# Patient Record
Sex: Female | Born: 1965 | Race: White | Hispanic: No | Marital: Married | Smoking: Never smoker
Health system: Southern US, Community
[De-identification: ages and names within clinical notes are randomized; demographics above are authoritative.]

## PROBLEM LIST (undated history)

## (undated) DIAGNOSIS — E785 Hyperlipidemia, unspecified: Secondary | ICD-10-CM

## (undated) DIAGNOSIS — A419 Sepsis, unspecified organism: Secondary | ICD-10-CM

## (undated) DIAGNOSIS — R32 Unspecified urinary incontinence: Secondary | ICD-10-CM

## (undated) DIAGNOSIS — F32A Depression, unspecified: Secondary | ICD-10-CM

## (undated) DIAGNOSIS — N3010 Interstitial cystitis (chronic) without hematuria: Secondary | ICD-10-CM

## (undated) DIAGNOSIS — F419 Anxiety disorder, unspecified: Secondary | ICD-10-CM

## (undated) DIAGNOSIS — F329 Major depressive disorder, single episode, unspecified: Secondary | ICD-10-CM

## (undated) DIAGNOSIS — M545 Low back pain, unspecified: Secondary | ICD-10-CM

## (undated) DIAGNOSIS — IMO0002 Reserved for concepts with insufficient information to code with codable children: Secondary | ICD-10-CM

## (undated) DIAGNOSIS — G43909 Migraine, unspecified, not intractable, without status migrainosus: Secondary | ICD-10-CM

## (undated) DIAGNOSIS — A499 Bacterial infection, unspecified: Secondary | ICD-10-CM

## (undated) DIAGNOSIS — Z8759 Personal history of other complications of pregnancy, childbirth and the puerperium: Secondary | ICD-10-CM

## (undated) DIAGNOSIS — N390 Urinary tract infection, site not specified: Secondary | ICD-10-CM

## (undated) DIAGNOSIS — M1990 Unspecified osteoarthritis, unspecified site: Secondary | ICD-10-CM

## (undated) HISTORY — PX: LASIK: SHX215

## (undated) HISTORY — DX: Major depressive disorder, single episode, unspecified: F32.9

## (undated) HISTORY — DX: Reserved for concepts with insufficient information to code with codable children: IMO0002

## (undated) HISTORY — DX: Interstitial cystitis (chronic) without hematuria: N30.10

## (undated) HISTORY — DX: Low back pain: M54.5

## (undated) HISTORY — DX: Urinary tract infection, site not specified: N39.0

## (undated) HISTORY — DX: Unspecified urinary incontinence: R32

## (undated) HISTORY — DX: Personal history of other complications of pregnancy, childbirth and the puerperium: Z87.59

## (undated) HISTORY — DX: Migraine, unspecified, not intractable, without status migrainosus: G43.909

## (undated) HISTORY — PX: DILATION AND CURETTAGE OF UTERUS: SHX78

## (undated) HISTORY — DX: Unspecified osteoarthritis, unspecified site: M19.90

## (undated) HISTORY — DX: Anxiety disorder, unspecified: F41.9

## (undated) HISTORY — PX: WISDOM TOOTH EXTRACTION: SHX21

## (undated) HISTORY — PX: REDUCTION MAMMAPLASTY: SUR839

## (undated) HISTORY — DX: Hyperlipidemia, unspecified: E78.5

## (undated) HISTORY — DX: Low back pain, unspecified: M54.50

## (undated) HISTORY — DX: Depression, unspecified: F32.A

## (undated) HISTORY — DX: Bacterial infection, unspecified: A49.9

---
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
## 1984-06-22 HISTORY — PX: BREAST SURGERY: SHX581

## 1988-06-22 HISTORY — PX: CERVICAL CONE BIOPSY: SUR198

## 1997-08-03 ENCOUNTER — Inpatient Hospital Stay (HOSPITAL_COMMUNITY): Admission: AD | Admit: 1997-08-03 | Discharge: 1997-08-04 | Payer: Self-pay | Admitting: Obstetrics and Gynecology

## 1998-01-03 ENCOUNTER — Other Ambulatory Visit: Admission: RE | Admit: 1998-01-03 | Discharge: 1998-01-03 | Payer: Self-pay | Admitting: Obstetrics and Gynecology

## 1998-07-04 ENCOUNTER — Encounter: Payer: Self-pay | Admitting: Obstetrics and Gynecology

## 1998-07-04 ENCOUNTER — Ambulatory Visit (HOSPITAL_COMMUNITY): Admission: RE | Admit: 1998-07-04 | Discharge: 1998-07-04 | Payer: Self-pay | Admitting: Obstetrics and Gynecology

## 1999-01-10 ENCOUNTER — Ambulatory Visit (HOSPITAL_COMMUNITY): Admission: RE | Admit: 1999-01-10 | Discharge: 1999-01-10 | Payer: Self-pay | Admitting: Gastroenterology

## 1999-01-17 ENCOUNTER — Other Ambulatory Visit: Admission: RE | Admit: 1999-01-17 | Discharge: 1999-01-17 | Payer: Self-pay | Admitting: Obstetrics and Gynecology

## 1999-01-19 ENCOUNTER — Encounter: Payer: Self-pay | Admitting: Obstetrics & Gynecology

## 1999-01-19 ENCOUNTER — Ambulatory Visit (HOSPITAL_COMMUNITY): Admission: RE | Admit: 1999-01-19 | Discharge: 1999-01-19 | Payer: Self-pay | Admitting: Obstetrics & Gynecology

## 1999-06-23 HISTORY — PX: LAPAROSCOPY: SHX197

## 1999-06-23 HISTORY — PX: HYSTEROSCOPY: SHX211

## 1999-08-14 ENCOUNTER — Encounter (INDEPENDENT_AMBULATORY_CARE_PROVIDER_SITE_OTHER): Payer: Self-pay

## 1999-08-14 ENCOUNTER — Ambulatory Visit (HOSPITAL_COMMUNITY): Admission: RE | Admit: 1999-08-14 | Discharge: 1999-08-14 | Payer: Self-pay | Admitting: Obstetrics & Gynecology

## 2000-10-07 ENCOUNTER — Other Ambulatory Visit: Admission: RE | Admit: 2000-10-07 | Discharge: 2000-10-07 | Payer: Self-pay | Admitting: Obstetrics and Gynecology

## 2000-10-20 ENCOUNTER — Encounter: Admission: RE | Admit: 2000-10-20 | Discharge: 2000-10-20 | Payer: Self-pay | Admitting: Obstetrics and Gynecology

## 2000-10-20 ENCOUNTER — Encounter: Payer: Self-pay | Admitting: Obstetrics and Gynecology

## 2001-01-03 ENCOUNTER — Encounter (INDEPENDENT_AMBULATORY_CARE_PROVIDER_SITE_OTHER): Payer: Self-pay | Admitting: Specialist

## 2001-01-03 ENCOUNTER — Other Ambulatory Visit: Admission: RE | Admit: 2001-01-03 | Discharge: 2001-01-03 | Payer: Self-pay | Admitting: Obstetrics and Gynecology

## 2001-01-05 ENCOUNTER — Encounter (INDEPENDENT_AMBULATORY_CARE_PROVIDER_SITE_OTHER): Payer: Self-pay

## 2001-01-05 ENCOUNTER — Ambulatory Visit (HOSPITAL_COMMUNITY): Admission: RE | Admit: 2001-01-05 | Discharge: 2001-01-05 | Payer: Self-pay | Admitting: Obstetrics and Gynecology

## 2001-04-26 ENCOUNTER — Encounter: Payer: Self-pay | Admitting: Internal Medicine

## 2001-04-26 ENCOUNTER — Encounter: Admission: RE | Admit: 2001-04-26 | Discharge: 2001-04-26 | Payer: Self-pay | Admitting: Internal Medicine

## 2001-06-22 HISTORY — PX: TUBAL LIGATION: SHX77

## 2001-07-26 ENCOUNTER — Other Ambulatory Visit: Admission: RE | Admit: 2001-07-26 | Discharge: 2001-07-26 | Payer: Self-pay | Admitting: Obstetrics and Gynecology

## 2001-12-29 ENCOUNTER — Encounter (HOSPITAL_COMMUNITY): Admission: AD | Admit: 2001-12-29 | Discharge: 2002-01-28 | Payer: Self-pay | Admitting: Obstetrics and Gynecology

## 2002-01-05 ENCOUNTER — Inpatient Hospital Stay (HOSPITAL_COMMUNITY): Admission: AD | Admit: 2002-01-05 | Discharge: 2002-01-05 | Payer: Self-pay | Admitting: Obstetrics and Gynecology

## 2002-01-20 ENCOUNTER — Encounter: Payer: Self-pay | Admitting: *Deleted

## 2002-01-20 ENCOUNTER — Inpatient Hospital Stay (HOSPITAL_COMMUNITY): Admission: AD | Admit: 2002-01-20 | Discharge: 2002-01-20 | Payer: Self-pay | Admitting: *Deleted

## 2002-02-02 ENCOUNTER — Inpatient Hospital Stay (HOSPITAL_COMMUNITY): Admission: AD | Admit: 2002-02-02 | Discharge: 2002-02-05 | Payer: Self-pay | Admitting: Obstetrics and Gynecology

## 2002-02-02 ENCOUNTER — Encounter (INDEPENDENT_AMBULATORY_CARE_PROVIDER_SITE_OTHER): Payer: Self-pay | Admitting: Specialist

## 2002-03-06 ENCOUNTER — Other Ambulatory Visit: Admission: RE | Admit: 2002-03-06 | Discharge: 2002-03-06 | Payer: Self-pay | Admitting: Obstetrics and Gynecology

## 2003-04-17 ENCOUNTER — Other Ambulatory Visit: Admission: RE | Admit: 2003-04-17 | Discharge: 2003-04-17 | Payer: Self-pay | Admitting: Obstetrics and Gynecology

## 2008-08-13 ENCOUNTER — Other Ambulatory Visit: Admission: RE | Admit: 2008-08-13 | Discharge: 2008-08-13 | Payer: Self-pay | Admitting: Family Medicine

## 2008-12-04 ENCOUNTER — Encounter: Admission: RE | Admit: 2008-12-04 | Discharge: 2008-12-04 | Payer: Self-pay | Admitting: Family Medicine

## 2010-01-21 ENCOUNTER — Encounter: Admission: RE | Admit: 2010-01-21 | Discharge: 2010-01-21 | Payer: Self-pay | Admitting: Obstetrics and Gynecology

## 2010-01-22 LAB — LIPID PANEL
CHOL/HDL RATIO, SERUM: 4
Cholesterol: 192 mg/dL (ref 0–200)
HDL: 48 mg/dL (ref 35–70)
LDL CALC: 128 mg/dL
Triglycerides: 82

## 2010-01-22 LAB — COMPLETE METABOLIC PANEL WITH GFR
ALBUMIN: 4.2
ALK PHOS: 98 U/L
ALT: 20 U/L (ref 7–35)
AST: 16 U/L
Albumin/Glob SerPl: 1.4
BILIRUBIN TOTAL: 0.5 mg/dL
BUN: 15 mg/dL (ref 4–21)
CARBON MONOXIDE, BLOOD: 25
CREATININE: 0.88
Calcium: 9 mg/dL
Chloride: 105 mmol/L
GFR, EST AFRICAN AMERICAN: 92
GFR, Est Non African American: 79
GLOBULIN: 3
Glucose: 90
Potassium: 4.2 mmol/L
Sodium: 139 mmol/L (ref 137–147)
Total Protein: 7.2 g/dL

## 2010-11-07 NOTE — Op Note (Signed)
[facility]  Patient:    Brenda Foster, Brenda Foster                      MRN: [id number] Proc. Date: 08/14/99 Adm. Date:  [id number] Attending:  Genia Del                           Operative Report  PREOPERATIVE DIAGNOSIS:       Repeat (second) missed abortion at 12+ weeks by last menstrual period, 9+ weeks by ultrasound today.  POSTOPERATIVE DIAGNOSIS:      Repeat (second) missed abortion at 12+ weeks by last menstrual period, 9+ weeks by ultrasound today.  OPERATION:                    Dilatation and evacuation by aspiration and curettage.  SURGEON:                      Genia Del, M.D.  ASSISTANT:  ANESTHESIA:                   Belva Agee, M.D.  ESTIMATED BLOOD LOSS:  DESCRIPTION OF PROCEDURE:     Under MAC the patient is in the lithotomy position. She is prepped with Betadine on the suprapubic, vulvar, and vaginal areas.  A bladder catheterization is done and the patient is draped as usual.  The vaginal examination reveals a closed cervix, long and firm.  The uterus is retroverted corresponding to about [redacted] weeks gestation.  No adnexal mass.  The speculum is introduced.  A paracervical block is done with lidocaine 1% at the level of the  uterosacral ligaments and at 4 and 8 oclock.  A total of 20 cc was injected. Then the Pozzi clamp is applied on the anterior lip of the cervix.  The dilatation is done easily with Hegar dilators up to #31.  The hysterometry was done before dilation and measured 10 cm.  Then we introduced the aspiration curet #10 curved. Curetting of the endometrial cavity brings back products of conception corresponding to about [redacted] weeks gestation.  Those are sent to genetics and pathology.  We then proceed to a systematic curettage with a sharp curet on all  endometrial surfaces.  We then finish by doing a last aspiration of the uterine  cavity.  The uterine sound is heard all over.  No more products of  conception are coming back and hemostasis is good.  The instruments are therefore removed. The vaginal examination after the procedure reveals a well-contracted uterus.  The estimated blood loss is about 50 cc.  No complications occurred.  The patients blood group was O positive, antibodies negative.  The patient was brought to the recovery room in good status. DD:  08/14/99 TD:  08/14/99 Job: [id number]

## 2010-11-07 NOTE — Op Note (Signed)
NAME:  Brenda Foster, Brenda Foster                       ACCOUNT NO.:  [id number]   MEDICAL RECORD NO.:  [id number]                   PATIENT TYPE:  INP   LOCATION:  9132                                 FACILITY:  [facility]   PHYSICIAN:  Sheronette A. Cherly Hensen, M.D.         DATE OF BIRTH:  01-08-1966   DATE OF PROCEDURE:  DATE OF DISCHARGE:                                 OPERATIVE REPORT   PREOPERATIVE DIAGNOSES:  1. Spontaneous rupture of membranes.  2. Twin gestation at 37 weeks.  3. Malpresentation of second twin.  4. Desires sterilization.   PROCEDURE:  1. Primary cesarean section.  2. Sharl Ma hysterotomy.  3. Modified Pomeroy tubal ligation.   POSTOPERATIVE DIAGNOSES:  1. Spontaneous rupture of membranes.  2. Vertex transverse presentation.  3. Desires sterilization.  4. Twin gestation at 37 weeks.   ANESTHESIA:  Spinal.   SURGEON:  Sheronette A. Cherly Hensen, M.D.   ASSISTANT:  Genia Del, M.D.   INDICATIONS FOR PROCEDURE:  This is a 45 year old  gravida 6, para 0-0-5-0  female at 62 weeks with twin gestation who presented with spontaneous  rupture of membranes and very early labor.  On ultrasound in maturity  admission the patient  was found to have a vertex and a transverse position  of the fetuses. Given the nonvertex presentation of the second twin decision  was made to proceed with a primary cesarean section. The patient also  desires permanent sterilization. Risks and benefits of the procedure have  been explained to the patient. Consent was signed. The patient  was  transferred to the operating room.   PROCEDURE:  Under adequate spinal anesthesia the patient was placed into the  supine position with a left lateral tilt. An indwelling Foley catheter was  placed sterilely. The patient was prepped and draped in the usual fashion. 8  cc of 0.25% Marcaine was injected along the planned Pfannenstiel incision. A  Pfannenstiel skin incision was then made and carried down to the  rectus  fascia using Bovie cautery. The rectus fascia was incised in midline and  extended bilaterally. The rectus fascia entered bluntly and with cautery  dissected off the rectus muscle in a superior and inferior fashion.  The  rectus muscles were spread in midline. The parietal peritoneum was entered  blunted and extended superiorly and inferiorly. The vesicouterine peritoneum  was then opened and extended transversely. The bladder was then bluntly  dissected off the lower uterine segment and removed from the operative field  using a Doyen retractor. A curvilinear low transverse uterine incision was  then made and extended bilaterally using bandage scissors. A clear amniotic  sac was noted and through that sac delivery of the first baby who is a live  female was accomplished. He was bulb suctioned in the abdomen and cord around  the neck times one was reduced and subsequently the baby was delivered. The  cord was clamped and cut. The baby was transferred to  the awaiting  pediatrician with Apgars of 8 and 9 at one and five minutes. His subsequent  weight was 7 pound 3 ounces. The second baby with intact membranes with  palpation was found to be lying on her left side and sort of tilted almost  onto her back. The feet were ultimately identified and the baby was brought  up into the field with the membranes still intact. Artificial rupture of  membranes were then performed. The baby was then subsequently delivered  using breech maneuver as a live female from a breech position. A cord around  the body times one was noted was reduced. The cord was clamped and cut. The  baby was transferred to the waiting pediatrician with Apgars 8 and 9 at one  and five minutes. Her weight was 6 pounds 5 ounces. The uterine cavity was  explored and anterior and posterior placenta was noted both of which were  removed. The cavity was then cleaned of debris. Uterine incision was closed  in two layers. The first  layer was a 0 Monocryl running lock stitch. The  second layer was imbricated using 0 Monocryl suture.  The uterus then  externalized. Small bleeding along the peritoneal edge was cauterized. Both  ovaries were normal. Both tubes are normal. Mid portion of the right  fallopian a tube was grasped with a Babcock. The mesosalpinx was opened  using cautery. The proximal and distal segment of the tube was tied with 0  chromic times two proximally and distally and the intervening segment of  fallopian tube was removed. The same procedure was performed on the  contralateral side. The uterus was returned to the abdomen. The abdomen was  irrigated and suctioned of debris. The tubes were then reinspected and  remained separated. The incision was again inspected and small bleeders  cauterized on the inferior aspect of the uterus. With good hemostasis  subsequently noted the vesicouterine peritoneum and the parietal peritoneum  were not closed. The rectus fascia was inspected. Small bleeders cauterized.  The rectus muscle was carefully inspected. No bleeders noted. The rectus  fascia was closed with 0 Vicryl times two. The subsequent area was  irrigated. No bleeders noted. The skin was approximated using Ethicon  staples. Sponge and instrument count on two was correct .The patient  tolerated the procedure well and was transferred to the recovery room in  stable condition.   SPECIMENS:  1. Both placentas.  2. Portion of the right and left fallopian tube.   ESTIMATED BLOOD LOSS:  800 cc.   INTRAOPERATIVE FLUID:  Two liters.   URINE OUTPUT:  20 cc clear yellow urine.   COMPLICATIONS:  None.                                                 Sheronette A. Cherly Hensen, M.D.    SAC/MEDQ  D:  02/02/2002  T:  02/03/2002  Job:  [id number]

## 2010-11-07 NOTE — Op Note (Signed)
[facility]  Patient:    Brenda Foster, Brenda Foster                    MRN: [id number] Proc. Date: 01/05/01 Adm. Date:  [id number] Attending:  Maxie Better                           Operative Report  PREOPERATIVE DIAGNOSES:       1. Incomplete spontaneous abortion.                               2. Recurrent pregnancy loses.  POSTOPERATIVE DIAGNOSES:      1. Incomplete spontaneous abortion                               2. Recurrent pregnancy loses.  OPERATION:                    Dilatation and evacuation.  SURGEON:                      Sheronette A. Cherly Hensen, M.D.  ANESTHESIA:                   MAC paracervical block.  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  The patient has been having only vaginal spotting since that time.  The risks and benefits of the procedure have been explained to the patient. Consent was signed, and the patient was transferred to the operating room.  DESCRIPTION OF PROCEDURE:  Under adequate monitored anesthesia, the patient was placed in the dorsolithotomy position.  She was sterilely prepped and draped in the usual fashion.  The patient was catheterized for a small amount of urine.  Examination under anesthesia revealed a retroverted uterus.  No adnexal masses could be appreciated.  The patient was sterilely prepped and draped.  Bivalve speculum was placed in the vagina, and 19.5 cc of 1% ______ was injected paracervically.  The cervix was grasped with a single-tooth tenaculum.  The cervix easily accepted a #25 Pratt dilator, and a 5 mm curved suction cannula was introduced into the uterine cavity without difficulty. Small amount of tissue was obtained.  The  cavity was curetted, resuctioned, and all tissue was felt to have been removed.  All instruments were removed from the vagina.  Specimen labeled, "Products of Conception" was sent to pathology.  Estimated blood loss was minimal.  Maternal blood type is 0 positive.  Complications: None.  The patient tolerated the procedure well and was transferred to the recovery room in stable condition. DD:  01/05/01 TD:  01/05/01 Job: [id number]

## 2010-11-07 NOTE — Discharge Summary (Signed)
NAME:  Brenda Foster, Brenda Foster                       ACCOUNT NO.:  [id number]   MEDICAL RECORD NO.:  [id number]                   PATIENT TYPE:  INP   LOCATION:  9132                                 FACILITY:  [facility]   PHYSICIAN:  Sheronette A. Cherly Hensen, M.D.         DATE OF BIRTH:  04-08-1966   DATE OF ADMISSION:  02/02/2002  DATE OF DISCHARGE:  02/05/2002                                 DISCHARGE SUMMARY   ADMISSION DIAGNOSES:  1. Spontaneous rupture of membranes.  2. Twin gestation at 37 weeks.  3. Malpresentation of second twin.  4. Desires permanent sterilization.   DISCHARGE DIAGNOSES:  1. Twin gestation 37 weeks delivered.  2. Desires sterilization.  3. Spontaneous rupture of membranes.  4. Vertex transverse presentation.   PROCEDURE:  1. Primary cesarean section.  2. Modified Pomeroy tubal ligation.   HISTORY OF PRESENT ILLNESS:  A 45 year old gravida 6, para 0-0-5-0 female  with twin gestation at 85 weeks presented with spontaneous rupture of  membranes in early labor.  Second twin was transverse position. Decision was  made to proceed with a primary cesarean section as well as permanent  sterilization.   HOSPITAL COURSE:  The patient was admitted.  She was taken to the operating  room where she underwent a primary cesarean section via low transverse  uterine incision.  The procedure resulted in delivery of a 7 pound 3 ounce  live female with cord around the neck times one with Apgars of 8 and 9,  anterior placenta.  Twin B live female six pounds 5 ounces with Apgars 8 and  9, posterior placenta.  Both tubes and ovaries are normal.  A portion of the  right and left fallopian tube was sent to pathology and confirmed as such on  pathology. The patient had an unremarkable postoperative course.  She was  tolerating a regular diet and passed flatus.  Her CBC on postop day #1  showed a hemoglobin 9.1, hematocrit 26.4, white count 11.1, platelet count  144,000.  Having remained  afebrile, the incision showed no erythema,  induration or __________, the decision was made to discharge the patient.   DISPOSITION:  Home.   CONDITION ON DISCHARGE:  Stable.   DISCHARGE MEDICATIONS:  1. Prenatal vitamins one p.o. q.d.  2. Percocet #30 one p.o. q.3-4h. pain.  3. Motrin 600 mg one p.o. q.6-8h. p.r.n. pain.   FOLLOW UP:  Follow up appointment in four weeks [facility].   DISCHARGE INSTRUCTIONS:  Call if temperature greater or equal to 100.4.  Nothing in vagina for four to six weeks.  No heavy lifting or driving for  two weeks.  Call if incisional bleeding, redness, or increase in incisional  pain and soaking a regular pad every hour or more frequently, severe  abdominal pain, nausea, vomiting.  Sheronette A. Cherly Hensen, M.D.    SAC/MEDQ  D:  03/10/2002  T:  03/13/2002  Job:  [id number] [id number]

## 2010-11-07 NOTE — H&P (Signed)
[place]                          ACCOUNT NO.:  [id number]   MEDICAL RECORD NO.:  [id number]                   PATIENT TYPE:   LOCATION:                                       FACILITY:   PHYSICIAN:  Genia Del, M.D.             DATE OF BIRTH:  08/25/1965   DATE OF ADMISSION:  02/02/2002  DATE OF DISCHARGE:                                HISTORY & PHYSICAL   IDENTIFICATION:  Brenda Foster is a 45 year old G6, P0, A5, last menstrual  period May 19, 2001, at 43 weeks' gestation with an expected date of  delivery on February 23, 2002.  This is a twin gestation by amniotic  dichorionic by IVF.   REASON FOR ADMISSION:  Clear fluid leak x 1 p.m. on February 02, 2002.   HISTORY OF PRESENT ILLNESS:  The patient started feeling leak vaginally.  She had a small amount of constant leak x 1 p.m. on February 02, 2002.  She  felt irregular mild uterine contractions.  The fluid became slightly  pinkish.  She had good fetal movements x 2.  No PIH symptoms.   PAST MEDICAL HISTORY:  Negative.   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  She had five pregnancy losses in the first  trimester from 1999 to 2002.  She had four D&E without complications.   PAST GYNECOLOGIC HISTORY:  Positive for a cone biopsy in 1991.   PAST SURGICAL HISTORY:  Positive for breast reduction in 1986 and  appendectomy in 1981.   ALLERGIES:  SULFA - rash.   MEDICATIONS:  1. Prenatal vitamins.  2. Iron supplements.   SOCIAL HISTORY:  Married.  Nonsmoker.   HISTORY OF PRESENT PREGNANCY:  The patient had an IVF at [facility].  She was put on progesterone for the first trimester as well as estrogen  patch.  She had her new OB visit at 9+ weeks.  The ultrasound showed  dichorionic diamniotic twins at 9+ weeks.  Fetal heart rate was normal for  both.  At 11+ weeks, she presented with vaginal bleeding.  An ultrasound  showed that  both twins were fine.  Fetal heart rates were 172 and 165.  The  placenta was posterior, partial previa for one and the other one was  placenta anterior normal.  In the second trimester, at 13+ weeks, an another  ultrasound was done showing that the placental previa was just now just low  lying, still no subchorionic hematoma.  An ultrasound was repeated at 15+  weeks and 18+ weeks.  The placenta was still low lying at 18+ weeks.  At her  20 week ultrasound, review of anatomy was within normal for normal.  The  posterior placenta was partial previa.  In the second trimester, an  ultrasound done at 24+ weeks showed resolved placenta previa.  Concordant  growth was obtained at the 68th percentile  and 44th percentile.  At 25+  weeks, the cervix was closed and long, high presentation.  At 28+ weeks,  interval growth was good at the 71st and 55th percentile.  One hour GTT was  within normal limits.  Ultrasound showed a good interval growth until the  end of her pregnancy.  Group B strep was negative and blood pressures  remained normal throughout pregnancy.   REVIEW OF SYMPTOMS:  Constitutional:  Negative.  HEENT:  Negative.  Cardiovascular, respiratory, GI, urological negative.  Endocrinology,  neurological and dermatological negative.   PHYSICAL EXAMINATION:  GENERAL:  No apparent distress.  VITAL SIGNS:  Blood pressure 138/83, pulse 81 and regular, temperature 99.2,  respiratory rate 20.  O2 saturation 99%.  LUNGS:  Clear bilaterally.  HEART:  Regular cardiac rhythm.  No murmurs.  ABDOMEN:  Rhett Bannister, first baby is in cephalic presentation.  PELVIC:  Crist Fat is positive with clear fluid.  Nitrazine was also positive.  Cervix is 2+, 90%, vertex -1, fixed in pelvis.  Lower limbs are normal.  Fetal heart baby A 130-140, baby B 135-145.   IMPRESSION:  G6, P0, A5 at 37 weeks' twin gestation, dichorionic, diamniotic  with stunted structure of membranes.  First baby in cephalic presentation,  not  in labor currently.  Group B strep negative.   PLAN:  Admit to labor and delivery [facility].  Monitor.  Re-evaluate  presentation.  Dr. Cherly Hensen informed of the patient's admission.                                               Genia Del, M.D.    ML/MEDQ  D:  02/02/2002  T:  02/02/2002  Job:  [id number]

## 2011-01-19 LAB — TSH: TSH: 1.17

## 2011-01-22 ENCOUNTER — Other Ambulatory Visit: Payer: Self-pay | Admitting: Obstetrics and Gynecology

## 2011-01-22 DIAGNOSIS — Z1231 Encounter for screening mammogram for malignant neoplasm of breast: Secondary | ICD-10-CM

## 2011-03-02 ENCOUNTER — Ambulatory Visit
Admission: RE | Admit: 2011-03-02 | Discharge: 2011-03-02 | Disposition: A | Discharge status: Home / Self Care | Payer: Private Health Insurance - Indemnity | Source: Ambulatory Visit | Attending: Obstetrics and Gynecology | Admitting: Obstetrics and Gynecology

## 2011-03-02 DIAGNOSIS — Z1231 Encounter for screening mammogram for malignant neoplasm of breast: Secondary | ICD-10-CM

## 2011-06-23 LAB — HM PAP SMEAR: HM Pap smear: NORMAL

## 2012-04-18 ENCOUNTER — Other Ambulatory Visit: Payer: Self-pay | Admitting: Obstetrics and Gynecology

## 2012-04-18 DIAGNOSIS — Z9889 Other specified postprocedural states: Secondary | ICD-10-CM

## 2012-04-18 DIAGNOSIS — Z1231 Encounter for screening mammogram for malignant neoplasm of breast: Secondary | ICD-10-CM

## 2012-05-27 ENCOUNTER — Ambulatory Visit
Admission: RE | Admit: 2012-05-27 | Discharge: 2012-05-27 | Disposition: A | Discharge status: Home / Self Care | Payer: Private Health Insurance - Indemnity | Source: Ambulatory Visit | Attending: Obstetrics and Gynecology | Admitting: Obstetrics and Gynecology

## 2012-05-27 DIAGNOSIS — Z9889 Other specified postprocedural states: Secondary | ICD-10-CM

## 2012-05-27 DIAGNOSIS — Z1231 Encounter for screening mammogram for malignant neoplasm of breast: Secondary | ICD-10-CM

## 2012-12-22 ENCOUNTER — Ambulatory Visit (INDEPENDENT_AMBULATORY_CARE_PROVIDER_SITE_OTHER): Payer: Private Health Insurance - Indemnity | Admitting: Nurse Practitioner

## 2012-12-22 ENCOUNTER — Encounter: Payer: Self-pay | Admitting: Nurse Practitioner

## 2012-12-22 VITALS — BP 120/80 | HR 62 | Temp 98.6°F | Ht 66.5 in | Wt 165.8 lb

## 2012-12-22 DIAGNOSIS — K30 Functional dyspepsia: Secondary | ICD-10-CM

## 2012-12-22 DIAGNOSIS — R1013 Epigastric pain: Secondary | ICD-10-CM

## 2012-12-22 DIAGNOSIS — K3189 Other diseases of stomach and duodenum: Secondary | ICD-10-CM

## 2012-12-22 DIAGNOSIS — Z8659 Personal history of other mental and behavioral disorders: Secondary | ICD-10-CM

## 2012-12-22 HISTORY — DX: Personal history of other mental and behavioral disorders: Z86.59

## 2012-12-22 NOTE — Progress Notes (Signed)
Subjective:       Brenda Foster is a 47 y.o. female who presents for evaluation of abdominal upset. Onset was 2 months ago. Symptoms have been unchanged. The pain is described as colicky and cramping, and is 3/10 in intensity. Pain is located in the diffuse abdomen without radiation.  Aggravating factors: bowel movement.  Alleviating factors: eating less food. Associated symptoms: belching, flatus and loud gurgling. The patient denies chills, constipation, dysuria, fever, headache, hematochezia, melena, myalgias, nausea, sweats and vomiting.  The patient's history has been marked as reviewed and updated as appropriate.  Review of Systems Constitutional: positive for weight loss and 5 lb. in 3 weeks, negative for chills, fatigue, fevers and night sweats Ears, nose, mouth, throat, and face: negative for sore throat Respiratory: negative for asthma, cough and dyspnea on exertion Cardiovascular: negative for chest pain, fatigue and irregular heart beat Gastrointestinal: positive for abdominal pain, change in bowel habits and anorexia, negative for constipation, diarrhea, dyspepsia, dysphagia, jaundice, melena, nausea, odynophagia, reflux symptoms and vomiting Genitourinary:negative for dysuria and frequency Integument/breast: negative for rash Musculoskeletal:negative for arthralgias Neurological: negative for coordination problems, dizziness, headaches and weakness Behavioral/Psych: positive for anxiety, negative for abusive relationship, aggressive behavior, depression, excessive alcohol consumption, illegal drug usage, irritability and sleep disturbance Endocrine: negative for diabetic symptoms including polydipsia, polyphagia and polyuria and temperature intolerance Allergic/Immunologic: negative for food allergies     Objective:    BP 120/80  Pulse 62  Temp(Src) 98.6 F (37 C) (Oral)  Ht 5' 6.5" (1.689 m)  Wt 165 lb 12 oz (75.184 kg)  BMI 26.36 kg/m2  SpO2 98%  LMP  12/22/2012 General appearance: alert, cooperative, appears stated age and no distress Head: Normocephalic, without obvious abnormality, atraumatic Eyes: negative findings: lids and lashes normal, conjunctivae and sclerae normal, corneas clear and pupils equal, round, reactive to light and accomodation Throat: lips, mucosa, and tongue normal; teeth and gums normal Lungs: clear to auscultation bilaterally Heart: regular rate and rhythm, S1, S2 normal, no murmur, click, rub or gallop Abdomen: soft, non-tender; bowel sounds normal; no masses,  no organomegaly Extremities: extremities normal, atraumatic, no cyanosis or edema Lymph nodes: Cervical, supraclavicular, and axillary nodes normal.    Assessment:    Abdominal pain, likely secondary to food allergy, perhaps lactose intolerance .    Plan:    Reassured patient that symptoms are almost certainly benign and self-resolving. Follow up in 2 weeks or as needed. Follow up as needed. eliminate dairy from diet, then re-evaluate.

## 2012-12-22 NOTE — Patient Instructions (Addendum)
Eliminate all dairy from diet. Watch for hidden sources of dairy in processed food-"milk solids". Let's re-evaluate in 2 weeks. If any new symptoms develop, or you feel worse, please call us sooner. Pleasure to meet you!

## 2013-04-27 ENCOUNTER — Other Ambulatory Visit: Payer: Self-pay

## 2013-07-31 ENCOUNTER — Encounter: Payer: Self-pay | Admitting: Nurse Practitioner

## 2013-07-31 ENCOUNTER — Ambulatory Visit (INDEPENDENT_AMBULATORY_CARE_PROVIDER_SITE_OTHER): Payer: BC Managed Care – PPO | Admitting: Nurse Practitioner

## 2013-07-31 VITALS — BP 120/78 | HR 64 | Temp 97.7°F | Resp 18 | Ht 66.5 in | Wt 164.0 lb

## 2013-07-31 DIAGNOSIS — J069 Acute upper respiratory infection, unspecified: Secondary | ICD-10-CM

## 2013-07-31 NOTE — Progress Notes (Signed)
Pre visit review using our clinic review tool, if applicable. No additional management support is needed unless otherwise documented below in the visit note. 

## 2013-07-31 NOTE — Patient Instructions (Signed)
You have a virus causing your symptoms. The average duration of respiratory viral illness is 14 days. Start daily sinus rinses (neilmed Sinus Rinse). Use 30 mg to 60 mg pseudoephedrine twice daily. Sip fluids every hour. Rest. If you are not feeling better in 1 week or develop fever or chest pain, call us for re-evaluation. Feel better!  Upper Respiratory Infection, Adult An upper respiratory infection (URI) is also sometimes known as the common cold. The upper respiratory tract includes the nose, sinuses, throat, trachea, and bronchi. Bronchi are the airways leading to the lungs. Most people improve within 1 week, but symptoms can last up to 2 weeks. A residual cough may last even longer.  CAUSES Many different viruses can infect the tissues lining the upper respiratory tract. The tissues become irritated and inflamed and often become very moist. Mucus production is also common. A cold is contagious. You can easily spread the virus to others by oral contact. This includes kissing, sharing a glass, coughing, or sneezing. Touching your mouth or nose and then touching a surface, which is then touched by another person, can also spread the virus. SYMPTOMS  Symptoms typically develop 1 to 3 days after you come in contact with a cold virus. Symptoms vary from person to person. They may include:  Runny nose.  Sneezing.  Nasal congestion.  Sinus irritation.  Sore throat.  Loss of voice (laryngitis).  Cough.  Fatigue.  Muscle aches.  Loss of appetite.  Headache.  Low-grade fever. DIAGNOSIS  You might diagnose your own cold based on familiar symptoms, since most people get a cold 2 to 3 times a year. Your caregiver can confirm this based on your exam. Most importantly, your caregiver can check that your symptoms are not due to another disease such as strep throat, sinusitis, pneumonia, asthma, or epiglottitis. Blood tests, throat tests, and X-rays are not necessary to diagnose a common cold,  but they may sometimes be helpful in excluding other more serious diseases. Your caregiver will decide if any further tests are required. RISKS AND COMPLICATIONS  You may be at risk for a more severe case of the common cold if you smoke cigarettes, have chronic heart disease (such as heart failure) or lung disease (such as asthma), or if you have a weakened immune system. The very young and very old are also at risk for more serious infections. Bacterial sinusitis, middle ear infections, and bacterial pneumonia can complicate the common cold. The common cold can worsen asthma and chronic obstructive pulmonary disease (COPD). Sometimes, these complications can require emergency medical care and may be life-threatening. PREVENTION  The best way to protect against getting a cold is to practice good hygiene. Avoid oral or hand contact with people with cold symptoms. Wash your hands often if contact occurs. There is no clear evidence that vitamin C, vitamin E, echinacea, or exercise reduces the chance of developing a cold. However, it is always recommended to get plenty of rest and practice good nutrition. TREATMENT  Treatment is directed at relieving symptoms. There is no cure. Antibiotics are not effective, because the infection is caused by a virus, not by bacteria. Treatment may include:  Increased fluid intake. Sports drinks offer valuable electrolytes, sugars, and fluids.  Breathing heated mist or steam (vaporizer or shower).  Eating chicken soup or other clear broths, and maintaining good nutrition.  Getting plenty of rest.  Using gargles or lozenges for comfort.  Controlling fevers with ibuprofen or acetaminophen as directed by your caregiver.  Increasing usage of your inhaler if you have asthma. Zinc gel and zinc lozenges, taken in the first 24 hours of the common cold, can shorten the duration and lessen the severity of symptoms. Pain medicines may help with fever, muscle aches, and throat  pain. A variety of non-prescription medicines are available to treat congestion and runny nose. Your caregiver can make recommendations and may suggest nasal or lung inhalers for other symptoms.  HOME CARE INSTRUCTIONS   Only take over-the-counter or prescription medicines for pain, discomfort, or fever as directed by your caregiver.  Use a warm mist humidifier or inhale steam from a shower to increase air moisture. This may keep secretions moist and make it easier to breathe.  Drink enough water and fluids to keep your urine clear or pale yellow.  Rest as needed.  Return to work when your temperature has returned to normal or as your caregiver advises. You may need to stay home longer to avoid infecting others. You can also use a face mask and careful hand washing to prevent spread of the virus. SEEK MEDICAL CARE IF:   After the first few days, you feel you are getting worse rather than better.  You need your caregiver's advice about medicines to control symptoms.  You develop chills, worsening shortness of breath, or brown or red sputum. These may be signs of pneumonia.  You develop yellow or brown nasal discharge or pain in the face, especially when you bend forward. These may be signs of sinusitis.  You develop a fever, swollen neck glands, pain with swallowing, or white areas in the back of your throat. These may be signs of strep throat. SEEK IMMEDIATE MEDICAL CARE IF:   You have a fever.  You develop severe or persistent headache, ear pain, sinus pain, or chest pain.  You develop wheezing, a prolonged cough, cough up blood, or have a change in your usual mucus (if you have chronic lung disease).  You develop sore muscles or a stiff neck. Document Released: 12/02/2000 Document Revised: 08/31/2011 Document Reviewed: 10/10/2010 [facility] Patient Information 2014 [place], [place].

## 2013-07-31 NOTE — Progress Notes (Signed)
   Subjective:    Patient ID: Brenda Foster, female    DOB: 04-14-1966, 48 y.o.   MRN: [id number]  URI  This is a new problem. The current episode started 1 to 4 weeks ago (1 wk). The problem has been unchanged. There has been no fever. Associated symptoms include chest pain (chest feels tight), congestion (nasal), coughing, headaches and a sore throat (scratchy). Pertinent negatives include no abdominal pain, diarrhea, ear pain, joint pain, nausea, swollen glands, vomiting or wheezing. She has tried antihistamine (dayquil) for the symptoms. The treatment provided mild relief.      Review of Systems  Constitutional: Positive for fatigue. Negative for fever, chills, activity change and appetite change.  HENT: Positive for congestion (nasal), postnasal drip and sore throat (scratchy). Negative for ear pain.   Respiratory: Positive for cough and chest tightness. Negative for shortness of breath and wheezing.   Cardiovascular: Positive for chest pain (chest feels tight).  Gastrointestinal: Negative for nausea, vomiting, abdominal pain and diarrhea.  Musculoskeletal: Negative for arthralgias, back pain and joint pain.  Neurological: Positive for headaches.       Objective:   Physical Exam  Vitals reviewed. Constitutional: She is oriented to person, place, and time. She appears well-developed and well-nourished.  HENT:  Head: Normocephalic and atraumatic.  Right Ear: External ear normal.  Left Ear: External ear normal.  Mouth/Throat: Oropharynx is clear and moist. No oropharyngeal exudate.  Bilateral effusions, clear fluid, no TM bulge, bones visible.  Eyes: Conjunctivae are normal. Right eye exhibits no discharge. Left eye exhibits no discharge.  Neck: Normal range of motion. Neck supple. No thyromegaly present.  Cardiovascular: Normal rate, regular rhythm and normal heart sounds.   No murmur heard. Pulmonary/Chest: Effort normal and breath sounds normal. No respiratory distress. She  has no wheezes. She has no rales.  Lymphadenopathy:    She has no cervical adenopathy.  Neurological: She is alert and oriented to person, place, and time.  Skin: Skin is warm and dry.  Psychiatric: She has a normal mood and affect. Her behavior is normal. Thought content normal.          Assessment & Plan:   1. Upper respiratory infection Nasal congestion, cough, no fever. See pt instructions.

## 2013-09-15 ENCOUNTER — Encounter: Payer: Self-pay | Admitting: Nurse Practitioner

## 2013-09-15 ENCOUNTER — Ambulatory Visit (INDEPENDENT_AMBULATORY_CARE_PROVIDER_SITE_OTHER): Payer: BC Managed Care – PPO | Admitting: Nurse Practitioner

## 2013-09-15 VITALS — BP 120/80 | HR 69 | Temp 97.9°F | Ht 66.5 in | Wt 165.2 lb

## 2013-09-15 DIAGNOSIS — R300 Dysuria: Secondary | ICD-10-CM

## 2013-09-15 LAB — POCT URINALYSIS DIPSTICK
Bilirubin, UA: NEGATIVE
Glucose, UA: NEGATIVE
Ketones, UA: NEGATIVE
Leukocytes, UA: NEGATIVE
Nitrite, UA: NEGATIVE
PROTEIN UA: NEGATIVE
RBC UA: NEGATIVE
SPEC GRAV UA: 1.025
UROBILINOGEN UA: 0.2
pH, UA: 6

## 2013-09-15 MED ORDER — PHENAZOPYRIDINE HCL 200 MG PO TABS: 200.0000 mg | ORAL_TABLET | Freq: Three times a day (TID) | ORAL | Status: DC | PRN

## 2013-09-15 MED ORDER — CIPROFLOXACIN HCL 250 MG PO TABS: 250.0000 mg | ORAL_TABLET | Freq: Two times a day (BID) | ORAL | Status: DC

## 2013-09-15 NOTE — Patient Instructions (Signed)
Start antibiotic. Our office will call if we need to change the antibiotic. Take pyridium to relax bladder, caution: urine tears & sweat will be orange. Do not be alarmed! Sip hydrating fluids (water, juice, colorless soda, decaff tea) every hour to flush kidneys. Return in 1 month to recheck urine or sooner if symptoms do not improve or you feel worse.  Urinary Tract Infection Urinary tract infections (UTIs) can develop anywhere along your urinary tract. Your urinary tract is your body's drainage system for removing wastes and extra water. Your urinary tract includes two kidneys, two ureters, a bladder, and a urethra. Your kidneys are a pair of bean-shaped organs. Each kidney is about the size of your fist. They are located below your ribs, one on each side of your spine. CAUSES Infections are caused by microbes, which are microscopic organisms, including fungi, viruses, and bacteria. These organisms are so small that they can only be seen through a microscope. Bacteria are the microbes that most commonly cause UTIs. SYMPTOMS  Symptoms of UTIs may vary by age and gender of the patient and by the location of the infection. Symptoms in young women typically include a frequent and intense urge to urinate and a painful, burning feeling in the bladder or urethra during urination. Older women and men are more likely to be tired, shaky, and weak and have muscle aches and abdominal pain. A fever may mean the infection is in your kidneys. Other symptoms of a kidney infection include pain in your back or sides below the ribs, nausea, and vomiting. DIAGNOSIS To diagnose a UTI, your caregiver will ask you about your symptoms. Your caregiver also will ask to provide a urine sample. The urine sample will be tested for bacteria and white blood cells. White blood cells are made by your body to help fight infection. TREATMENT  Typically, UTIs can be treated with medication. Because most UTIs are caused by a bacterial  infection, they usually can be treated with the use of antibiotics. The choice of antibiotic and length of treatment depend on your symptoms and the type of bacteria causing your infection. HOME CARE INSTRUCTIONS  If you were prescribed antibiotics, take them exactly as your caregiver instructs you. Finish the medication even if you feel better after you have only taken some of the medication.  Drink enough water and fluids to keep your urine clear or pale yellow.  Avoid caffeine, tea, and carbonated beverages. They tend to irritate your bladder.  Empty your bladder often. Avoid holding urine for long periods of time.  Empty your bladder before and after sexual intercourse.  After a bowel movement, women should cleanse from front to back. Use each tissue only once. SEEK MEDICAL CARE IF:   You have back pain.  You develop a fever.  Your symptoms do not begin to resolve within 3 days. SEEK IMMEDIATE MEDICAL CARE IF:   You have severe back pain or lower abdominal pain.  You develop chills.  You have nausea or vomiting.  You have continued burning or discomfort with urination. MAKE SURE YOU:   Understand these instructions.  Will watch your condition.  Will get help right away if you are not doing well or get worse. Document Released: 03/18/2005 Document Revised: 12/08/2011 Document Reviewed: 07/17/2011 [employer] Patient Information 2014 [employer].  

## 2013-09-15 NOTE — Progress Notes (Signed)
Pre visit review using our clinic review tool, if applicable. No additional management support is needed unless otherwise documented below in the visit note. 

## 2013-09-15 NOTE — Progress Notes (Signed)
   Subjective:    Patient ID: Brenda Foster, female    DOB: April 18, 1966, 48 y.o.   MRN: [id number]  Urinary Tract Infection  This is a new problem. The current episode started in the past 7 days (6d). The problem occurs every urination. The problem has been gradually worsening. The quality of the pain is described as burning. The pain is mild. There has been no fever. She is sexually active. There is no history of pyelonephritis. Associated symptoms include urgency. Pertinent negatives include no chills, discharge, flank pain, frequency, hematuria, hesitancy, nausea or vomiting. Associated symptoms comments: dysuruia. She has tried nothing for the symptoms. There is no history of recurrent UTIs.      Review of Systems  Constitutional: Negative for fever, chills, activity change, appetite change and fatigue.  Gastrointestinal: Negative for nausea, vomiting and abdominal pain.  Genitourinary: Positive for dysuria and urgency. Negative for hesitancy, frequency, hematuria, flank pain and vaginal discharge.  Musculoskeletal: Negative for back pain.       Objective:   Physical Exam  Vitals reviewed. Constitutional: She is oriented to person, place, and time. She appears well-developed and well-nourished. No distress.  HENT:  Head: Normocephalic and atraumatic.  Eyes: Conjunctivae are normal. Right eye exhibits no discharge. Left eye exhibits no discharge.  Cardiovascular: Normal rate.   Pulmonary/Chest: Effort normal. No respiratory distress.  Abdominal: Soft. Bowel sounds are normal. She exhibits no distension and no mass. There is no tenderness. There is no rebound and no guarding.  Musculoskeletal: She exhibits no tenderness (no CVA tenderness).  Neurological: She is alert and oriented to person, place, and time.  Skin: Skin is warm and dry.  Psychiatric: She has a normal mood and affect. Her behavior is normal. Thought content normal.          Assessment & Plan:   1. Dysuria,  urgency X 5 d Afebrile, no flank pain or nausea - POCT urinalysis dipstick- nml. Will treat based on symptoms. - ciprofloxacin (CIPRO) 250 MG tablet; Take 1 tablet (250 mg total) by mouth 2 (two) times daily.  Dispense: 6 tablet; Refill: 0 - phenazopyridine (PYRIDIUM) 200 MG tablet; Take 1 tablet (200 mg total) by mouth 3 (three) times daily as needed for pain.  Dispense: 6 tablet; Refill: 0 - Urine culture-pending

## 2013-09-18 LAB — URINE CULTURE: Colony Count: >=100000

## 2013-10-12 ENCOUNTER — Ambulatory Visit (INDEPENDENT_AMBULATORY_CARE_PROVIDER_SITE_OTHER): Payer: BC Managed Care – PPO | Admitting: Nurse Practitioner

## 2013-10-12 ENCOUNTER — Encounter: Payer: Self-pay | Admitting: Nurse Practitioner

## 2013-10-12 VITALS — BP 141/90 | HR 60 | Temp 98.6°F | Ht 66.5 in | Wt 159.2 lb

## 2013-10-12 DIAGNOSIS — R059 Cough, unspecified: Secondary | ICD-10-CM | POA: Insufficient documentation

## 2013-10-12 DIAGNOSIS — N393 Stress incontinence (female) (male): Secondary | ICD-10-CM

## 2013-10-12 DIAGNOSIS — R05 Cough: Secondary | ICD-10-CM

## 2013-10-12 MED ORDER — BENZONATATE 100 MG PO CAPS: 200.0000 mg | ORAL_CAPSULE | Freq: Three times a day (TID) | ORAL | Status: DC

## 2013-10-12 NOTE — Progress Notes (Signed)
Pre visit review using our clinic review tool, if applicable. No additional management support is needed unless otherwise documented below in the visit note. 

## 2013-10-12 NOTE — Patient Instructions (Signed)
It is likely that you are coughing due to post-viral inflammatory response. It may take another 2 weeks to resolve. You may need another prednisone taper. First let's try cough drops/throat lozenges with menthol & benzocaine. Hot tea & honey. Blow nose when cough as there may be a scant amount of post nasal drip. Continue to use benzonatate capsules. Let me know if cough isn't getting better within the next week or so. Kegel kegel kegel!!!  Cough, Adult  A cough is a reflex that helps clear your throat and airways. It can help heal the body or may be a reaction to an irritated airway. A cough may only last 2 or 3 weeks (acute) or may last more than 8 weeks (chronic).  CAUSES Acute cough:  Viral or bacterial infections. Chronic cough:  Infections.  Allergies.  Asthma.  Post-nasal drip.  Smoking.  Heartburn or acid reflux.  Some medicines.  Chronic lung problems (COPD).  Cancer. SYMPTOMS   Cough.  Fever.  Chest pain.  Increased breathing rate.  High-pitched whistling sound when breathing (wheezing).  Colored mucus that you cough up (sputum). TREATMENT   A bacterial cough may be treated with antibiotic medicine.  A viral cough must run its course and will not respond to antibiotics.  Your caregiver may recommend other treatments if you have a chronic cough. HOME CARE INSTRUCTIONS   Only take over-the-counter or prescription medicines for pain, discomfort, or fever as directed by your caregiver. Use cough suppressants only as directed by your caregiver.  Use a cold steam vaporizer or humidifier in your bedroom or home to help loosen secretions.  Sleep in a semi-upright position if your cough is worse at night.  Rest as needed.  Stop smoking if you smoke. SEEK IMMEDIATE MEDICAL CARE IF:   You have pus in your sputum.  Your cough starts to worsen.  You cannot control your cough with suppressants and are losing sleep.  You begin coughing up blood.  You  have difficulty breathing.  You develop pain which is getting worse or is uncontrolled with medicine.  You have a fever. MAKE SURE YOU:   Understand these instructions.  Will watch your condition.  Will get help right away if you are not doing well or get worse. Document Released: 12/05/2010 Document Revised: 08/31/2011 Document Reviewed: 12/05/2010 [facility] Patient Information 2014 [place].

## 2013-10-12 NOTE — Progress Notes (Signed)
   Subjective:    Patient ID: Brenda Foster, female    DOB: [birth year], 48 y.o.   MRN: [id number]  Cough This is a chronic problem. The current episode started 1 to 4 weeks ago (3 weeks). The problem occurs hourly. The cough is non-productive. Associated symptoms include a sore throat (resolved) and weight loss (coughs more after eating). Pertinent negatives include no chest pain, chills, ear congestion, ear pain, fever, headaches, heartburn, hemoptysis, myalgias, nasal congestion, postnasal drip, rhinorrhea, shortness of breath or wheezing. She has tried oral steroids ([facility]itin, mucinex, benzonatate) for the symptoms. The treatment provided mild relief. There is no history of asthma or environmental allergies.      Review of Systems  Constitutional: Positive for weight loss (coughs more after eating) and fatigue. Negative for fever, chills, activity change and appetite change.  HENT: Positive for sore throat (resolved). Negative for congestion, ear pain, postnasal drip and rhinorrhea.   Respiratory: Positive for cough. Negative for hemoptysis, chest tightness, shortness of breath and wheezing.   Cardiovascular: Negative for chest pain.  Gastrointestinal: Negative for heartburn, abdominal pain and diarrhea.  Genitourinary:       Having stress incontinence when coughs hard  Musculoskeletal: Negative for back pain and myalgias.  Allergic/Immunologic: Negative for environmental allergies.  Neurological: Negative for headaches.       Objective:   Physical Exam  Vitals reviewed. Constitutional: She appears well-developed and well-nourished. No distress.  HENT:  Head: Normocephalic and atraumatic.  Right Ear: External ear normal.  Left Ear: External ear normal.  Nose: Nose normal.  Mouth/Throat: Oropharynx is clear and moist. No oropharyngeal exudate.  Eyes: Conjunctivae are normal. Right eye exhibits no discharge. Left eye exhibits no discharge.  Neck:  Normal range of motion. Neck supple. No thyromegaly present.  Cardiovascular: Normal rate, regular rhythm and normal heart sounds.   No murmur heard. Pulmonary/Chest: Effort normal and breath sounds normal. No respiratory distress. She has no wheezes. She has no rales.  Lymphadenopathy:    She has no cervical adenopathy.  Skin: Skin is warm and dry.  Psychiatric: She has a normal mood and affect. Her behavior is normal. Thought content normal.          Assessment & Plan:  1. Cough 3 weeks, started w/sore throat DD: bronchitis, post viral pneumonitis - benzonatate (TESSALON) 100 MG capsule; Take 2 capsules (200 mg total) by mouth 3 (three) times daily. Take 2 tablets 3 times Daily  Dispense: 60 capsule; Refill: 0 Supportive care F/u if no improvement. 2 stress incontinence kegels 10 reps, 10 sets daily. Associate with activity that you do-like drinking water.

## 2013-11-08 ENCOUNTER — Encounter: Payer: Self-pay | Admitting: Nurse Practitioner

## 2013-11-08 DIAGNOSIS — Z0000 Encounter for general adult medical examination without abnormal findings: Secondary | ICD-10-CM | POA: Insufficient documentation

## 2013-11-08 DIAGNOSIS — Z8759 Personal history of other complications of pregnancy, childbirth and the puerperium: Secondary | ICD-10-CM | POA: Insufficient documentation

## 2013-11-10 ENCOUNTER — Encounter: Payer: Self-pay | Admitting: *Deleted

## 2013-11-10 LAB — CBC
HEMATOCRIT (KUC): 40.1 % (ref 34.8–46)
HEMOGLOBIN: 13.8 g/dL
MCH: 33.8
MCHC: 34.4
MCV: 98.5 fL (ref 78–100)
Platelet count: 222 10*3/uL (ref 140–400)
RBC: 4.07
RDW: 12.1
TSH: 0.8
WBC: 5.4

## 2013-12-07 ENCOUNTER — Encounter: Payer: Self-pay | Admitting: Nurse Practitioner

## 2013-12-07 ENCOUNTER — Ambulatory Visit (INDEPENDENT_AMBULATORY_CARE_PROVIDER_SITE_OTHER): Payer: BC Managed Care – PPO | Admitting: Nurse Practitioner

## 2013-12-07 VITALS — BP 149/88 | HR 73 | Temp 98.6°F | Ht 66.5 in | Wt 163.0 lb

## 2013-12-07 DIAGNOSIS — N3010 Interstitial cystitis (chronic) without hematuria: Secondary | ICD-10-CM | POA: Insufficient documentation

## 2013-12-07 DIAGNOSIS — R350 Frequency of micturition: Secondary | ICD-10-CM

## 2013-12-07 LAB — POCT URINALYSIS DIPSTICK
BILIRUBIN UA: NEGATIVE
Glucose, UA: NEGATIVE
Ketones, UA: NEGATIVE
LEUKOCYTES UA: NEGATIVE
NITRITE UA: NEGATIVE
PH UA: 5
PROTEIN UA: NEGATIVE
Spec Grav, UA: >=1.03
UROBILINOGEN UA: 0.2

## 2013-12-07 MED ORDER — PHENAZOPYRIDINE HCL 200 MG PO TABS: 200.0000 mg | ORAL_TABLET | Freq: Three times a day (TID) | ORAL | Status: DC | PRN

## 2013-12-07 MED ORDER — CIPROFLOXACIN HCL 250 MG PO TABS: 250.0000 mg | ORAL_TABLET | Freq: Two times a day (BID) | ORAL | Status: DC

## 2013-12-07 NOTE — Patient Instructions (Signed)
Start antibiotic. Our office will call if we need to change the antibiotic. Take pyridium to relax bladder, caution: urine tears & sweat will be orange. Do not be alarmed! Sip hydrating fluids (water, juice, colorless soda, decaff tea) every hour to flush kidneys. Return in 1 month to recheck urine or sooner if symptoms do not improve or you feel worse.  Urinary Tract Infection Urinary tract infections (UTIs) can develop anywhere along your urinary tract. Your urinary tract is your body's drainage system for removing wastes and extra water. Your urinary tract includes two kidneys, two ureters, a bladder, and a urethra. Your kidneys are a pair of bean-shaped organs. Each kidney is about the size of your fist. They are located below your ribs, one on each side of your spine. CAUSES Infections are caused by microbes, which are microscopic organisms, including fungi, viruses, and bacteria. These organisms are so small that they can only be seen through a microscope. Bacteria are the microbes that most commonly cause UTIs. SYMPTOMS  Symptoms of UTIs may vary by age and gender of the patient and by the location of the infection. Symptoms in young women typically include a frequent and intense urge to urinate and a painful, burning feeling in the bladder or urethra during urination. Older women and men are more likely to be tired, shaky, and weak and have muscle aches and abdominal pain. A fever may mean the infection is in your kidneys. Other symptoms of a kidney infection include pain in your back or sides below the ribs, nausea, and vomiting. DIAGNOSIS To diagnose a UTI, your caregiver will ask you about your symptoms. Your caregiver also will ask to provide a urine sample. The urine sample will be tested for bacteria and white blood cells. White blood cells are made by your body to help fight infection. TREATMENT  Typically, UTIs can be treated with medication. Because most UTIs are caused by a bacterial  infection, they usually can be treated with the use of antibiotics. The choice of antibiotic and length of treatment depend on your symptoms and the type of bacteria causing your infection. HOME CARE INSTRUCTIONS  If you were prescribed antibiotics, take them exactly as your caregiver instructs you. Finish the medication even if you feel better after you have only taken some of the medication.  Drink enough water and fluids to keep your urine clear or pale yellow.  Avoid caffeine, tea, and carbonated beverages. They tend to irritate your bladder.  Empty your bladder often. Avoid holding urine for long periods of time.  Empty your bladder before and after sexual intercourse.  After a bowel movement, women should cleanse from front to back. Use each tissue only once. SEEK MEDICAL CARE IF:   You have back pain.  You develop a fever.  Your symptoms do not begin to resolve within 3 days. SEEK IMMEDIATE MEDICAL CARE IF:   You have severe back pain or lower abdominal pain.  You develop chills.  You have nausea or vomiting.  You have continued burning or discomfort with urination. MAKE SURE YOU:   Understand these instructions.  Will watch your condition.  Will get help right away if you are not doing well or get worse. Document Released: 03/18/2005 Document Revised: 12/08/2011 Document Reviewed: 07/17/2011 [employer] Patient Information 2014 [employer].  

## 2013-12-07 NOTE — Progress Notes (Signed)
   Subjective:    Patient ID: Brenda Foster, female    DOB: 03-16-1966, 48 y.o.   MRN: [id number]  Urinary Frequency  This is a recurrent problem. The current episode started yesterday. The problem occurs every urination. The problem has been unchanged. The patient is experiencing no pain. There has been no fever. She is sexually active. There is no history of pyelonephritis. Associated symptoms include frequency, hesitancy and urgency. Pertinent negatives include no chills, discharge, flank pain, hematuria, nausea or vomiting. She has tried nothing for the symptoms. Her past medical history is significant for recurrent UTIs (last uti 3 mos ago.).      Review of Systems  Constitutional: Negative for fever, chills and fatigue.  Gastrointestinal: Negative for nausea and vomiting.  Genitourinary: Positive for hesitancy, urgency and frequency. Negative for dysuria, hematuria, flank pain, vaginal discharge and menstrual problem (on mc now).  Musculoskeletal: Negative for back pain.       Objective:   Physical Exam  Vitals reviewed. Constitutional: She is oriented to person, place, and time. She appears well-developed and well-nourished.  HENT:  Head: Normocephalic and atraumatic.  Eyes: Conjunctivae are normal. Right eye exhibits no discharge. Left eye exhibits no discharge.  Cardiovascular: Normal rate.   Pulmonary/Chest: Effort normal. No respiratory distress.  Abdominal: Soft. Bowel sounds are normal. She exhibits no distension. There is no tenderness. There is no rebound and no guarding.  Neurological: She is alert and oriented to person, place, and time.  Skin: Skin is warm and dry.  Psychiatric: She has a normal mood and affect. Her behavior is normal. Thought content normal.          Assessment & Plan:  1. Urinary frequency - POCT urinalysis dipstick-blood - Urine culture - ciprofloxacin (CIPRO) 250 MG tablet; Take 1 tablet (250 mg total) by mouth 2 (two) times daily.   Dispense: 6 tablet; Refill: 0 - phenazopyridine (PYRIDIUM) 200 MG tablet; Take 1 tablet (200 mg total) by mouth 3 (three) times daily as needed for pain.  Dispense: 6 tablet; Refill: 0

## 2013-12-07 NOTE — Progress Notes (Signed)
Pre visit review using our clinic review tool, if applicable. No additional management support is needed unless otherwise documented below in the visit note. 

## 2013-12-08 LAB — URINE CULTURE
Colony Count: NO GROWTH
Organism ID, Bacteria: NO GROWTH

## 2013-12-09 ENCOUNTER — Telehealth: Payer: Self-pay | Admitting: Nurse Practitioner

## 2013-12-09 DIAGNOSIS — R300 Dysuria: Secondary | ICD-10-CM

## 2013-12-09 DIAGNOSIS — R350 Frequency of micturition: Secondary | ICD-10-CM

## 2013-12-09 NOTE — Telephone Encounter (Signed)
pls call pt: Advise No growth in urine culture. She may continue with prescribed treatment-may have been urethritis rather than cystitis .

## 2013-12-11 NOTE — Telephone Encounter (Signed)
Spoke with pt, advised message. Pt has finished her ABX. She states she still has urinary frequency. Please advise on next step. Urology referral?

## 2013-12-13 MED ORDER — PHENAZOPYRIDINE HCL 200 MG PO TABS: 200.0000 mg | ORAL_TABLET | Freq: Three times a day (TID) | ORAL | Status: DC | PRN

## 2013-12-13 NOTE — Telephone Encounter (Signed)
Dr. Anitra Lauth can you address this for Layne please. Thanks

## 2013-12-13 NOTE — Telephone Encounter (Signed)
Spoke with pt, advised message from Dr Anitra Lauth. She would like to know if she can have a refill on Pyridium. Please advise.

## 2013-12-13 NOTE — Telephone Encounter (Signed)
Yes, please refill with 10 T, 0 refill. Thanks!

## 2013-12-13 NOTE — Telephone Encounter (Signed)
Patient is calling to check on her Urology Referral, also states she was told to continue the medication but she was only give 2 or 3 days of medication so she is not sure if she needs more or not

## 2013-12-13 NOTE — Telephone Encounter (Signed)
pls arrange urology referral to [facility] in [place] for further eval, dx is dysuria w/out urinary infection.-thx

## 2013-12-13 NOTE — Telephone Encounter (Signed)
Sent to pharmacy 

## 2014-01-08 ENCOUNTER — Encounter: Payer: Self-pay | Admitting: Nurse Practitioner

## 2014-02-15 ENCOUNTER — Encounter: Payer: Self-pay | Admitting: Nurse Practitioner

## 2014-02-15 ENCOUNTER — Ambulatory Visit (INDEPENDENT_AMBULATORY_CARE_PROVIDER_SITE_OTHER): Payer: BC Managed Care – PPO | Admitting: Nurse Practitioner

## 2014-02-15 VITALS — BP 122/72 | HR 65 | Temp 98.1°F | Resp 18 | Ht 66.5 in | Wt 165.0 lb

## 2014-02-15 DIAGNOSIS — M5430 Sciatica, unspecified side: Secondary | ICD-10-CM

## 2014-02-15 DIAGNOSIS — N3010 Interstitial cystitis (chronic) without hematuria: Secondary | ICD-10-CM

## 2014-02-15 DIAGNOSIS — M5441 Lumbago with sciatica, right side: Secondary | ICD-10-CM | POA: Insufficient documentation

## 2014-02-15 DIAGNOSIS — M545 Low back pain, unspecified: Secondary | ICD-10-CM

## 2014-02-15 LAB — POCT URINALYSIS DIPSTICK
Bilirubin, UA: NEGATIVE
Glucose, UA: NEGATIVE
KETONES UA: NEGATIVE
Leukocytes, UA: NEGATIVE
Nitrite, UA: NEGATIVE
PH UA: 5.5
PROTEIN UA: NEGATIVE
RBC UA: NEGATIVE
SPEC GRAV UA: 1.01
Urobilinogen, UA: 0.2

## 2014-02-15 MED ORDER — KETOROLAC TROMETHAMINE 60 MG/2ML IM SOLN
30.0000 mg | Freq: Once | INTRAMUSCULAR | Status: AC
  Administered 2014-02-15: 30 mg via INTRAMUSCULAR

## 2014-02-15 MED ORDER — KETOROLAC TROMETHAMINE 30 MG/ML IJ SOLN: 30.0000 mg | Freq: Once | INTRAMUSCULAR | Status: DC

## 2014-02-15 MED ORDER — KETOROLAC TROMETHAMINE 60 MG/2ML IM SOLN: 30.0000 mg | Freq: Once | INTRAMUSCULAR | Status: DC

## 2014-02-15 MED ORDER — PREDNISONE 10 MG PO TABS: ORAL_TABLET | ORAL | Status: DC

## 2014-02-15 MED ORDER — METHOCARBAMOL 500 MG PO TABS: 500.0000 mg | ORAL_TABLET | Freq: Every evening | ORAL | Status: DC | PRN

## 2014-02-15 NOTE — Assessment & Plan Note (Signed)
No recent flare. Pt reports for a few days every mo she has urgency. Trial: Cut out all artificial sweeteners for 8 weeks.

## 2014-02-15 NOTE — Assessment & Plan Note (Signed)
Musculoskeletal pain. Radiation to R thigh. toradol in ofc. Start prednisone tomorrow Daily back stretches. F/u 2 weeks, if no improvement will refer to back specialist or image & refer to PT.

## 2014-02-15 NOTE — Progress Notes (Signed)
Pre visit review using our clinic review tool, if applicable. No additional management support is needed unless otherwise documented below in the visit note. 

## 2014-02-15 NOTE — Progress Notes (Signed)
Subjective:    Tenley Winward is a 48 y.o. female who presents for evaluation of low back pain. Pain started 6 da while she was bent over feeding cat. Upon standing from forward flexed position she felt pain in low back that radiated to bilat buttock & r thigh. Pain has been persistent. Worse w/prolonged sitting & initially w/position changes, gets better w/walking. Taking 800 mg ibuprophen 3 to 4 times daily w/mild relief. She is concerned that back pain is related to occasional episodes of urinary frequency, although she is not currently having frequency . She denies fever, nausea, abd pain, paresthesia, bowel or bladder incontinence.  The following portions of the patient's history were reviewed and updated as appropriate: allergies, current medications, past medical history, past social history, past surgical history and problem list.  Review of Systems Pertinent items are noted in HPI.    Objective:   Inspection and palpation: L shoulder slightly higher than r, R scapula higher than l on forward flexion. Straight leg raise: positive at 45 degrees on the right. no tenderness over spine, mild tenderness over bilat SI joints    Assessment:  1. Midline low back pain with right-sided sciatica - POCT urinalysis dipstick -nml - predniSONE (DELTASONE) 10 MG tablet; Starting tomorrow morning, Take 4Tpo qam X 1d, then 3T po qam X 3d, then 2T po qd X 3d, then 1T po qam X 3d.  Dispense: 22 tablet; Refill: 0 - methocarbamol (ROBAXIN) 500 MG tablet; Take 1 tablet (500 mg total) by mouth at bedtime as needed for muscle spasms.  Dispense: 15 tablet; Refill: 0 - ketorolac (TORADOL) injection 30 mg; Inject 1 mL (30 mg total) into the muscle once. Decrease ibuprophen to 400 mg bid prn w/food.  F/u 2 wks  2. Interstitial cystitis Stop all artificial sweeteners for 8 weeks.

## 2014-02-15 NOTE — Patient Instructions (Signed)
Start prednisone tomorrow morning. Take daily for 10 days. Perform back stretches 3 times daily-take at least 10 minutes for good stretch. Avoid prolonged sitting. You may continue to use 400 mg ibuprophen twice daily with food. You can also add 1000mg  tylenol twice daily. Take muscle relaxer in evening if needed. Return in 2 weeks to re-evaluate.  Back Exercises These exercises may help you when beginning to rehabilitate your injury. Your symptoms may resolve with or without further involvement from your physician, physical therapist or athletic trainer. While completing these exercises, remember:   Restoring tissue flexibility helps normal motion to return to the joints. This allows healthier, less painful movement and activity.  An effective stretch should be held for at least 30 seconds.  A stretch should never be painful. You should only feel a gentle lengthening or release in the stretched tissue. RANGE OF MOTION- Quadruped, Neutral Spine   Assume a hands and knees position on a firm surface. Keep your hands under your shoulders and your knees under your hips. You may place padding under your knees for comfort.  Drop your head and point your tail bone toward the ground below you. This will round out your low back like an angry cat. Hold this position for __________ seconds.  Slowly lift your head and release your tail bone so that your back sags into a large arch, like an old horse.  Hold this position for __________ seconds.  Repeat this until you feel limber in your low back.  Now, find your "sweet spot." This will be the most comfortable position somewhere between the two previous positions. This is your neutral spine. Once you have found this position, tense your stomach muscles to support your low back.  Hold this position for __________ seconds. Repeat __________ times. Complete this exercise __________ times per day.  STRETCH - Flexion, Single Knee to Chest   Lie on a firm  bed or floor with both legs extended in front of you.  Keeping one leg in contact with the floor, bring your opposite knee to your chest. Hold your leg in place by either grabbing behind your thigh or at your knee.  Pull until you feel a gentle stretch in your low back. Hold __________ seconds.  Slowly release your grasp and repeat the exercise with the opposite side. Repeat __________ times. Complete this exercise __________ times per day.  STRETCH - Hamstrings, Standing  Stand or sit and extend your right / left leg, placing your foot on a chair or foot stool  Keeping a slight arch in your low back and your hips straight forward.  Lead with your chest and lean forward at the waist until you feel a gentle stretch in the back of your right / left knee or thigh. (When done correctly, this exercise requires leaning only a small distance.)  Hold this position for __________ seconds. Repeat __________ times. Complete this stretch __________ times per day. STRENGTHENING - Deep Abdominals, Pelvic Tilt   Lie on a firm bed or floor. Keeping your legs in front of you, bend your knees so they are both pointed toward the ceiling and your feet are flat on the floor.  Tense your lower abdominal muscles to press your low back into the floor. This motion will rotate your pelvis so that your tail bone is scooping upwards rather than pointing at your feet or into the floor.  With a gentle tension and even breathing, hold this position for __________ seconds. Repeat __________ times.  Complete this exercise __________ times per day.  Document Released: 06/26/2005 Document Revised: 08/31/2011 Document Reviewed: 09/20/2008 [facility] Patient Information 2015 [place], [place]. This information is not intended to replace advice given to you by your health care provider. Make sure you discuss any questions you have with your health care provider.

## 2014-02-28 ENCOUNTER — Ambulatory Visit (INDEPENDENT_AMBULATORY_CARE_PROVIDER_SITE_OTHER): Payer: BC Managed Care – PPO | Admitting: Nurse Practitioner

## 2014-02-28 ENCOUNTER — Other Ambulatory Visit: Payer: Self-pay | Admitting: Nurse Practitioner

## 2014-02-28 ENCOUNTER — Encounter: Payer: Self-pay | Admitting: Nurse Practitioner

## 2014-02-28 VITALS — BP 122/85 | HR 65 | Temp 97.8°F | Resp 18 | Ht 66.5 in | Wt 161.0 lb

## 2014-02-28 DIAGNOSIS — Z23 Encounter for immunization: Secondary | ICD-10-CM

## 2014-02-28 DIAGNOSIS — M5441 Lumbago with sciatica, right side: Secondary | ICD-10-CM

## 2014-02-28 DIAGNOSIS — M549 Dorsalgia, unspecified: Secondary | ICD-10-CM

## 2014-02-28 DIAGNOSIS — M5430 Sciatica, unspecified side: Secondary | ICD-10-CM

## 2014-02-28 MED ORDER — KETOROLAC TROMETHAMINE 60 MG/2ML IM SOLN
60.0000 mg | Freq: Once | INTRAMUSCULAR | Status: AC
  Administered 2014-02-28: 60 mg via INTRAMUSCULAR

## 2014-02-28 MED ORDER — PREDNISONE 10 MG PO TABS: ORAL_TABLET | ORAL | Status: DC

## 2014-02-28 MED ORDER — METHOCARBAMOL 500 MG PO TABS: 500.0000 mg | ORAL_TABLET | Freq: Every evening | ORAL | Status: DC | PRN

## 2014-02-28 NOTE — Patient Instructions (Signed)
Take medications as directed.   Continue with daily back stretches.   Heat may be helpful several times daily-use heating pad or sock w/rice.  See [facility].  Continue with good body mechanics. See tips below.  Have fun this weekend!  Back Injury Prevention Back injuries can be extremely painful and difficult to heal. After having one back injury, you are much more likely to experience another later on. It is important to learn how to avoid injuring or re-injuring your back. The following tips can help you to prevent a back injury. PHYSICAL FITNESS  Exercise regularly and try to develop good tone in your abdominal muscles. Your abdominal muscles provide a lot of the support needed by your back.  Do aerobic exercises (walking, jogging, biking, swimming) regularly.  Do exercises that increase balance and strength (tai chi, yoga) regularly. This can decrease your risk of falling and injuring your back.  Stretch before and after exercising.  Maintain a healthy weight. The more you weigh, the more stress is placed on your back. For every pound of weight, 10 times that amount of pressure is placed on the back. DIET  Talk to your caregiver about how much calcium and vitamin D you need per day. These nutrients help to prevent weakening of the bones (osteoporosis). Osteoporosis can cause broken (fractured) bones that lead to back pain.  Include good sources of calcium in your diet, such as dairy products, green, leafy vegetables, and products with calcium added (fortified).  Include good sources of vitamin D in your diet, such as milk and foods that are fortified with vitamin D.  Consider taking a nutritional supplement or a multivitamin if needed.  Stop smoking if you smoke. POSTURE  Sit and stand up straight. Avoid leaning forward when you sit or hunching over when you stand.  Choose chairs with good low back (lumbar) support.  If you work at a desk, sit close  to your work so you do not need to lean over. Keep your chin tucked in. Keep your neck drawn back and elbows bent at a right angle. Your arms should look like the letter "L."  Sit high and close to the steering wheel when you drive. Add a lumbar support to your car seat if needed.  Avoid sitting or standing in one position for too long. Take breaks to get up, stretch, and walk around at least once every hour. Take breaks if you are driving for long periods of time.  Sleep on your side with your knees slightly bent, or sleep on your back with a pillow under your knees. Do not sleep on your stomach. LIFTING, TWISTING, AND REACHING  Avoid heavy lifting, especially repetitive lifting. If you must do heavy lifting:  Stretch before lifting.  Work slowly.  Rest between lifts.  Use carts and dollies to move objects when possible.  Make several small trips instead of carrying 1 heavy load.  Ask for help when you need it.  Ask for help when moving big, awkward objects.  Follow these steps when lifting:  Stand with your feet shoulder-width apart.  Get as close to the object as you can. Do not try to pick up heavy objects that are far from your body.  Use handles or lifting straps if they are available.  Bend at your knees. Squat down, but keep your heels off the floor.  Keep your shoulders pulled back, your chin tucked in, and your back straight.  Lift the object slowly,  tightening the muscles in your legs, abdomen, and buttocks. Keep the object as close to the center of your body as possible.  When you put a load down, use these same guidelines in reverse.  Do not:  Lift the object above your waist.  Twist at the waist while lifting or carrying a load. Move your feet if you need to turn, not your waist.  Bend over without bending at your knees.  Avoid reaching over your head, across a table, or for an object on a high surface. OTHER TIPS  Avoid wet floors and keep sidewalks  clear of ice to prevent falls.  Do not sleep on a mattress that is too soft or too hard.  Keep items that are used frequently within easy reach.  Put heavier objects on shelves at waist level and lighter objects on lower or higher shelves.  Find ways to decrease your stress, such as exercise, massage, or relaxation techniques. Stress can build up in your muscles. Tense muscles are more vulnerable to injury.  Seek treatment for depression or anxiety if needed. These conditions can increase your risk of developing back pain. SEEK MEDICAL CARE IF:  You injure your back.  You have questions about diet, exercise, or other ways to prevent back injuries. MAKE SURE YOU:  Understand these instructions.  Will watch your condition.  Will get help right away if you are not doing well or get worse. Document Released: 07/16/2004 Document Revised: 08/31/2011 Document Reviewed: 07/20/2011 [facility] Patient Information 2015 [place], [place]. This information is not intended to replace advice given to you by your health care provider. Make sure you discuss any questions you have with your health care provider.

## 2014-02-28 NOTE — Progress Notes (Signed)
Pre visit review using our clinic review tool, if applicable. No additional management support is needed unless otherwise documented below in the visit note. 

## 2014-03-01 NOTE — Progress Notes (Signed)
Subjective:    Brenda Foster is a 48 y.o. female who presents for follow up of mid-low back problems. Current symptoms include: pain in mid low thoracic region (sharp in character; 5/10 in severity). Symptoms have got better w/toradol injection and prednisone from the previous visit. Exacerbating factors identified by the patient are bending forwards. Pain is returning since finished prednisone. Stretching daily.  The following portions of the patient's history were reviewed and updated as appropriate: allergies, current medications, past medical history, past social history, past surgical history and problem list.    Objective:    BP 122/85  Pulse 65  Temp(Src) 97.8 F (36.6 C) (Oral)  Resp 18  Ht 5' 6.5" (1.689 m)  Wt 161 lb (73.029 kg)  BMI 25.60 kg/m2  SpO2 100%  LMP 12/06/2013 General appearance: alert, cooperative, appears stated age and no distress Head: Normocephalic, without obvious abnormality, atraumatic Eyes: negative findings: lids and lashes normal and conjunctivae and sclerae normal Back: no tenderness over spine. Has pain in midline low thoracic area w/radition to R abdomen w/R sitting leg raise.    Assessment:  1. Midline low back pain with right-sided sciatica - predniSONE (DELTASONE) 10 MG tablet; Starting tomorrow morning, Take 3Tpo qam X 3d, then 2T po qam X 3d, then 1T po qd X 3d, then 1/2T po qam X 3d.  Dispense: 20 tablet; Refill: 0 - methocarbamol (ROBAXIN) 500 MG tablet; Take 1 tablet (500 mg total) by mouth at bedtime as needed for muscle spasms.  Dispense: 20 tablet; Refill: 0 - Ambulatory referral to Spine Surgery  2. Need for prophylactic vaccination and inoculation against influenza - Flu Vaccine QUAD 36+ mos PF IM (Fluarix Quad PF)

## 2014-03-02 ENCOUNTER — Ambulatory Visit: Payer: BC Managed Care – PPO | Admitting: Nurse Practitioner

## 2014-03-23 ENCOUNTER — Encounter: Payer: BC Managed Care – PPO | Admitting: Nurse Practitioner

## 2014-04-06 ENCOUNTER — Ambulatory Visit (INDEPENDENT_AMBULATORY_CARE_PROVIDER_SITE_OTHER): Payer: BC Managed Care – PPO | Admitting: Nurse Practitioner

## 2014-04-06 ENCOUNTER — Encounter: Payer: Self-pay | Admitting: Nurse Practitioner

## 2014-04-06 VITALS — BP 106/70 | HR 68 | Temp 98.4°F | Ht 66.5 in | Wt 163.0 lb

## 2014-04-06 DIAGNOSIS — Z23 Encounter for immunization: Secondary | ICD-10-CM

## 2014-04-06 DIAGNOSIS — Z0000 Encounter for general adult medical examination without abnormal findings: Secondary | ICD-10-CM

## 2014-04-06 DIAGNOSIS — Z1239 Encounter for other screening for malignant neoplasm of breast: Secondary | ICD-10-CM

## 2014-04-06 LAB — CBC WITH DIFFERENTIAL/PLATELET
BASOS ABS: 0 10*3/uL (ref 0.0–0.1)
Basophils Relative: 0.5 % (ref 0.0–3.0)
Eosinophils Absolute: 0.1 10*3/uL (ref 0.0–0.7)
Eosinophils Relative: 1.7 % (ref 0.0–5.0)
HEMATOCRIT: 41.7 % (ref 36.0–46.0)
HEMOGLOBIN: 13.8 g/dL (ref 12.0–15.0)
LYMPHS ABS: 1.9 10*3/uL (ref 0.7–4.0)
Lymphocytes Relative: 31.4 % (ref 12.0–46.0)
MCHC: 33.1 g/dL (ref 30.0–36.0)
MCV: 100.4 fl — AB (ref 78.0–100.0)
MONO ABS: 0.4 10*3/uL (ref 0.1–1.0)
Monocytes Relative: 7.3 % (ref 3.0–12.0)
NEUTROS ABS: 3.5 10*3/uL (ref 1.4–7.7)
Neutrophils Relative %: 59.1 % (ref 43.0–77.0)
PLATELETS: 196 10*3/uL (ref 150.0–400.0)
RBC: 4.16 Mil/uL (ref 3.87–5.11)
RDW: 13.1 % (ref 11.5–15.5)
WBC: 6 10*3/uL (ref 4.0–10.5)

## 2014-04-06 LAB — COMPREHENSIVE METABOLIC PANEL
ALK PHOS: 85 U/L (ref 39–117)
ALT: 14 U/L (ref 0–35)
AST: 19 U/L (ref 0–37)
Albumin: 3.7 g/dL (ref 3.5–5.2)
BILIRUBIN TOTAL: 0.6 mg/dL (ref 0.2–1.2)
BUN: 14 mg/dL (ref 6–23)
CO2: 28 mEq/L (ref 19–32)
CREATININE: 0.7 mg/dL (ref 0.4–1.2)
Calcium: 9.3 mg/dL (ref 8.4–10.5)
Chloride: 107 mEq/L (ref 96–112)
GFR: 88.56 mL/min (ref >60.00)
Glucose, Bld: 92 mg/dL (ref 70–99)
Potassium: 4.7 mEq/L (ref 3.5–5.1)
SODIUM: 139 meq/L (ref 135–145)
TOTAL PROTEIN: 7.2 g/dL (ref 6.0–8.3)

## 2014-04-06 LAB — LIPID PANEL
CHOLESTEROL: 190 mg/dL (ref 0–200)
HDL: 39.1 mg/dL (ref >39.00)
LDL CALC: 130 mg/dL — AB (ref 0–99)
NONHDL: 150.9
Total CHOL/HDL Ratio: 5
Triglycerides: 105 mg/dL (ref 0.0–149.0)
VLDL: 21 mg/dL (ref 0.0–40.0)

## 2014-04-06 LAB — URINALYSIS, MICROSCOPIC ONLY

## 2014-04-06 LAB — TSH: TSH: 0.86 u[IU]/mL (ref 0.35–4.50)

## 2014-04-06 LAB — VITAMIN D 25 HYDROXY (VIT D DEFICIENCY, FRACTURES): VITD: 32.01 ng/mL (ref 30.00–100.00)

## 2014-04-06 LAB — T4, FREE: FREE T4: 0.88 ng/dL (ref 0.60–1.60)

## 2014-04-06 LAB — HEMOGLOBIN A1C: Hgb A1c MFr Bld: 5.1 % (ref 4.6–6.5)

## 2014-04-06 NOTE — Progress Notes (Signed)
Pre visit review using our clinic review tool, if applicable. No additional management support is needed unless otherwise documented below in the visit note. 

## 2014-04-06 NOTE — Patient Instructions (Addendum)
Our office will call you with lab results and any necessary follow up.  Develop lifelong habits of exercise most days of the week: take a 30 minute walk. The benefits include weight loss, lower risk for heart disease, diabetes, stroke, high blood pressure, lower rates of depression & dementia, better sleep quality & bone health.  For best nutrition, read Eat to Live by Excell Seltzer and begin implementing principles. Eat mostly fruits & vegetable, nuts, seeds, & beans. Grains should have 4 grams or more fiber/serving. Meat & eggs should be limited to 3 times/week.   Calcium needs vary based meat consumption. If you are eating meat daily, you need 1200 mg calcium daily-600 mg of that should be through dark leafy foods & broccoli. If you are eating meat 3 times/week or less you only need about 600 mg daily. Get this through food.  Preventive Care for Adults, Female A healthy lifestyle and preventive care can promote health and wellness. Preventive health guidelines for women include the following key practices.  A routine yearly physical is a good way to check with your caregiver about your health and preventive screening. It is a chance to share any concerns and updates on your health, and to receive a thorough exam.  Visit your dentist for a routine exam and preventive care every 6 months. Brush your teeth twice a day and floss once a day. Good oral hygiene prevents tooth decay and gum disease.  The frequency of eye exams is based on your age, health, family medical history, use of contact lenses, and other factors. Follow your caregiver's recommendations for frequency of eye exams.  Eat a healthy diet. Foods like vegetables, fruits, whole grains, low-fat dairy products, and lean protein foods contain the nutrients you need without too many calories. Decrease your intake of foods high in solid fats, added sugars, and salt. Eat the right amount of calories for you.Get information about a proper diet  from your caregiver, if necessary.  Regular physical exercise is one of the most important things you can do for your health. Most adults should get at least 150 minutes of moderate-intensity exercise (any activity that increases your heart rate and causes you to sweat) each week. In addition, most adults need muscle-strengthening exercises on 2 or more days a week.  Maintain a healthy weight. The body mass index (BMI) is a screening tool to identify possible weight problems. It provides an estimate of body fat based on height and weight. Your caregiver can help determine your BMI, and can help you achieve or maintain a healthy weight.For adults 20 years and older:  A BMI below 18.5 is considered underweight.  A BMI of 18.5 to 24.9 is normal.  A BMI of 25 to 29.9 is considered overweight.  A BMI of 30 and above is considered obese.  Maintain normal blood lipids and cholesterol levels by exercising and minimizing your intake of saturated fat. Eat a balanced diet with plenty of fruit and vegetables. Blood tests for lipids and cholesterol should begin at age 39 and be repeated every 5 years. If your lipid or cholesterol levels are high, you are over 50, or you are at high risk for heart disease, you may need your cholesterol levels checked more frequently.Ongoing high lipid and cholesterol levels should be treated with medicines if diet and exercise are not effective.  If you smoke, find out from your caregiver how to quit. If you do not use tobacco, do not start.  Lung cancer screening  is recommended for adults aged 75 80 years who are at high risk for developing lung cancer because of a history of smoking. Yearly low-dose computed tomography (CT) is recommended for people who have at least a 30-pack-year history of smoking and are a current smoker or have quit within the past 15 years. A pack year of smoking is smoking an average of 1 pack of cigarettes a day for 1 year (for example: 1 pack a day  for 30 years or 2 packs a day for 15 years). Yearly screening should continue until the smoker has stopped smoking for at least 15 years. Yearly screening should also be stopped for people who develop a health problem that would prevent them from having lung cancer treatment.  If you are pregnant, do not drink alcohol. If you are breastfeeding, be very cautious about drinking alcohol. If you are not pregnant and choose to drink alcohol, do not exceed 1 drink per day. One drink is considered to be 12 ounces (355 mL) of beer, 5 ounces (148 mL) of wine, or 1.5 ounces (44 mL) of liquor.  Avoid use of street drugs. Do not share needles with anyone. Ask for help if you need support or instructions about stopping the use of drugs.  High blood pressure causes heart disease and increases the risk of stroke. Your blood pressure should be checked at least every 1 to 2 years. Ongoing high blood pressure should be treated with medicines if weight loss and exercise are not effective.  If you are 67 to 49 years old, ask your caregiver if you should take aspirin to prevent strokes.  Diabetes screening involves taking a blood sample to check your fasting blood sugar level. This should be done once every 3 years, after age 72, if you are within normal weight and without risk factors for diabetes. Testing should be considered at a younger age or be carried out more frequently if you are overweight and have at least 1 risk factor for diabetes.  Breast cancer screening is essential preventive care for women. You should practice "breast self-awareness." This means understanding the normal appearance and feel of your breasts and may include breast self-examination. Any changes detected, no matter how small, should be reported to a caregiver. Women in their 7s and 30s should have a clinical breast exam (CBE) by a caregiver as part of a regular health exam every 1 to 3 years. After age 2, women should have a CBE every year.  Starting at age 57, women should consider having a mammography (breast X-ray test) every year. Women who have a family history of breast cancer should talk to their caregiver about genetic screening. Women at a high risk of breast cancer should talk to their caregivers about having magnetic resonance imaging (MRI) and a mammography every year.  Breast cancer gene (BRCA)-related cancer risk assessment is recommended for women who have family members with BRCA-related cancers. BRCA-related cancers include breast, ovarian, tubal, and peritoneal cancers. Having family members with these cancers may be associated with an increased risk for harmful changes (mutations) in the breast cancer genes BRCA1 and BRCA2. Results of the assessment will determine the need for genetic counseling and BRCA1 and BRCA2 testing.  The Pap test is a screening test for cervical cancer. A Pap test can show cell changes on the cervix that might become cervical cancer if left untreated. A Pap test is a procedure in which cells are obtained and examined from the lower end of the  uterus (cervix).  Women should have a Pap test starting at age 79.  Between ages 53 and 52, Pap tests should be repeated every 2 years.  Beginning at age 43, you should have a Pap test every 3 years as long as the past 3 Pap tests have been normal.  Some women have medical problems that increase the chance of getting cervical cancer. Talk to your caregiver about these problems. It is especially important to talk to your caregiver if a new problem develops soon after your last Pap test. In these cases, your caregiver may recommend more frequent screening and Pap tests.  The above recommendations are the same for women who have or have not gotten the vaccine for human papillomavirus (HPV).  If you had a hysterectomy for a problem that was not cancer or a condition that could lead to cancer, then you no longer need Pap tests. Even if you no longer need a Pap  test, a regular exam is a good idea to make sure no other problems are starting.  If you are between ages 21 and 59, and you have had normal Pap tests going back 10 years, you no longer need Pap tests. Even if you no longer need a Pap test, a regular exam is a good idea to make sure no other problems are starting.  If you have had past treatment for cervical cancer or a condition that could lead to cancer, you need Pap tests and screening for cancer for at least 20 years after your treatment.  If Pap tests have been discontinued, risk factors (such as a new sexual partner) need to be reassessed to determine if screening should be resumed.  The HPV test is an additional test that may be used for cervical cancer screening. The HPV test looks for the virus that can cause the cell changes on the cervix. The cells collected during the Pap test can be tested for HPV. The HPV test could be used to screen women aged 45 years and older, and should be used in women of any age who have unclear Pap test results. After the age of 62, women should have HPV testing at the same frequency as a Pap test.  Colorectal cancer can be detected and often prevented. Most routine colorectal cancer screening begins at the age of 61 and continues through age 53. However, your caregiver may recommend screening at an earlier age if you have risk factors for colon cancer. On a yearly basis, your caregiver may provide home test kits to check for hidden blood in the stool. Use of a small camera at the end of a tube, to directly examine the colon (sigmoidoscopy or colonoscopy), can detect the earliest forms of colorectal cancer. Talk to your caregiver about this at age 32, when routine screening begins. Direct examination of the colon should be repeated every 5 to 10 years through age 26, unless early forms of pre-cancerous polyps or small growths are found.  Hepatitis C blood testing is recommended for all people born from [birth year] through  1965 and any individual with known risks for hepatitis C.  Practice safe sex. Use condoms and avoid high-risk sexual practices to reduce the spread of sexually transmitted infections (STIs). STIs include gonorrhea, chlamydia, syphilis, trichomonas, herpes, HPV, and human immunodeficiency virus (HIV). Herpes, HIV, and HPV are viral illnesses that have no cure. They can result in disability, cancer, and death. Sexually active women aged 47 and younger should be checked for chlamydia. Older  women with new or multiple partners should also be tested for chlamydia. Testing for other STIs is recommended if you are sexually active and at increased risk.  Osteoporosis is a disease in which the bones lose minerals and strength with aging. This can result in serious bone fractures. The risk of osteoporosis can be identified using a bone density scan. Women ages 18 and over and women at risk for fractures or osteoporosis should discuss screening with their caregivers. Ask your caregiver whether you should take a calcium supplement or vitamin D to reduce the rate of osteoporosis.  Menopause can be associated with physical symptoms and risks. Hormone replacement therapy is available to decrease symptoms and risks. You should talk to your caregiver about whether hormone replacement therapy is right for you.  Use sunscreen. Apply sunscreen liberally and repeatedly throughout the day. You should seek shade when your shadow is shorter than you. Protect yourself by wearing long sleeves, pants, a wide-brimmed hat, and sunglasses year round, whenever you are outdoors.  Once a month, do a whole body skin exam, using a mirror to look at the skin on your back. Notify your caregiver of new moles, moles that have irregular borders, moles that are larger than a pencil eraser, or moles that have changed in shape or color.  Stay current with required immunizations.  Influenza vaccine. All adults should be immunized every  year.  Tetanus, diphtheria, and acellular pertussis (Td, Tdap) vaccine. Pregnant women should receive 1 dose of Tdap vaccine during each pregnancy. The dose should be obtained regardless of the length of time since the last dose. Immunization is preferred during the 27th to 36th week of gestation. An adult who has not previously received Tdap or who does not know her vaccine status should receive 1 dose of Tdap. This initial dose should be followed by tetanus and diphtheria toxoids (Td) booster doses every 10 years. Adults with an unknown or incomplete history of completing a 3-dose immunization series with Td-containing vaccines should begin or complete a primary immunization series including a Tdap dose. Adults should receive a Td booster every 10 years.  Varicella vaccine. An adult without evidence of immunity to varicella should receive 2 doses or a second dose if she has previously received 1 dose. Pregnant females who do not have evidence of immunity should receive the first dose after pregnancy. This first dose should be obtained before leaving the health care facility. The second dose should be obtained 4 8 weeks after the first dose.  Human papillomavirus (HPV) vaccine. Females aged 36 26 years who have not received the vaccine previously should obtain the 3-dose series. The vaccine is not recommended for use in pregnant females. However, pregnancy testing is not needed before receiving a dose. If a female is found to be pregnant after receiving a dose, no treatment is needed. In that case, the remaining doses should be delayed until after the pregnancy. Immunization is recommended for any person with an immunocompromised condition through the age of 30 years if she did not get any or all doses earlier. During the 3-dose series, the second dose should be obtained 4 8 weeks after the first dose. The third dose should be obtained 24 weeks after the first dose and 16 weeks after the second  dose.  Zoster vaccine. One dose is recommended for adults aged 51 years or older unless certain conditions are present.  Measles, mumps, and rubella (MMR) vaccine. Adults born before [birth year] generally are considered immune to measles  and mumps. Adults born in [birth year] or later should have 1 or more doses of MMR vaccine unless there is a contraindication to the vaccine or there is laboratory evidence of immunity to each of the three diseases. A routine second dose of MMR vaccine should be obtained at least 28 days after the first dose for students attending postsecondary schools, health care workers, or international travelers. People who received inactivated measles vaccine or an unknown type of measles vaccine during 1963 1967 should receive 2 doses of MMR vaccine. People who received inactivated mumps vaccine or an unknown type of mumps vaccine before 1979 and are at high risk for mumps infection should consider immunization with 2 doses of MMR vaccine. For females of childbearing age, rubella immunity should be determined. If there is no evidence of immunity, females who are not pregnant should be vaccinated. If there is no evidence of immunity, females who are pregnant should delay immunization until after pregnancy. Unvaccinated health care workers born before [birth year] who lack laboratory evidence of measles, mumps, or rubella immunity or laboratory confirmation of disease should consider measles and mumps immunization with 2 doses of MMR vaccine or rubella immunization with 1 dose of MMR vaccine.  Pneumococcal 13-valent conjugate (PCV13) vaccine. When indicated, a person who is uncertain of her immunization history and has no record of immunization should receive the PCV13 vaccine. An adult aged 23 years or older who has certain medical conditions and has not been previously immunized should receive 1 dose of PCV13 vaccine. This PCV13 should be followed with a dose of pneumococcal polysaccharide (PPSV23) vaccine.  The PPSV23 vaccine dose should be obtained at least 8 weeks after the dose of PCV13 vaccine. An adult aged 20 years or older who has certain medical conditions and previously received 1 or more doses of PPSV23 vaccine should receive 1 dose of PCV13. The PCV13 vaccine dose should be obtained 1 or more years after the last PPSV23 vaccine dose.  Pneumococcal polysaccharide (PPSV23) vaccine. When PCV13 is also indicated, PCV13 should be obtained first. All adults aged 52 years and older should be immunized. An adult younger than age 18 years who has certain medical conditions should be immunized. Any person who resides in a nursing home or long-term care facility should be immunized. An adult smoker should be immunized. People with an immunocompromised condition and certain other conditions should receive both PCV13 and PPSV23 vaccines. People with human immunodeficiency virus (HIV) infection should be immunized as soon as possible after diagnosis. Immunization during chemotherapy or radiation therapy should be avoided. Routine use of PPSV23 vaccine is not recommended for American Indians, [place] Natives, or people younger than 65 years unless there are medical conditions that require PPSV23 vaccine. When indicated, people who have unknown immunization and have no record of immunization should receive PPSV23 vaccine. One-time revaccination 5 years after the first dose of PPSV23 is recommended for people aged 48 64 years who have chronic kidney failure, nephrotic syndrome, asplenia, or immunocompromised conditions. People who received 1 2 doses of PPSV23 before age 41 years should receive another dose of PPSV23 vaccine at age 75 years or later if at least 5 years have passed since the previous dose. Doses of PPSV23 are not needed for people immunized with PPSV23 at or after age 7 years.  Meningococcal vaccine. Adults with asplenia or persistent complement component deficiencies should receive 2 doses of quadrivalent  meningococcal conjugate (MenACWY-D) vaccine. The doses should be obtained at least 2 months apart. Microbiologists working with  certain meningococcal bacteria, [employer] recruits, people at risk during an outbreak, and people who travel to or live in countries with a high rate of meningitis should be immunized. A first-year college student up through age 66 years who is living in a residence hall should receive a dose if she did not receive a dose on or after her 16th birthday. Adults who have certain high-risk conditions should receive one or more doses of vaccine.  Hepatitis A vaccine. Adults who wish to be protected from this disease, have certain high-risk conditions, work with hepatitis A-infected animals, work in hepatitis A research labs, or travel to or work in countries with a high rate of hepatitis A should be immunized. Adults who were previously unvaccinated and who anticipate close contact with an international adoptee during the first 60 days after arrival in the [place] from a country with a high rate of hepatitis A should be immunized.  Hepatitis B vaccine. Adults who wish to be protected from this disease, have certain high-risk conditions, may be exposed to blood or other infectious body fluids, are household contacts or sex partners of hepatitis B positive people, are clients or workers in certain care facilities, or travel to or work in countries with a high rate of hepatitis B should be immunized.  Haemophilus influenzae type b (Hib) vaccine. A previously unvaccinated person with asplenia or sickle cell disease or having a scheduled splenectomy should receive 1 dose of Hib vaccine. Regardless of previous immunization, a recipient of a hematopoietic stem cell transplant should receive a 3-dose series 6 12 months after her successful transplant. Hib vaccine is not recommended for adults with HIV infection. Preventive Services / Frequency Ages 58 to 65  Blood pressure check.** /  Every 1 to 2 years.  Lipid and cholesterol check.** / Every 5 years beginning at age 75.  Clinical breast exam.** / Every 3 years for women in their 16s and 63s.  BRCA-related cancer risk assessment.** / For women who have family members with a BRCA-related cancer (breast, ovarian, tubal, or peritoneal cancers).  Pap test.** / Every 2 years from ages 33 through 21. Every 3 years starting at age 71 through age 61 or 30 with a history of 3 consecutive normal Pap tests.  HPV screening.** / Every 3 years from ages 42 through ages 70 to 55 with a history of 3 consecutive normal Pap tests.  Hepatitis C blood test.** / For any individual with known risks for hepatitis C.  Skin self-exam. / Monthly.  Influenza vaccine. / Every year.  Tetanus, diphtheria, and acellular pertussis (Tdap, Td) vaccine.** / Consult your caregiver. Pregnant women should receive 1 dose of Tdap vaccine during each pregnancy. 1 dose of Td every 10 years.  Varicella vaccine.** / Consult your caregiver. Pregnant females who do not have evidence of immunity should receive the first dose after pregnancy.  HPV vaccine. / 3 doses over 6 months, if 19 and younger. The vaccine is not recommended for use in pregnant females. However, pregnancy testing is not needed before receiving a dose.  Measles, mumps, rubella (MMR) vaccine.** / You need at least 1 dose of MMR if you were born in 1957 or later. You may also need a 2nd dose. For females of childbearing age, rubella immunity should be determined. If there is no evidence of immunity, females who are not pregnant should be vaccinated. If there is no evidence of immunity, females who are pregnant should delay immunization until after pregnancy.  Pneumococcal 13-valent  conjugate (PCV13) vaccine.** / Consult your caregiver.  Pneumococcal polysaccharide (PPSV23) vaccine.** / 1 to 2 doses if you smoke cigarettes or if you have certain conditions.  Meningococcal vaccine.** / 1 dose if  you are age 61 to 34 years and a Market researcher living in a residence hall, or have one of several medical conditions, you need to get vaccinated against meningococcal disease. You may also need additional booster doses.  Hepatitis A vaccine.** / Consult your caregiver.  Hepatitis B vaccine.** / Consult your caregiver.  Haemophilus influenzae type b (Hib) vaccine.** / Consult your caregiver. Ages 46 to 43  Blood pressure check.** / Every 1 to 2 years.  Lipid and cholesterol check.** / Every 5 years beginning at age 47.  Lung cancer screening. / Every year if you are aged 16 80 years and have a 30-pack-year history of smoking and currently smoke or have quit within the past 15 years. Yearly screening is stopped once you have quit smoking for at least 15 years or develop a health problem that would prevent you from having lung cancer treatment.  Clinical breast exam.** / Every year after age 53.  BRCA-related cancer risk assessment.** / For women who have family members with a BRCA-related cancer (breast, ovarian, tubal, or peritoneal cancers).  Mammogram.** / Every year beginning at age 64 and continuing for as long as you are in good health. Consult with your caregiver.  Pap test.** / Every 3 years starting at age 52 through age 14 or 29 with a history of 3 consecutive normal Pap tests.  HPV screening.** / Every 3 years from ages 70 through ages 15 to 33 with a history of 3 consecutive normal Pap tests.  Fecal occult blood test (FOBT) of stool. / Every year beginning at age 44 and continuing until age 98. You may not need to do this test if you get a colonoscopy every 10 years.  Flexible sigmoidoscopy or colonoscopy.** / Every 5 years for a flexible sigmoidoscopy or every 10 years for a colonoscopy beginning at age 72 and continuing until age 35.  Hepatitis C blood test.** / For all people born from [birth year] through 1966 and any individual with known risks for hepatitis  C.  Skin self-exam. / Monthly.  Influenza vaccine. / Every year.  Tetanus, diphtheria, and acellular pertussis (Tdap/Td) vaccine.** / Consult your caregiver. Pregnant women should receive 1 dose of Tdap vaccine during each pregnancy. 1 dose of Td every 10 years.  Varicella vaccine.** / Consult your caregiver. Pregnant females who do not have evidence of immunity should receive the first dose after pregnancy.  Zoster vaccine.** / 1 dose for adults aged 80 years or older.  Measles, mumps, rubella (MMR) vaccine.** / You need at least 1 dose of MMR if you were born in 1957 or later. You may also need a 2nd dose. For females of childbearing age, rubella immunity should be determined. If there is no evidence of immunity, females who are not pregnant should be vaccinated. If there is no evidence of immunity, females who are pregnant should delay immunization until after pregnancy.  Pneumococcal 13-valent conjugate (PCV13) vaccine.** / Consult your caregiver.  Pneumococcal polysaccharide (PPSV23) vaccine.** / 1 to 2 doses if you smoke cigarettes or if you have certain conditions.  Meningococcal vaccine.** / Consult your caregiver.  Hepatitis A vaccine.** / Consult your caregiver.  Hepatitis B vaccine.** / Consult your caregiver.  Haemophilus influenzae type b (Hib) vaccine.** / Consult your caregiver. Ages 55 and over  Blood pressure check.** / Every 1 to 2 years.  Lipid and cholesterol check.** / Every 5 years beginning at age 75.  Lung cancer screening. / Every year if you are aged 34 80 years and have a 30-pack-year history of smoking and currently smoke or have quit within the past 15 years. Yearly screening is stopped once you have quit smoking for at least 15 years or develop a health problem that would prevent you from having lung cancer treatment.  Clinical breast exam.** / Every year after age 37.  BRCA-related cancer risk assessment.** / For women who have family members with a  BRCA-related cancer (breast, ovarian, tubal, or peritoneal cancers).  Mammogram.** / Every year beginning at age 21 and continuing for as long as you are in good health. Consult with your caregiver.  Pap test.** / Every 3 years starting at age 53 through age 77 or 57 with a 3 consecutive normal Pap tests. Testing can be stopped between 65 and 70 with 3 consecutive normal Pap tests and no abnormal Pap or HPV tests in the past 10 years.  HPV screening.** / Every 3 years from ages 92 through ages 19 or 56 with a history of 3 consecutive normal Pap tests. Testing can be stopped between 65 and 70 with 3 consecutive normal Pap tests and no abnormal Pap or HPV tests in the past 10 years.  Fecal occult blood test (FOBT) of stool. / Every year beginning at age 31 and continuing until age 71. You may not need to do this test if you get a colonoscopy every 10 years.  Flexible sigmoidoscopy or colonoscopy.** / Every 5 years for a flexible sigmoidoscopy or every 10 years for a colonoscopy beginning at age 58 and continuing until age 55.  Hepatitis C blood test.** / For all people born from [birth year] through 1966 and any individual with known risks for hepatitis C.  Osteoporosis screening.** / A one-time screening for women ages 59 and over and women at risk for fractures or osteoporosis.  Skin self-exam. / Monthly.  Influenza vaccine. / Every year.  Tetanus, diphtheria, and acellular pertussis (Tdap/Td) vaccine.** / 1 dose of Td every 10 years.  Varicella vaccine.** / Consult your caregiver.  Zoster vaccine.** / 1 dose for adults aged 64 years or older.  Pneumococcal 13-valent conjugate (PCV13) vaccine.** / Consult your caregiver.  Pneumococcal polysaccharide (PPSV23) vaccine.** / 1 dose for all adults aged 17 years and older.  Meningococcal vaccine.** / Consult your caregiver.  Hepatitis A vaccine.** / Consult your caregiver.  Hepatitis B vaccine.** / Consult your caregiver.  Haemophilus  influenzae type b (Hib) vaccine.** / Consult your caregiver. ** Family history and personal history of risk and conditions may change your caregiver's recommendations. Document Released: 08/04/2001 Document Revised: 10/03/2012 Document Reviewed: 11/03/2010 [facility] Patient Information 2014 [place], [place].

## 2014-04-07 LAB — HIV ANTIBODY (ROUTINE TESTING W REFLEX): HIV: NONREACTIVE

## 2014-04-10 ENCOUNTER — Telehealth: Payer: Self-pay | Admitting: Nurse Practitioner

## 2014-04-10 NOTE — Telephone Encounter (Signed)
pls call pt: Advise All labs look great! Vit D little low. Start D3 2000 iu daily with meal. Make lab appt. In 3 mos to check level.

## 2014-04-11 NOTE — Telephone Encounter (Signed)
LMOVM for pt to return call concerning results.

## 2014-04-11 NOTE — Progress Notes (Signed)
Subjective:     Brenda Foster is a 48 y.o. female and is here for a comprehensive physical exam. The patient reports no problems. She sees gynecology regularly for routine womancare.  She was seen in office several weeks ago for back pain. I referred her to [facility]. She is improving with exercises.   History   Social History  . Marital Status: Married    Spouse Name: N/A    Number of Children: 4  . Years of Education: N/A   Occupational History  . school psychologist [place] History Main Topics  . Smoking status: Never Smoker   . Smokeless tobacco: Never Used  . Alcohol Use: Yes  . Drug Use: No  . Sexual Activity: Yes    Birth Control/ Protection: Surgical   Other Topics Concern  . Not on file   Social History Narrative   Lives with husband and children. 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   Health Maintenance  Topic Date Due  . Pap Smear  06/22/2014  . Influenza Vaccine  01/21/2015  . Tetanus/tdap  04/06/2024    The following portions of the patient's history were reviewed and updated as appropriate: allergies, current medications, past family history, past medical history, past social history, past surgical history and problem list.  Review of Systems Pertinent items are noted in HPI.   Objective:    BP 106/70  Pulse 68  Temp(Src) 98.4 F (36.9 C) (Temporal)  Ht 5' 6.5" (1.689 m)  Wt 163 lb (73.936 kg)  BMI 25.92 kg/m2  SpO2 100%  LMP 02/20/2014 General appearance: alert, cooperative, appears stated age and no distress Head: Normocephalic, without obvious abnormality, atraumatic Eyes: negative findings: lids and lashes normal, conjunctivae and sclerae normal, corneas clear and pupils equal, round, reactive to light and accomodation Ears: normal TM's and external ear canals both ears Throat: lips, mucosa, and tongue normal; teeth and gums normal Neck: no adenopathy, no carotid bruit, supple, symmetrical, trachea midline  and thyroid not enlarged, symmetric, no tenderness/mass/nodules Back: range of motion normal, mild dextroscoliosis Lungs: clear to auscultation bilaterally Heart: regular rate and rhythm, S1, S2 normal, no murmur, click, rub or gallop Abdomen: soft, non-tender; bowel sounds normal; no masses,  no organomegaly Extremities: extremities normal, atraumatic, no cyanosis or edema Pulses: 2+ and symmetric Skin: Skin color, texture, turgor normal. No rashes or lesions Lymph nodes: Cervical, supraclavicular, and axillary nodes normal. Neurologic: Grossly normal    Assessment:  1. Preventative health care - CBC with Differential - Comprehensive metabolic panel - Lipid panel - HIV antibody - Hemoglobin A1c - TSH - T4, free - Vit D  25 hydroxy (rtn osteoporosis monitoring) - Urine Microscopic  2. Breast cancer screening  3. Need for Tdap vaccination - Tdap vaccine greater than or equal to 7yo IM  See problem list for complete A&P See pt instructions. F/u 1 to 2 yrs.

## 2014-04-13 NOTE — Telephone Encounter (Signed)
Patient notified

## 2014-10-08 ENCOUNTER — Ambulatory Visit (INDEPENDENT_AMBULATORY_CARE_PROVIDER_SITE_OTHER): Payer: BC Managed Care – PPO | Admitting: Nurse Practitioner

## 2014-10-08 ENCOUNTER — Encounter: Payer: Self-pay | Admitting: Nurse Practitioner

## 2014-10-08 VITALS — BP 126/79 | HR 80 | Temp 97.9°F | Ht 66.5 in | Wt 159.0 lb

## 2014-10-08 DIAGNOSIS — F418 Other specified anxiety disorders: Secondary | ICD-10-CM

## 2014-10-08 MED ORDER — SERTRALINE HCL 25 MG PO TABS: ORAL_TABLET | ORAL | Status: DC

## 2014-10-08 MED ORDER — LORAZEPAM 0.5 MG PO TABS: 0.5000 mg | ORAL_TABLET | Freq: Three times a day (TID) | ORAL | Status: DC | PRN

## 2014-10-08 MED ORDER — FLUOXETINE HCL 10 MG PO CAPS: ORAL_CAPSULE | ORAL | Status: DC

## 2014-10-08 NOTE — Patient Instructions (Signed)
Start zoloft. Increase dose after 1 week as directed. Use ativan as needed, as directed.  See me in 3 weeks to re-evaluate.

## 2014-10-08 NOTE — Progress Notes (Signed)
Pre visit review using our clinic review tool, if applicable. No additional management support is needed unless otherwise documented below in the visit note. 

## 2014-10-08 NOTE — Progress Notes (Signed)
Subjective:     Brenda Foster is a 49 y.o. female who presents for new evaluation and treatment of anxiety disorder. She has the following anxiety symptoms: ruminating about current stressful situation, feels shaky, stomach feels like "it has butterflies all the time". Onset of symptoms was approximately 3 weeks ago. Symptoms have been gradually worsening since that time. She denies current suicidal and homicidal ideation. Risk factors: previous episode of depression. Previous treatment includes Prozac. She did not have intolerable side effects. I would like to use that will have faster onset of action. She used 1/2 T 0.5mg  ativan with good results-felt more relaxed. Took 1 T before bedtime & felt "groggy" next day.  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  The following portions of the patient's history were reviewed and updated as appropriate: allergies, current medications, past medical history, past social history, past surgical history and problem list.  Review of Systems Pertinent items are noted in HPI.    Objective:    BP 126/79 mmHg  Pulse 80  Temp(Src) 97.9 F (36.6 C) (Oral)  Ht 5' 6.5" (1.689 m)  Wt 159 lb (72.122 kg)  BMI 25.28 kg/m2  SpO2 99%  LMP 09/27/2014 General appearance: alert, cooperative, appears stated age and mild distress Head: Normocephalic, without obvious abnormality, atraumatic Eyes: negative findings: lids and lashes normal and conjunctivae and sclerae normal Lungs: clear to auscultation bilaterally Heart: regular rate and rhythm, S1, S2 normal, no murmur, click, rub or gallop Neurologic: Grossly normal    Assessment:Plan  1. Situational anxiety start - LORazepam (ATIVAN) 0.5 MG tablet; Take 1 tablet (0.5 mg total) by mouth 3 (three) times daily as needed for anxiety.  Dispense: 30 tablet; Refill: 0 - sertraline (ZOLOFT) 25 MG tablet; Tale 1T PO QD X 7d, then increase to 2T PO  Dispense: 40 tablet; Refill: 0  F/u 3 weeks  or sooner if needed.

## 2014-10-26 ENCOUNTER — Ambulatory Visit (INDEPENDENT_AMBULATORY_CARE_PROVIDER_SITE_OTHER): Payer: BC Managed Care – PPO | Admitting: Nurse Practitioner

## 2014-10-26 ENCOUNTER — Encounter: Payer: Self-pay | Admitting: Nurse Practitioner

## 2014-10-26 VITALS — BP 125/84 | HR 69 | Temp 98.5°F | Ht 66.5 in | Wt 157.0 lb

## 2014-10-26 DIAGNOSIS — Z0000 Encounter for general adult medical examination without abnormal findings: Secondary | ICD-10-CM | POA: Diagnosis not present

## 2014-10-26 DIAGNOSIS — Z7689 Persons encountering health services in other specified circumstances: Secondary | ICD-10-CM

## 2014-10-26 DIAGNOSIS — F418 Other specified anxiety disorders: Secondary | ICD-10-CM | POA: Diagnosis not present

## 2014-10-26 LAB — COMPREHENSIVE METABOLIC PANEL
ALT: 12 U/L (ref 0–35)
AST: 17 U/L (ref 0–37)
Albumin: 3.8 g/dL (ref 3.5–5.2)
Alkaline Phosphatase: 76 U/L (ref 39–117)
BILIRUBIN TOTAL: 0.4 mg/dL (ref 0.2–1.2)
BUN: 10 mg/dL (ref 6–23)
CHLORIDE: 108 meq/L (ref 96–112)
CO2: 27 mEq/L (ref 19–32)
CREATININE: 0.78 mg/dL (ref 0.40–1.20)
Calcium: 8.7 mg/dL (ref 8.4–10.5)
GFR: 83.15 mL/min (ref >60.00)
Glucose, Bld: 111 mg/dL — ABNORMAL HIGH (ref 70–99)
Potassium: 4 mEq/L (ref 3.5–5.1)
Sodium: 140 mEq/L (ref 135–145)
Total Protein: 6.4 g/dL (ref 6.0–8.3)

## 2014-10-26 MED ORDER — SERTRALINE HCL 50 MG PO TABS: 50.0000 mg | ORAL_TABLET | Freq: Every day | ORAL | Status: DC

## 2014-10-26 MED ORDER — LORAZEPAM 0.5 MG PO TABS: 0.5000 mg | ORAL_TABLET | Freq: Two times a day (BID) | ORAL | Status: DC | PRN

## 2014-10-26 NOTE — Progress Notes (Signed)
Pre visit review using our clinic review tool, if applicable. No additional management support is needed unless otherwise documented below in the visit note. 

## 2014-10-26 NOTE — Progress Notes (Signed)
Subjective:     Brenda Foster is a 49 y.o. female who presents for follow up of anxiety disorder-ruminations regarding stressful situation with neighbor. She was seen in office 3 weeks ago with significant stress secondary to ruminating thoughts that were interfering with work & sleep. She was started on zoloft 25 mg w/titration to 50 mg qd. She has less ruminating thoughts, sleeping well, although thinks about stressful event if wakes early in morning. She has developed fine motor tremor since starting med. She recalls having this when she took prozac. The tremor is not bothersome. She wishes to continue zoloft at present. She is taking 1/2 tab 0.5 mg ativan about twice daily with calming results. I discussed addictive potential of benzos. She is aware this medication is intended for st use.  The following portions of the patient's history were reviewed and updated as appropriate: allergies, current medications, past medical history, past social history, past surgical history and problem list.    Objective:    BP 125/84 mmHg  Pulse 69  Temp(Src) 98.5 F (36.9 C) (Oral)  Ht 5' 6.5" (1.689 m)  Wt 157 lb (71.215 kg)  BMI 24.96 kg/m2  SpO2 99%  LMP 10/23/2014  General:  alert, cooperative, appears stated age and no distress  Affect/Behavior:   Lungs: Heart: Neuro:  full facial expressions, good grooming, good insight, normal perception, normal reasoning, normal speech pattern and content and normal thought patterns  Clear bilat RRR, no murmur No fine tremor noticeable on exam, nml gait      Assessment:Plan   1. Encounter for new medication prescription Check liver enzymes since starting zoloft - Comprehensive metabolic panel  2. Situational anxiety continue - LORazepam (ATIVAN) 0.5 MG tablet; Take 1 tablet (0.5 mg total) by mouth 2 (two) times daily as needed for anxiety.  Dispense: 45 tablet; Refill: 2 - sertraline (ZOLOFT) 50 MG tablet; Take 1 tablet (50 mg total) by mouth at  bedtime.  Dispense: 30 tablet; Refill: 2 F/u 10 weeks

## 2014-10-26 NOTE — Patient Instructions (Signed)
Continue zoloft. Take at night.  Take ativan twice daily if needed. Please return in 12 weeks or sooner if you have concerns.  Nice to see you!

## 2014-10-29 ENCOUNTER — Other Ambulatory Visit: Payer: Self-pay

## 2014-10-29 NOTE — Telephone Encounter (Signed)
Patient called back stating that she wants to come off of the Zoloft, how does she need to do this? Also wanted me to tell you that she never picked up the prescription for the 50mg  Zoloft.

## 2014-10-29 NOTE — Telephone Encounter (Signed)
Please Advise? Patient called stating that when she was in on Friday 10/26/14, she had told you about Hand tremors she was having and that she could deal with it. She has decided that she does not want to deal with the tremors and wants to come off of Zoloft and be put onto something else that wont cause the tremors.

## 2014-10-29 NOTE — Telephone Encounter (Signed)
LMOVM with information. Also going to send a [employer].

## 2014-10-29 NOTE — Telephone Encounter (Signed)
Skip every 3rd day for 1 week, then skip every other day for 1 week, then take every 3rd day for 1 week then stop.

## 2014-10-29 NOTE — Telephone Encounter (Signed)
pls call pt: Advise She may have misunderstood, there are no other meds in the same class that do not have tremor as reported SE. She can try a tricyclic antidepressant, like clomipramine. It has other potential SE. She should read about medication on webmd or come in for appt. To discuss & let me know if she wants to switch.

## 2014-10-29 NOTE — Telephone Encounter (Addendum)
Called and informed patient. She only has enough pills to get her through today. I have pended orders for you too order.  Layne actually decided to have her decrease to 25mg  every day for 1 week, then take it every other day for 1 week, then take every 3rd day for 1 week.

## 2014-10-29 NOTE — Addendum Note (Signed)
Addended by: Audley Hose on: 10/29/2014 03:47 PM   Modules accepted: Orders

## 2014-10-30 ENCOUNTER — Other Ambulatory Visit: Payer: Self-pay | Admitting: Nurse Practitioner

## 2014-10-30 MED ORDER — SERTRALINE HCL 25 MG PO TABS: ORAL_TABLET | ORAL | Status: DC

## 2014-12-25 ENCOUNTER — Other Ambulatory Visit: Payer: Self-pay | Admitting: Nurse Practitioner

## 2014-12-25 ENCOUNTER — Other Ambulatory Visit: Payer: Self-pay | Admitting: Family Medicine

## 2014-12-25 NOTE — Telephone Encounter (Signed)
Patient called back stating she is no longer taking that medication.  She only needed it for "short term"  She is doing great and does not want rf.

## 2014-12-25 NOTE — Telephone Encounter (Signed)
Left detailed message on pt's cell.  OKay per DPR.

## 2014-12-25 NOTE — Telephone Encounter (Signed)
She should have 1 more refill. She will need OV when that refill is finished (end of July/early August) to evaluate efficacy of med.

## 2015-07-31 ENCOUNTER — Ambulatory Visit (INDEPENDENT_AMBULATORY_CARE_PROVIDER_SITE_OTHER): Payer: BC Managed Care – PPO | Admitting: Family Medicine

## 2015-07-31 ENCOUNTER — Encounter: Payer: Self-pay | Admitting: Family Medicine

## 2015-07-31 VITALS — BP 117/80 | HR 59 | Temp 98.5°F | Resp 20 | Wt 147.2 lb

## 2015-07-31 DIAGNOSIS — J0100 Acute maxillary sinusitis, unspecified: Secondary | ICD-10-CM

## 2015-07-31 DIAGNOSIS — R5383 Other fatigue: Secondary | ICD-10-CM

## 2015-07-31 LAB — CBC WITH DIFFERENTIAL/PLATELET
BASOS ABS: 0 10*3/uL (ref 0.0–0.1)
Basophils Relative: 0 % (ref 0–1)
EOS ABS: 0.1 10*3/uL (ref 0.0–0.7)
EOS PCT: 2 % (ref 0–5)
HCT: 39.9 % (ref 36.0–46.0)
Hemoglobin: 13.8 g/dL (ref 12.0–15.0)
LYMPHS ABS: 2.6 10*3/uL (ref 0.7–4.0)
Lymphocytes Relative: 36 % (ref 12–46)
MCH: 34.4 pg — ABNORMAL HIGH (ref 26.0–34.0)
MCHC: 34.6 g/dL (ref 30.0–36.0)
MCV: 99.5 fL (ref 78.0–100.0)
MPV: 10.9 fL (ref 8.6–12.4)
Monocytes Absolute: 0.5 10*3/uL (ref 0.1–1.0)
Monocytes Relative: 7 % (ref 3–12)
Neutro Abs: 4 10*3/uL (ref 1.7–7.7)
Neutrophils Relative %: 55 % (ref 43–77)
PLATELETS: 201 10*3/uL (ref 150–400)
RBC: 4.01 MIL/uL (ref 3.87–5.11)
RDW: 13 % (ref 11.5–15.5)
WBC: 7.3 10*3/uL (ref 4.0–10.5)

## 2015-07-31 LAB — BASIC METABOLIC PANEL
BUN: 14 mg/dL (ref 7–25)
CHLORIDE: 104 mmol/L (ref 98–110)
CO2: 25 mmol/L (ref 20–31)
Calcium: 9.2 mg/dL (ref 8.6–10.4)
Creat: 0.83 mg/dL (ref 0.50–1.05)
Glucose, Bld: 87 mg/dL (ref 65–99)
POTASSIUM: 3.8 mmol/L (ref 3.5–5.3)
SODIUM: 138 mmol/L (ref 135–146)

## 2015-07-31 LAB — TSH: TSH: 0.86 mIU/L

## 2015-07-31 LAB — VITAMIN B12: VITAMIN B 12: 855 pg/mL (ref 200–1100)

## 2015-07-31 MED ORDER — AMOXICILLIN-POT CLAVULANATE 875-125 MG PO TABS: 1.0000 | ORAL_TABLET | Freq: Two times a day (BID) | ORAL | Status: DC

## 2015-07-31 NOTE — Progress Notes (Signed)
Patient ID: Brenda Foster, female   DOB: 06/04/1966, 50 y.o.   MRN: [id number]    Brenda Foster , 01-Jul-1965, 50 y.o., female MRN: [id number]  CC: Fatigue Subjective: Pt presents for an acute OV with complaints of exhaustion of 2 weeks duration. She reports feeling "foggy headed". She states that she has a history of depression and anxiety and has been seeing a provider for this treatment. She states that she has been described Prozac, Wellbutrin, Inderal and amitriptyline. She states originally she was on Prozac and Ativan, when she went to see this new provider he discontinued the Ativan. She developed a tremor on the Prozac so he added Inderal. She is having sexual side effects secondary to the Prozac, so he started Wellbutrin. Patient states she feels like she is overmedicated, she is falling asleep easily and early. She states she fell asleep at the dentist office while waiting. Because of the increased fatigue over the last few weeks she had decided to discontinue the Wellbutrin that was started 4 weeks ago, and she's been tapering herself off. Trial of not taking the amitriptyline for a few days, and that did not seem to result with any improvement in her energy. So she restarted it, as it is helping with her insomnia. She has continued the Inderal, and her heart rate is 59 today, this has been consistent with her prior readings. This was reviewed with her. In addition she states she has had recent head cold, that started last week. She reports as congestion, nasal congestion, rhinorrhea, fatigue. She denies fevers, chills, nausea, vomit, diarrhea or rash. Does admit to increase in throat clearing and postnasal drip, but no cough. She feels her stressful situation reasoning for increase in her medication has subsided, and she would like to think about coming off some of these medications. She feels she is doing better and the standpoint of her mental health.  Dr. Sharalyn Ink D.O. is managing her  depression and anxiety.   Allergies  Allergen Reactions  . Sulfa Antibiotics Hives   Social History  Substance Use Topics  . Smoking status: Never Smoker   . Smokeless tobacco: Never Used  . Alcohol Use: Yes   Past Medical History  Diagnosis Date  . Depression     Mild n/ infertility  . Migraines   . Urine incontinence     Mild  . Urinary tract bacterial infections     2x  . Infertility   . History of miscarriage    Past Surgical History  Procedure Laterality Date  . Appendectomy  1980  . Breast surgery  1986    Reduction  . Cesarean section  2003  . Tubal ligation  2003   Family History  Problem Relation Age of Onset  . Cancer Father     prostate  . Heart disease Father 57    MI, premature  . Stroke Father     Mind  . Stroke Maternal Grandfather   . Anemia Mother   . Autism spectrum disorder Daughter   . Stroke Maternal Grandmother      Medication List       This list is accurate as of: 07/31/15  3:23 PM.  Always use your most recent med list.               buPROPion 150 MG 24 hr tablet  Commonly known as:  WELLBUTRIN XL  Take 150 mg by mouth every morning.     FLUoxetine 20 MG capsule  Commonly  known as:  PROZAC  Take 40 mg by mouth daily.     multivitamin tablet  Take 1 tablet by mouth daily.     propranolol ER 60 MG 24 hr capsule  Commonly known as:  INDERAL LA  Take 60 mg by mouth daily.       ROS: Negative, with the exception of above mentioned in HPI  Objective:  BP 117/80 mmHg  Pulse 59  Temp(Src) 98.5 F (36.9 C)  Resp 20  Wt 147 lb 4 oz (66.792 kg)  SpO2 99%  LMP 06/03/2015 (Approximate) Body mass index is 23.41 kg/(m^2). Gen: Afebrile. No acute distress. Toxic in appearance, well-developed, well-nourished, Caucasian female. HENT: AT. [place]. Bilateral TM visualized, no erythema. She needed for appearance.. MMM, no oral lesions. Bilateral nares erythema, no swelling. Throat without erythema or exudates. Cobblestoning present.  No cough on exam. No hoarseness on exam. Tenderness over left maxillary sinus. Eyes:Pupils Equal Round Reactive to light, Extraocular movements intact,  Conjunctiva without redness, discharge or icterus. CV: RRR, no murmur. Chest: CTAB, no wheeze or crackles. Good air movement, normal resp effort.  Abd: Soft.NTND. BS present Skin: No rashes, purpura or petechiae.  Neuro:Normal gait. PERLA. EOMi. Alert. Oriented x3  Psych: Normal affect, dress and demeanor. Normal speech. Normal thought content and judgment.  Assessment/Plan: [name] is a 50 y.o. female present for acute OV  1. Acute maxillary sinusitis, recurrence not specified - Ask [place] with patient today dizziness and fatigue may be coming from infectious process, sinuses appeared infected today. Patient with returning symptoms of head cold. Will treat with Augmentin, rest, hydration. We'll also workup fatigue with labs below. - amoxicillin-clavulanate (AUGMENTIN) 875-125 MG tablet; Take 1 tablet by mouth 2 (two) times daily.  Dispense: 20 tablet; Refill: 0  2. Other fatigue - Discussed with patient her fatigue may be secondary to sinus infection. Also may be worsening depression, although she doesn't feel this is the case and feels she is in a better place. Encouraged her to have an open discussion with her psychiatrist on her desire to come off some of the medications and her symptoms. - CBC w/Diff - Basic Metabolic Panel (BMET) - TSH - Vitamin D (25 hydroxy) - B12 - Follow-up depending upon lab results.   Cayci Mcnabb Raoul Pitch, DO  [place]

## 2015-07-31 NOTE — Patient Instructions (Signed)
Fatigue  Fatigue is feeling tired all of the time, a lack of energy, or a lack of motivation. Occasional or mild fatigue is often a normal response to activity or life in general. However, long-lasting (chronic) or extreme fatigue may indicate an underlying medical condition.  HOME CARE INSTRUCTIONS   Watch your fatigue for any changes. The following actions may help to lessen any discomfort you are feeling:  · Talk to your health care provider about how much sleep you need each night. Try to get the required amount every night.  · Take medicines only as directed by your health care provider.  · Eat a healthy and nutritious diet. Ask your health care provider if you need help changing your diet.  · Drink enough fluid to keep your urine clear or pale yellow.  · Practice ways of relaxing, such as yoga, meditation, massage therapy, or acupuncture.  · Exercise regularly.    · Change situations that cause you stress. Try to keep your work and personal routine reasonable.  · Do not abuse illegal drugs.  · Limit alcohol intake to no more than 1 drink per day for nonpregnant women and 2 drinks per day for men. One drink equals 12 ounces of beer, 5 ounces of wine, or 1½ ounces of hard liquor.  · Take a multivitamin, if directed by your health care provider.  SEEK MEDICAL CARE IF:   · Your fatigue does not get better.  · You have a fever.    · You have unintentional weight loss or gain.  · You have headaches.    · You have difficulty:      Falling asleep.    Sleeping throughout the night.  · You feel angry, guilty, anxious, or sad.     · You are unable to have a bowel movement (constipation).    · You skin is dry.     · Your legs or another part of your body is swollen.    SEEK IMMEDIATE MEDICAL CARE IF:   · You feel confused.    · Your vision is blurry.  · You feel faint or pass out.    · You have a severe headache.    · You have severe abdominal, pelvic, or back pain.    · You have chest pain, shortness of breath, or an  irregular or fast heartbeat.    · You are unable to urinate or you urinate less than normal.    · You develop abnormal bleeding, such as bleeding from the rectum, vagina, nose, lungs, or nipples.  · You vomit blood.     · You have thoughts about harming yourself or committing suicide.    · You are worried that you might harm someone else.       This information is not intended to replace advice given to you by your health care provider. Make sure you discuss any questions you have with your health care provider.     Document Released: 04/05/2007 Document Revised: 06/29/2014 Document Reviewed: 10/10/2013  Elsevier Interactive Patient Education ©2016 [employer]

## 2015-08-01 ENCOUNTER — Telehealth: Payer: Self-pay | Admitting: Family Medicine

## 2015-08-01 LAB — VITAMIN D 25 HYDROXY (VIT D DEFICIENCY, FRACTURES): VIT D 25 HYDROXY: 34 ng/mL (ref 30–100)

## 2015-08-01 NOTE — Telephone Encounter (Signed)
Please call patient: - All of her lab work is normal. Thyroid functioning normal, B-12 level normal. All electrolytes are normal. No signs of anemia. Her vitamin D level is low normal, she could benefit from increasing her vitamin D by if you're supplementing her diet, or taking an over-the-counter supplementation of 600-800 units daily.

## 2015-08-01 NOTE — Telephone Encounter (Signed)
Left message for patient to return call for lab results. 

## 2015-08-01 NOTE — Telephone Encounter (Signed)
Patient aware of results and recommendations.  Patient had no questions at this time.

## 2016-05-12 ENCOUNTER — Telehealth: Payer: Self-pay | Admitting: Family Medicine

## 2016-05-12 ENCOUNTER — Encounter: Payer: Self-pay | Admitting: Family Medicine

## 2016-05-12 ENCOUNTER — Ambulatory Visit (INDEPENDENT_AMBULATORY_CARE_PROVIDER_SITE_OTHER): Payer: BC Managed Care – PPO | Admitting: Family Medicine

## 2016-05-12 VITALS — BP 105/67 | HR 51 | Temp 98.4°F | Resp 20 | Ht 67.0 in | Wt 161.0 lb

## 2016-05-12 DIAGNOSIS — Z13220 Encounter for screening for lipoid disorders: Secondary | ICD-10-CM | POA: Diagnosis not present

## 2016-05-12 DIAGNOSIS — Z1329 Encounter for screening for other suspected endocrine disorder: Secondary | ICD-10-CM

## 2016-05-12 DIAGNOSIS — Z131 Encounter for screening for diabetes mellitus: Secondary | ICD-10-CM

## 2016-05-12 DIAGNOSIS — Z1321 Encounter for screening for nutritional disorder: Secondary | ICD-10-CM

## 2016-05-12 DIAGNOSIS — Z0000 Encounter for general adult medical examination without abnormal findings: Secondary | ICD-10-CM | POA: Diagnosis not present

## 2016-05-12 DIAGNOSIS — Z1211 Encounter for screening for malignant neoplasm of colon: Secondary | ICD-10-CM

## 2016-05-12 DIAGNOSIS — Z0189 Encounter for other specified special examinations: Secondary | ICD-10-CM

## 2016-05-12 DIAGNOSIS — R5383 Other fatigue: Secondary | ICD-10-CM

## 2016-05-12 DIAGNOSIS — Z130 Encounter for screening for diseases of the blood and blood-forming organs and certain disorders involving the immune mechanism: Secondary | ICD-10-CM

## 2016-05-12 LAB — CBC WITH DIFFERENTIAL/PLATELET
BASOS PCT: 0.6 % (ref 0.0–3.0)
Basophils Absolute: 0 10*3/uL (ref 0.0–0.1)
EOS ABS: 0.1 10*3/uL (ref 0.0–0.7)
EOS PCT: 2.2 % (ref 0.0–5.0)
HEMATOCRIT: 38.9 % (ref 36.0–46.0)
HEMOGLOBIN: 13.3 g/dL (ref 12.0–15.0)
LYMPHS PCT: 34.1 % (ref 12.0–46.0)
Lymphs Abs: 1.7 10*3/uL (ref 0.7–4.0)
MCHC: 34.2 g/dL (ref 30.0–36.0)
MCV: 100.2 fl — AB (ref 78.0–100.0)
Monocytes Absolute: 0.4 10*3/uL (ref 0.1–1.0)
Monocytes Relative: 8.2 % (ref 3.0–12.0)
NEUTROS ABS: 2.7 10*3/uL (ref 1.4–7.7)
Neutrophils Relative %: 54.9 % (ref 43.0–77.0)
PLATELETS: 179 10*3/uL (ref 150.0–400.0)
RBC: 3.89 Mil/uL (ref 3.87–5.11)
RDW: 12.9 % (ref 11.5–15.5)
WBC: 4.9 10*3/uL (ref 4.0–10.5)

## 2016-05-12 LAB — LIPID PANEL
Cholesterol: 186 mg/dL (ref 0–200)
HDL: 46.4 mg/dL (ref >39.00)
LDL CALC: 118 mg/dL — AB (ref 0–99)
NONHDL: 139.75
Total CHOL/HDL Ratio: 4
Triglycerides: 107 mg/dL (ref 0.0–149.0)
VLDL: 21.4 mg/dL (ref 0.0–40.0)

## 2016-05-12 LAB — BASIC METABOLIC PANEL
BUN: 11 mg/dL (ref 6–23)
CALCIUM: 9.4 mg/dL (ref 8.4–10.5)
CO2: 29 meq/L (ref 19–32)
CREATININE: 0.8 mg/dL (ref 0.40–1.20)
Chloride: 106 mEq/L (ref 96–112)
GFR: 80.26 mL/min (ref >60.00)
GLUCOSE: 89 mg/dL (ref 70–99)
Potassium: 4.8 mEq/L (ref 3.5–5.1)
Sodium: 142 mEq/L (ref 135–145)

## 2016-05-12 LAB — HEMOGLOBIN A1C: HEMOGLOBIN A1C: 4.9 % (ref 4.6–6.5)

## 2016-05-12 LAB — VITAMIN D 25 HYDROXY (VIT D DEFICIENCY, FRACTURES): VITD: 32.67 ng/mL (ref 30.00–100.00)

## 2016-05-12 LAB — TSH: TSH: 0.63 u[IU]/mL (ref 0.35–4.50)

## 2016-05-12 NOTE — Telephone Encounter (Signed)
Please call pt: - her labs are good. Her vit D is low normal. She should make certain she is at least taking 800 units daily with a meal.

## 2016-05-12 NOTE — Progress Notes (Signed)
Patient ID: Brenda Foster, female  DOB: 1965/06/27, 50 y.o.   MRN: [id number] Patient Care Team    Relationship Specialty Notifications Start End  Ma Hillock, DO PCP - General Family Medicine  05/12/16   Dineen Kid, DO Referring Physician   05/12/16    Comment: psych  Servando Salina, MD Consulting Physician Obstetrics and Gynecology  05/12/16     Subjective:  Brenda Foster is a 50 y.o.  Female  present for CPE. All past medical history, surgical history, allergies, family history, immunizations, medications and social history were updated in the electronic medical record today. All recent labs, ED visits and hospitalizations within the last year were reviewed.  Patient has a complaint of fatigue. She has had a full evaluation within the last 6 months for similar complaint. Discussion today is fatigue appears to have occurred after the start of propranolol, which is extended release, prescribed for a tremor that was secondary to her Prilosec use. She reports she falls asleep extremely easy in the evening time. She denies restless sleep or snoring.  Patient also complains of numbness and tingling intermittently in the morning time of her hands. She endorses waking with back discomfort that is midthoracic. She has been under the evaluation of a chiropractor for the last few months for this issue, and does not feel it is being resolved. She states that she has had x-rays and chiropractic manipulation routinely over this time.   Health maintenance:  Colonoscopy: No FHX. Referral to GI for 1st colonoscopy. Mammogram: completed:10/2014, birads 1 per pt. She has completed GYN- Dr. Garwin Brothers.   Cervical cancer screening: last pap: 10/2014, results:normal per pt, completed by: Dr. Garwin Brothers.  Immunizations: tdap UTD 2015, Influenza 2017 (encouraged yearly) Infectious disease screening: HIV completed 2015 DEXA:N/A consider at 43 for estrogen deficiency (perimenopausal) and vit d  deficiency. Takes multivitamin with Vit D only.  Assistive device: None Oxygen use: None Patient has a Dental home. Hospitalizations/ED visits: None   Immunization History  Administered Date(s) Administered  . Influenza,inj,Quad PF,36+ Mos 02/28/2014  . Tdap 06/22/1996, 04/06/2014     Past Medical History:  Diagnosis Date  . Depression    Mild n/ infertility  . History of miscarriage   . Infertility   . Migraines   . Urinary tract bacterial infections    2x  . Urine incontinence    Mild   Allergies  Allergen Reactions  . Sulfa Antibiotics Hives   Past Surgical History:  Procedure Laterality Date  . APPENDECTOMY  1980  . BREAST SURGERY  1986   Reduction  . CESAREAN SECTION  2003  . TUBAL LIGATION  2003   Family History  Problem Relation Age of Onset  . Cancer Father     prostate  . Heart disease Father 39    MI, premature  . Stroke Father     Mind  . Stroke Maternal Grandfather   . Anemia Mother   . Autism spectrum disorder Daughter   . Stroke Maternal Grandmother    Social History   Social History  . Marital status: Married    Spouse name: N/A  . Number of children: 4  . Years of education: N/A   Occupational History  . school psychologist [employer] History Main Topics  . Smoking status: Never Smoker  . Smokeless tobacco: Never Used  . Alcohol use Yes  . Drug use: No  . Sexual activity: Yes    Birth control/ protection: Surgical  Other Topics Concern  . Not on file   Social History Narrative   Lives with husband and children. Works FT as Teaching laboratory technician.     Medication List       Accurate as of 05/12/16  1:14 PM. Always use your most recent med list.          FLUoxetine 20 MG capsule Commonly known as:  PROZAC Take 40 mg by mouth daily.   multivitamin tablet Take 1 tablet by mouth daily.   propranolol ER 60 MG 24 hr capsule Commonly known as:  INDERAL LA Take 60 mg by mouth daily.        No results  found for this or any previous visit (from the past 2160 hour(s)).  Mm Digital Screening  Result Date: 05/31/2012 *RADIOLOGY REPORT* Clinical Data: Screening. DIGITAL BILATERAL SCREENING MAMMOGRAM WITH CAD  DIGITAL BREAST TOMOSYNTHESIS Digital breast tomosynthesis images are acquired in two projections.  These images are reviewed in combination with the digital mammogram, confirming the findings below. Comparison:  Previous exams. FINDINGS: ACR Breast Density Category 2: There is a scattered fibroglandular pattern. No suspicious masses, architectural distortion, or calcifications are present. Images were processed with CAD. IMPRESSION: No mammographic evidence of malignancy. A result letter of this screening mammogram will be mailed directly to the patient. RECOMMENDATION: Screening mammogram in one year. (Code:SM-B-01Y) BI-RADS CATEGORY 1:  Negative. Original Report Authenticated By: Skipper Cliche, M.D.     ROS: 14 pt review of systems performed and negative (unless mentioned in an HPI)  Objective: BP 105/67 (BP Location: Right Arm, Patient Position: Sitting, Cuff Size: Normal)   Pulse (!) 51   Temp 98.4 F (36.9 C)   Resp 20   Ht 5\' 7"  (1.702 m)   Wt 161 lb (73 kg)   SpO2 98%   BMI 25.22 kg/m  Gen: Afebrile. No acute distress. Nontoxic in appearance, well-developed, well-nourished,  pleasant Caucasian female. HENT: AT. [place]. Bilateral TM visualized and normal in appearance, normal external auditory canal. MMM, no oral lesions, adequate dentition. Bilateral nares within normal limits. Throat without erythema, ulcerations or exudates. No Cough on exam, no hoarseness on exam. Eyes:Pupils Equal Round Reactive to light, Extraocular movements intact,  Conjunctiva without redness, discharge or icterus. Neck/lymp/endocrine: Supple, no lymphadenopathy, no thyromegaly CV: RRR no murmur, no edema, +2/4 P posterior tibialis pulses. No carotid bruits. No JVD. Chest: CTAB, no wheeze, rhonchi or  crackles. Normal Respiratory effort. Good Air movement. Abd: Soft. Flat. NTND. BS present. No Masses palpated. No hepatosplenomegaly. No rebound tenderness or guarding. Skin: No rashes, purpura or petechiae. Warm and well-perfused. Skin intact. Neuro/Msk:  Normal gait. PERLA. EOMi. Alert. Oriented x3.  Cranial nerves II through XII intact. Muscle strength 5/5 upper/lower extremity. DTRs equal bilaterally. Psych: Normal affect, dress and demeanor. Normal speech. Normal thought content and judgment.  Assessment/plan: Toshiye Tappen is a 50 y.o. female present for CPE.  Encounter for preventive health examination Colonoscopy: No FHX. Referral to GI for 1st colonoscopy placed. Mammogram: completed:10/2014, birads 1 per pt. She has completed GYN- Dr. Garwin Brothers.   Cervical cancer screening: last pap: 10/2014, results:normal per pt, completed by: Dr. Garwin Brothers.  Immunizations: tdap UTD 2015, Influenza 2017 (encouraged yearly) Infectious disease screening: HIV completed 2015 DEXA:N/A consider at 30 for estrogen deficiency (perimenopausal) and vit d deficiency. Takes multivitamin with Vit D only.  Patient was encouraged to exercise greater than 150 minutes a week. Patient was encouraged to choose a diet filled with fresh fruits and vegetables, and lean  meats. AVS provided to patient today for education/recommendation on gender specific health and safety maintenance. Screening for iron deficiency anemia - CBC w/Diff Routine lab draw - Basic Metabolic Panel (BMET) Encounter for vitamin deficiency screening - Vitamin D (25 hydroxy) Screening for thyroid disorder - TSH Lipid screening - Lipid panel Screening for diabetes mellitus - HgB A1c Other fatigue: Patient to discuss propranolol need/use or changing to short acting with her psychiatrist. Labs collected today was indicated for thyroid or vitamin D disorder.  Follow-up yearly with CPE  *Patient discussing back pain and numbness and tingling in  her extremities, advised her this would need a appointment of its own to fully address appropriately. Patient will try to obtain records from chiropractor prior to appointment.   Electronically signed by: Howard Pouch, DO [place]

## 2016-05-12 NOTE — Patient Instructions (Signed)

## 2016-05-13 NOTE — Telephone Encounter (Signed)
Patient notified and verbalized understanding. 

## 2016-05-18 ENCOUNTER — Encounter: Payer: Self-pay | Admitting: Gastroenterology

## 2016-06-10 ENCOUNTER — Other Ambulatory Visit: Payer: Self-pay | Admitting: Orthopaedic Surgery

## 2016-06-10 DIAGNOSIS — M5000 Cervical disc disorder with myelopathy, unspecified cervical region: Secondary | ICD-10-CM

## 2016-06-18 ENCOUNTER — Ambulatory Visit
Admission: RE | Admit: 2016-06-18 | Discharge: 2016-06-18 | Disposition: A | Discharge status: Home / Self Care | Payer: BC Managed Care – PPO | Source: Ambulatory Visit | Attending: Orthopaedic Surgery | Admitting: Orthopaedic Surgery

## 2016-06-18 DIAGNOSIS — M5000 Cervical disc disorder with myelopathy, unspecified cervical region: Secondary | ICD-10-CM

## 2016-07-02 ENCOUNTER — Ambulatory Visit (AMBULATORY_SURGERY_CENTER): Payer: Self-pay | Admitting: *Deleted

## 2016-07-02 VITALS — Ht 67.0 in | Wt 172.0 lb

## 2016-07-02 DIAGNOSIS — Z1211 Encounter for screening for malignant neoplasm of colon: Secondary | ICD-10-CM

## 2016-07-02 MED ORDER — NA SULFATE-K SULFATE-MG SULF 17.5-3.13-1.6 GM/177ML PO SOLN: 1.0000 | Freq: Once | ORAL | 0 refills | Status: AC

## 2016-07-02 NOTE — Progress Notes (Signed)
Denies allergies to eggs or soy products. Denies complications with sedation or anesthesia. Denies O2 use. Denies use of diet or weight loss medications.  Emmi instructions given for colonoscopy.  

## 2016-07-06 ENCOUNTER — Encounter: Payer: Self-pay | Admitting: Gastroenterology

## 2016-07-16 ENCOUNTER — Encounter: Payer: Self-pay | Admitting: Gastroenterology

## 2016-07-16 ENCOUNTER — Ambulatory Visit (AMBULATORY_SURGERY_CENTER): Payer: BC Managed Care – PPO | Admitting: Gastroenterology

## 2016-07-16 VITALS — BP 106/70 | HR 51 | Temp 97.5°F | Resp 10 | Ht 67.0 in | Wt 172.0 lb

## 2016-07-16 DIAGNOSIS — D123 Benign neoplasm of transverse colon: Secondary | ICD-10-CM

## 2016-07-16 DIAGNOSIS — Z1211 Encounter for screening for malignant neoplasm of colon: Secondary | ICD-10-CM

## 2016-07-16 DIAGNOSIS — Z1212 Encounter for screening for malignant neoplasm of rectum: Secondary | ICD-10-CM | POA: Diagnosis not present

## 2016-07-16 MED ORDER — SODIUM CHLORIDE 0.9 % IV SOLN: 500.0000 mL | INTRAVENOUS | Status: DC

## 2016-07-16 NOTE — Progress Notes (Signed)
Scratch noted to right wrist area prior to I.V. Insertion,pt. Stated she got from cat.

## 2016-07-16 NOTE — Patient Instructions (Signed)
Discharge instructions given. Handouts on polyps,diverticulosis and hemorrhoids. No aspirin,ibuprofen,naproxen, or other non-steroidal anti-inflammatory drugs for 2 weeks. Resume previous medications. YOU HAD AN ENDOSCOPIC PROCEDURE TODAY AT THE [facility]:   Refer to the procedure report that was given to you for any specific questions about what was found during the examination.  If the procedure report does not answer your questions, please call your gastroenterologist to clarify.  If you requested that your care partner not be given the details of your procedure findings, then the procedure report has been included in a sealed envelope for you to review at your convenience later.  YOU SHOULD EXPECT: Some feelings of bloating in the abdomen. Passage of more gas than usual.  Walking can help get rid of the air that was put into your GI tract during the procedure and reduce the bloating. If you had a lower endoscopy (such as a colonoscopy or flexible sigmoidoscopy) you may notice spotting of blood in your stool or on the toilet paper. If you underwent a bowel prep for your procedure, you may not have a normal bowel movement for a few days.  Please Note:  You might notice some irritation and congestion in your nose or some drainage.  This is from the oxygen used during your procedure.  There is no need for concern and it should clear up in a day or so.  SYMPTOMS TO REPORT IMMEDIATELY:   Following lower endoscopy (colonoscopy or flexible sigmoidoscopy):  Excessive amounts of blood in the stool  Significant tenderness or worsening of abdominal pains  Swelling of the abdomen that is new, acute  Fever of 100F or higher   For urgent or emergent issues, a gastroenterologist can be reached at any hour by calling [phone].   DIET:  We do recommend a small meal at first, but then you may proceed to your regular diet.  Drink plenty of fluids but you should avoid alcoholic beverages  for 24 hours.  ACTIVITY:  You should plan to take it easy for the rest of today and you should NOT DRIVE or use heavy machinery until tomorrow (because of the sedation medicines used during the test).    FOLLOW UP: Our staff will call the number listed on your records the next business day following your procedure to check on you and address any questions or concerns that you may have regarding the information given to you following your procedure. If we do not reach you, we will leave a message.  However, if you are feeling well and you are not experiencing any problems, there is no need to return our call.  We will assume that you have returned to your regular daily activities without incident.  If any biopsies were taken you will be contacted by phone or by letter within the next 1-3 weeks.  Please call us at [phone] if you have not heard about the biopsies in 3 weeks.    SIGNATURES/CONFIDENTIALITY: You and/or your care partner have signed paperwork which will be entered into your electronic medical record.  These signatures attest to the fact that that the information above on your After Visit Summary has been reviewed and is understood.  Full responsibility of the confidentiality of this discharge information lies with you and/or your care-partner. 

## 2016-07-16 NOTE — Progress Notes (Signed)
Called to room to assist during endoscopic procedure.  Patient ID and intended procedure confirmed with present staff. Received instructions for my participation in the procedure from the performing physician.  

## 2016-07-16 NOTE — Progress Notes (Signed)
Report to PACU, RN, vss, BBS= Clear.  

## 2016-07-16 NOTE — Op Note (Signed)
[facility] Patient Name: Brenda Foster Procedure Date: 07/16/2016 10:59 AM MRN: [id number] Endoscopist: Mauri Pole , MD Age: 51 Referring MD:  Date of Birth: 17-Aug-1965 Gender: Female Account #: [id number] Procedure:                Colonoscopy Indications:              Screening for colorectal malignant neoplasm, This                            is the patient's first colonoscopy Medicines:                Monitored Anesthesia Care Procedure:                Pre-Anesthesia Assessment:                           - Prior to the procedure, a History and Physical                            was performed, and patient medications and                            allergies were reviewed. The patient's tolerance of                            previous anesthesia was also reviewed. The risks                            and benefits of the procedure and the sedation                            options and risks were discussed with the patient.                            All questions were answered, and informed consent                            was obtained. Prior Anticoagulants: The patient has                            taken no previous anticoagulant or antiplatelet                            agents. ASA Grade Assessment: II - A patient with                            mild systemic disease. After reviewing the risks                            and benefits, the patient was deemed in                            satisfactory condition to undergo the procedure.  After obtaining informed consent, the colonoscope                            was passed under direct vision. Throughout the                            procedure, the patient's blood pressure, pulse, and                            oxygen saturations were monitored continuously. The                            Model CF-HQ190L [id number]) scope was introduced                            through the anus  and advanced to the the terminal                            ileum, with identification of the appendiceal                            orifice and IC valve. The colonoscopy was performed                            without difficulty. The patient tolerated the                            procedure well. The quality of the bowel                            preparation was excellent. The terminal ileum,                            ileocecal valve, appendiceal orifice, and rectum                            were photographed. Scope In: 11:11:34 AM Scope Out: 11:29:29 AM Scope Withdrawal Time: 0 hours 14 minutes 1 second  Total Procedure Duration: 0 hours 17 minutes 55 seconds  Findings:                 The perianal and digital rectal examinations were                            normal.                           A 15 mm polyp was found in the transverse colon.                            The polyp was semi-sessile. The polyp was removed                            with a hot snare. The polyp was removed with a  piecemeal technique using a hot snare. Resection                            and retrieval were complete.                           A few small-mouthed diverticula were found in the                            sigmoid colon, descending colon and transverse                            colon.                           Non-bleeding internal hemorrhoids were found during                            retroflexion. The hemorrhoids were small.                           The exam was otherwise without abnormality. Complications:            No immediate complications. Estimated Blood Loss:     Estimated blood loss was minimal. Impression:               - One 15 mm polyp in the transverse colon, removed                            with a hot snare and removed piecemeal using a hot                            snare. Resected and retrieved.                           - Diverticulosis in  the sigmoid colon, in the                            descending colon and in the transverse colon.                           - Non-bleeding internal hemorrhoids.                           - The examination was otherwise normal. Recommendation:           - Patient has a contact number available for                            emergencies. The signs and symptoms of potential                            delayed complications were discussed with the                            patient. Return to normal activities tomorrow.  Written discharge instructions were provided to the                            patient.                           - Resume previous diet.                           - Continue present medications.                           - Await pathology results.                           - No aspirin, ibuprofen, naproxen, or other                            non-steroidal anti-inflammatory drugs for 2 weeks.                           - Repeat colonoscopy in 1 year for surveillance                            after piecemeal polypectomy.                           - Return to GI clinic PRN. Mauri Pole, MD 07/16/2016 11:33:59 AM This report has been signed electronically.

## 2016-07-17 ENCOUNTER — Telehealth: Payer: Self-pay | Admitting: *Deleted

## 2016-07-17 NOTE — Telephone Encounter (Signed)
LMOM

## 2016-07-27 ENCOUNTER — Encounter: Payer: Self-pay | Admitting: Gastroenterology

## 2016-08-07 ENCOUNTER — Other Ambulatory Visit: Payer: Self-pay | Admitting: Orthopaedic Surgery

## 2016-08-07 DIAGNOSIS — M4716 Other spondylosis with myelopathy, lumbar region: Secondary | ICD-10-CM

## 2016-08-22 ENCOUNTER — Ambulatory Visit
Admission: RE | Admit: 2016-08-22 | Discharge: 2016-08-22 | Disposition: A | Discharge status: Home / Self Care | Payer: BC Managed Care – PPO | Source: Ambulatory Visit | Attending: Orthopaedic Surgery | Admitting: Orthopaedic Surgery

## 2016-08-22 DIAGNOSIS — M4716 Other spondylosis with myelopathy, lumbar region: Secondary | ICD-10-CM

## 2016-12-07 ENCOUNTER — Ambulatory Visit (INDEPENDENT_AMBULATORY_CARE_PROVIDER_SITE_OTHER): Payer: BC Managed Care – PPO | Admitting: Family Medicine

## 2016-12-07 ENCOUNTER — Telehealth: Payer: Self-pay | Admitting: Family Medicine

## 2016-12-07 ENCOUNTER — Ambulatory Visit (INDEPENDENT_AMBULATORY_CARE_PROVIDER_SITE_OTHER)
Admission: RE | Admit: 2016-12-07 | Discharge: 2016-12-07 | Disposition: A | Discharge status: Home / Self Care | Payer: BC Managed Care – PPO | Source: Ambulatory Visit | Attending: Family Medicine | Admitting: Family Medicine

## 2016-12-07 ENCOUNTER — Encounter: Payer: Self-pay | Admitting: Family Medicine

## 2016-12-07 VITALS — BP 130/83 | HR 57 | Temp 98.7°F | Resp 20 | Wt 167.0 lb

## 2016-12-07 DIAGNOSIS — J40 Bronchitis, not specified as acute or chronic: Secondary | ICD-10-CM

## 2016-12-07 DIAGNOSIS — R509 Fever, unspecified: Secondary | ICD-10-CM

## 2016-12-07 DIAGNOSIS — R059 Cough, unspecified: Secondary | ICD-10-CM

## 2016-12-07 DIAGNOSIS — R05 Cough: Secondary | ICD-10-CM

## 2016-12-07 LAB — CBC WITH DIFFERENTIAL/PLATELET
BASOS ABS: 0 10*3/uL (ref 0.0–0.1)
Basophils Relative: 0.6 % (ref 0.0–3.0)
EOS PCT: 3.5 % (ref 0.0–5.0)
Eosinophils Absolute: 0.2 10*3/uL (ref 0.0–0.7)
HCT: 40 % (ref 36.0–46.0)
HEMOGLOBIN: 13.4 g/dL (ref 12.0–15.0)
Lymphocytes Relative: 30.9 % (ref 12.0–46.0)
Lymphs Abs: 1.8 10*3/uL (ref 0.7–4.0)
MCHC: 33.4 g/dL (ref 30.0–36.0)
MCV: 101.8 fl — AB (ref 78.0–100.0)
MONOS PCT: 6.5 % (ref 3.0–12.0)
Monocytes Absolute: 0.4 10*3/uL (ref 0.1–1.0)
Neutro Abs: 3.4 10*3/uL (ref 1.4–7.7)
Neutrophils Relative %: 58.5 % (ref 43.0–77.0)
Platelets: 192 10*3/uL (ref 150.0–400.0)
RBC: 3.93 Mil/uL (ref 3.87–5.11)
RDW: 12.2 % (ref 11.5–15.5)
WBC: 5.7 10*3/uL (ref 4.0–10.5)

## 2016-12-07 MED ORDER — AZITHROMYCIN 250 MG PO TABS: ORAL_TABLET | ORAL | 0 refills | Status: DC

## 2016-12-07 NOTE — Progress Notes (Signed)
Brenda Foster , Jun 29, 1965, 51 y.o., female MRN: [id number] Patient Care Team    Relationship Specialty Notifications Start End  Ma Hillock, DO PCP - General Family Medicine  05/12/16   Dineen Kid, DO Referring Physician   05/12/16    Comment: psych  Servando Salina, MD Consulting Physician Obstetrics and Gynecology  05/12/16     Chief Complaint  Patient presents with  . URI    congestion,cough, just finished 10 day amoxicillin     Subjective: Pt presents for an OV with complaints of cough of 3.5 weeks duration.  Associated symptoms include chills, sweats, intermittent productive cough, fatigue. [facility] and finished (yesterday) a 10 day course of amox 500 mg Bid. She reports her family had been ill. She doe snot feel the amox helped her at all. She is more winded and unable to complete activity as usual. Pt has tried tylenol PM, mucinex  to ease their symptoms.   Depression screen PHQ 2/9 05/12/2016  Decreased Interest 0  Down, Depressed, Hopeless 0  PHQ - 2 Score 0    Allergies  Allergen Reactions  . Sulfa Antibiotics Hives   Social History  Substance Use Topics  . Smoking status: Never Smoker  . Smokeless tobacco: Never Used  . Alcohol use 2.4 - 3.0 oz/week    4 - 5 Glasses of wine per week   Past Medical History:  Diagnosis Date  . Anxiety   . Depression    Mild n/ infertility  . History of miscarriage   . Infertility   . Migraines   . Urinary tract bacterial infections    2x  . Urine incontinence    Mild   Past Surgical History:  Procedure Laterality Date  . APPENDECTOMY  1980  . BREAST SURGERY  1986   Reduction  . CESAREAN SECTION  2003  . DILATION AND CURETTAGE OF UTERUS  1999-2002  . HYSTEROSCOPY  2001  . LAPAROSCOPY  2001  . TUBAL LIGATION  2003   Family History  Problem Relation Age of Onset  . Cancer Father        prostate  . Heart disease Father 82       MI, premature  . Stroke Father    Mind  . Stroke Maternal Grandfather   . Anemia Mother   . Autism spectrum disorder Daughter   . Stroke Maternal Grandmother    Allergies as of 12/07/2016      Reactions   Sulfa Antibiotics Hives      Medication List       Accurate as of 12/07/16  9:58 AM. Always use your most recent med list.          FLUoxetine 20 MG tablet Commonly known as:  PROZAC Take 30 mg by mouth daily.   meloxicam 15 MG tablet Commonly known as:  MOBIC Take 15 mg by mouth daily.   multivitamin tablet Take 1 tablet by mouth daily.   propranolol 10 MG tablet Commonly known as:  INDERAL Take 20 mg by mouth daily.       All past medical history, surgical history, allergies, family history, immunizations andmedications were updated in the EMR today and reviewed under the history and medication portions of their EMR.     ROS: Negative, with the exception of above mentioned in HPI   Objective:  BP 130/83 (BP Location: Right Arm, Patient Position: Sitting, Cuff Size: Normal)   Pulse (!) 57  Temp 98.7 F (37.1 C)   Resp 20   Wt 167 lb (75.8 kg)   SpO2 100%   BMI 26.16 kg/m  Body mass index is 26.16 kg/m. Gen: Afebrile. No acute distress. Nontoxic in appearance, well developed, well nourished. Very pleasant caucasian female.  HENT: AT. [place]. Bilateral TM visualized without erythema or fullness. MMM, no oral lesions. Bilateral nares without erythema or swelling. Throat without erythema or exudates. Mild PND. Cough present. No hoarseness. No TTP sinus.  Eyes:Pupils Equal Round Reactive to light, Extraocular movements intact,  Conjunctiva without redness, discharge or icterus. Neck/lymp/endocrine: Supple,no lymphadenopathy CV: RRR  Chest: CTAB, no wheeze or crackles. Good air movement, normal resp effort.  Abd: Soft. NTND. BS present.  Skin: no rashes, purpura or petechiae.  Neuro:  Normal gait. PERLA. EOMi. Alert. Oriented x3   No exam data present No results found. No results found for  this or any previous visit (from the past 24 hour(s)).  Assessment/Plan: Brenda Foster is a 51 y.o. female present for OV for  Cough/Fever, unspecified fever cause Bronchitis - Pt did break out in to a sweat during exam, otherwise normal exam. She does suffer from hot flashes as well, therefore uncertain if hot flash is from illness or hormones.  - DG Chest 2 View; Future - CBC w/Diff - azith prescribed. Rest, hydrate, mucinex DM.  - f/u depending on image/lab results.    Reviewed expectations re: course of current medical issues.  Discussed self-management of symptoms.  Outlined signs and symptoms indicating need for more acute intervention.  Patient verbalized understanding and all questions were answered.  Patient received an After-Visit Summary.   Note is dictated utilizing voice recognition software. Although note has been proof read prior to signing, occasional typographical errors still can be missed. If any questions arise, please do not hesitate to call for verification.   electronically signed by:  Howard Pouch, DO  [place]

## 2016-12-07 NOTE — Telephone Encounter (Signed)
Her blood test is normal, no signs of infection.

## 2016-12-07 NOTE — Patient Instructions (Addendum)
Mucinex DM Z- pack start today.  Rest and hydrate.   Please have chest xray completed today. I will call with results of cxr and lab work.      Please help [place] help you:  We are honored you have chosen [facility] for your [facility] home. Below you will find basic instructions that you may need to access in the future. Please help [place] help you by reading the instructions, which cover many of the frequent questions we experience.   Prescription refills and request:  -In order to allow more efficient response time, please call your pharmacy for all refills. They will forward the request electronically to [place]. This allows for the quickest possible response. Request left on a nurse line can take longer to refill, since these are checked as time allows between office patients and other phone calls.  - refill request can take up to 3-5 working days to complete.  - If request is sent electronically and request is appropiate, it is usually completed in 1-2 business days.  - all patients will need to be seen routinely for all chronic medical conditions requiring prescription medications (see follow-up below). If you are overdue for follow up on your condition, you will be asked to make an appointment and we will call in enough medication to cover you until your appointment (up to 30 days).  - all controlled substances will require a face to face visit to request/refill.  - if you desire your prescriptions to go through a new pharmacy, and have an active script at original pharmacy, you will need to call your pharmacy and have scripts transferred to new pharmacy. This is completed between the pharmacy locations and not by your provider.    Results: If any images or labs were ordered, it can take up to 1 week to get results depending on the test ordered and the lab/facility running and resulting the test. - Normal or stable results, which do not need further discussion, may be released to your  mychart immediately with attached note to you. A call may not be generated for normal results. Please make certain to sign up for mychart. If you have questions on how to activate your mychart you can call the front office.  - If your results need further discussion, our office will attempt to contact you via phone, and if unable to reach you after 2 attempts, we will release your abnormal result to your mychart with instructions.  - All results will be automatically released in mychart after 1 week.  - Your provider will provide you with explanation and instruction on all relevant material in your results. Please keep in mind, results and labs may appear confusing or abnormal to the untrained eye, but it does not mean they are actually abnormal for you personally. If you have any questions about your results that are not covered, or you desire more detailed explanation than what was provided, you should make an appointment with your provider to do so.   Our office handles many outgoing and incoming calls daily. If we have not contacted you within 1 week about your results, please check your mychart to see if there is a message first and if not, then contact our office.  In helping with this matter, you help decrease call volume, and therefore allow [place] to be able to respond to patients needs more efficiently.   Acute office visits (sick visit):  An acute visit is intended for a new  problem and are scheduled in shorter time slots to allow schedule openings for patients with new problems. This is the appropriate visit to discuss a new problem. In order to provide you with excellent quality medical care with proper time for you to explain your problem, have an exam and receive treatment with instructions, these appointments should be limited to one new problem per visit. If you experience a new problem, in which you desire to be addressed, please make an acute office visit, we save openings on the schedule to  accommodate you. Please do not save your new problem for any other type of visit, let us take care of it properly and quickly for you.   Follow up visits:  Depending on your condition(s) your provider will need to see you routinely in order to provide you with quality care and prescribe medication(s). Most chronic conditions (Example: hypertension, Diabetes, depression/anxiety... etc), require visits a couple times a year. Your provider will instruct you on proper follow up for your personal medical conditions and history. Please make certain to make follow up appointments for your condition as instructed. Failing to do so could result in lapse in your medication treatment/refills. If you request a refill, and are overdue to be seen on a condition, we will always provide you with a 30 day script (once) to allow you time to schedule.    Medicare wellness (well visit): - we have a wonderful Nurse Maudie Mercury), that will meet with you and provide you will yearly medicare wellness visits. These visits should occur yearly (can not be scheduled less than 1 calendar year apart) and cover preventive health, immunizations, advance directives and screenings you are entitled to yearly through your medicare benefits. Do not miss out on your entitled benefits, this is when medicare will pay for these benefits to be ordered for you.  These are strongly encouraged by your provider and is the appropriate type of visit to make certain you are up to date with all preventive health benefits. If you have not had your medicare wellness exam in the last 12 months, please make certain to schedule one by calling the office and schedule your medicare wellness with Maudie Mercury as soon as possible.   Yearly physical (well visit):  - Adults are recommended to be seen yearly for physicals. Check with your insurance and date of your last physical, most insurances require one calendar year between physicals. Physicals include all preventive health  topics, screenings, medical exam and labs that are appropriate for gender/age and history. You may have fasting labs needed at this visit. This is a well visit (not a sick visit), new problems should not be covered during this visit (see acute visit).  - Pediatric patients are seen more frequently when they are younger. Your provider will advise you on well child visit timing that is appropriate for your their age. - This is not a medicare wellness visit. Medicare wellness exams do not have an exam portion to the visit. Some medicare companies allow for a physical, some do not allow a yearly physical. If your medicare allows a yearly physical you can schedule the medicare wellness with our nurse Maudie Mercury and have your physical with your provider after, on the same day. Please check with insurance for your full benefits.   Late Policy/No Shows:  - all new patients should arrive 15-30 minutes earlier than appointment to allow [place] time  to  obtain all personal demographics,  insurance information and for you to complete office  paperwork. - All established patients should arrive 10-15 minutes earlier than appointment time to update all information and be checked in .  - In our best efforts to run on time, if you are late for your appointment you will be asked to either reschedule or if able, we will work you back into the schedule. There will be a wait time to work you back in the schedule,  depending on availability.  - If you are unable to make it to your appointment as scheduled, please call 24 hours ahead of time to allow [place] to fill the time slot with someone else who needs to be seen. If you do not cancel your appointment ahead of time, you may be charged a no show fee.

## 2016-12-07 NOTE — Telephone Encounter (Signed)
Please call pt: - her cxr did not show evidence of pneumonia. We will let her know when get her blood work back.

## 2016-12-07 NOTE — Telephone Encounter (Signed)
Spoke with patient reviewed xray results. 

## 2016-12-08 NOTE — Telephone Encounter (Signed)
Spoke with patient reviewed lab results. 

## 2016-12-20 DIAGNOSIS — A419 Sepsis, unspecified organism: Secondary | ICD-10-CM

## 2016-12-20 HISTORY — DX: Sepsis, unspecified organism: A41.9

## 2016-12-25 ENCOUNTER — Encounter: Payer: Self-pay | Admitting: Family Medicine

## 2016-12-25 ENCOUNTER — Ambulatory Visit (INDEPENDENT_AMBULATORY_CARE_PROVIDER_SITE_OTHER): Payer: BC Managed Care – PPO | Admitting: Family Medicine

## 2016-12-25 VITALS — BP 129/84 | HR 67 | Temp 98.2°F | Resp 20 | Wt 162.0 lb

## 2016-12-25 DIAGNOSIS — R319 Hematuria, unspecified: Secondary | ICD-10-CM | POA: Diagnosis not present

## 2016-12-25 DIAGNOSIS — N390 Urinary tract infection, site not specified: Secondary | ICD-10-CM

## 2016-12-25 DIAGNOSIS — R350 Frequency of micturition: Secondary | ICD-10-CM

## 2016-12-25 LAB — POC URINALSYSI DIPSTICK (AUTOMATED)
Glucose, UA: NEGATIVE
Nitrite, UA: NEGATIVE
PH UA: 6 (ref 5.0–8.0)
PROTEIN UA: 100
Spec Grav, UA: >=1.03 — AB (ref 1.010–1.025)
UROBILINOGEN UA: 0.2 U/dL

## 2016-12-25 MED ORDER — CEPHALEXIN 500 MG PO CAPS: 500.0000 mg | ORAL_CAPSULE | Freq: Four times a day (QID) | ORAL | 0 refills | Status: DC

## 2016-12-25 MED ORDER — PHENAZOPYRIDINE HCL 100 MG PO TABS: 100.0000 mg | ORAL_TABLET | Freq: Three times a day (TID) | ORAL | 0 refills | Status: DC | PRN

## 2016-12-25 NOTE — Patient Instructions (Signed)
ALIGN probiotic start (over the counter)  Start keflex every 6 hours for 7 days. Pyridium as needed first few days. Rest , HYDRATE!

## 2016-12-25 NOTE — Progress Notes (Signed)
Brenda Foster , [birth year], 51 y.o., female MRN: [id number] Patient Care Team    Relationship Specialty Notifications Start End  Ma Hillock, DO PCP - General Family Medicine  05/12/16   Dineen Kid, DO Referring Physician   05/12/16    Comment: psych  Servando Salina, MD Consulting Physician Obstetrics and Gynecology  05/12/16     Chief Complaint  Patient presents with  . Urinary Frequency    blood urine,started today     Subjective: Pt presents for an OV with complaints of diarrhea last night and then this morning she noticed blood in her urine and urinary frequency. She denies fever, chills, nausea or vomit.   Depression screen PHQ 2/9 05/12/2016  Decreased Interest 0  Down, Depressed, Hopeless 0  PHQ - 2 Score 0    Allergies  Allergen Reactions  . Sulfa Antibiotics Hives   Social History  Substance Use Topics  . Smoking status: Never Smoker  . Smokeless tobacco: Never Used  . Alcohol use 2.4 - 3.0 oz/week    4 - 5 Glasses of wine per week   Past Medical History:  Diagnosis Date  . Anxiety   . Depression    Mild n/ infertility  . History of miscarriage   . Infertility   . Migraines   . Urinary tract bacterial infections    2x  . Urine incontinence    Mild   Past Surgical History:  Procedure Laterality Date  . APPENDECTOMY  1980  . BREAST SURGERY  1986   Reduction  . CESAREAN SECTION  2003  . DILATION AND CURETTAGE OF UTERUS  1999-2002  . HYSTEROSCOPY  2001  . LAPAROSCOPY  2001  . TUBAL LIGATION  2003   Family History  Problem Relation Age of Onset  . Cancer Father        prostate  . Heart disease Father 27       MI, premature  . Stroke Father        Mind  . Stroke Maternal Grandfather   . Anemia Mother   . Autism spectrum disorder Daughter   . Stroke Maternal Grandmother    Allergies as of 12/25/2016      Reactions   Sulfa Antibiotics Hives      Medication List       Accurate as of 12/25/16  2:42 PM. Always use your most  recent med list.          FLUoxetine 20 MG tablet Commonly known as:  PROZAC Take 30 mg by mouth daily.   meloxicam 15 MG tablet Commonly known as:  MOBIC Take 15 mg by mouth daily.   multivitamin tablet Take 1 tablet by mouth daily.   propranolol 10 MG tablet Commonly known as:  INDERAL Take 20 mg by mouth daily.       All past medical history, surgical history, allergies, family history, immunizations andmedications were updated in the EMR today and reviewed under the history and medication portions of their EMR.     ROS: Negative, with the exception of above mentioned in HPI   Objective:  BP 129/84 (BP Location: Right Arm, Patient Position: Sitting, Cuff Size: Normal)   Pulse 67   Temp 98.2 F (36.8 C)   Resp 20   Wt 162 lb (73.5 kg)   SpO2 99%   BMI 25.37 kg/m  Body mass index is 25.37 kg/m. Gen: Afebrile. No acute distress. Nontoxic in appearance, well developed, well nourished.  HENT: AT. [place].  MMM Eyes:Pupils Equal Round Reactive to light, Extraocular movements intact,  Conjunctiva without redness, discharge or icterus. CV: RRR  Chest: CTAB, no wheeze or crackles. t.  Abd: Soft. NTND. BS present.  MSK: no cva tenderness Neuro:  Normal gait. PERLA. EOMi. Alert. Oriented x3  No exam data present No results found. Results for orders placed or performed in visit on 12/25/16 (from the past 24 hour(s))  POCT Urinalysis Dipstick (Automated)     Status: Abnormal   Collection Time: 12/25/16  2:40 PM  Result Value Ref Range   Color, UA amber    Clarity, UA turbid    Glucose, UA Negative    Bilirubin, UA Small    Ketones, UA Trace    Spec Grav, UA >=1.030 (A) 1.010 - 1.025   Blood, UA Large    pH, UA 6.0 5.0 - 8.0   Protein, UA 100    Urobilinogen, UA 0.2 0.2 or 1.0 E.U./dL   Nitrite, UA Negative    Leukocytes, UA Large (3+) (A) Negative    Assessment/Plan: Vastie Douty is a 51 y.o. female present for OV for  UTI with hematuria:  - Urine positive  for leuks and blood today.  - keflex QID x 7 days. Pyridium prescribed. Rest and hydrate.  - urine culture.  - F/U PRN or if symptoms do not improve.   Reviewed expectations re: course of current medical issues.  Discussed self-management of symptoms.  Outlined signs and symptoms indicating need for more acute intervention.  Patient verbalized understanding and all questions were answered.  Patient received an After-Visit Summary.    Orders Placed This Encounter  Procedures  . POCT Urinalysis Dipstick (Automated)     Note is dictated utilizing voice recognition software. Although note has been proof read prior to signing, occasional typographical errors still can be missed. If any questions arise, please do not hesitate to call for verification.   electronically signed by:  Howard Pouch, DO  [facility]

## 2016-12-28 ENCOUNTER — Telehealth: Payer: Self-pay | Admitting: Family Medicine

## 2016-12-28 LAB — URINE CULTURE

## 2016-12-28 NOTE — Telephone Encounter (Signed)
Please call pt: - she did have a UTI by culture. The abx prescribed will take care of it, take as prescribed every 6 hours for 7 days.

## 2016-12-28 NOTE — Telephone Encounter (Signed)
Left message with culture results and instructions on patient voice mail per [facility]

## 2017-01-06 ENCOUNTER — Inpatient Hospital Stay (HOSPITAL_COMMUNITY)
Admission: EM | Admit: 2017-01-06 | Discharge: 2017-01-12 | DRG: 872 | Disposition: A | Discharge status: Home / Self Care | Payer: BC Managed Care – PPO | Attending: Family Medicine | Admitting: Family Medicine

## 2017-01-06 ENCOUNTER — Emergency Department (HOSPITAL_COMMUNITY): Payer: BC Managed Care – PPO

## 2017-01-06 ENCOUNTER — Telehealth: Payer: Self-pay

## 2017-01-06 ENCOUNTER — Encounter (HOSPITAL_COMMUNITY): Payer: Self-pay | Admitting: *Deleted

## 2017-01-06 DIAGNOSIS — F329 Major depressive disorder, single episode, unspecified: Secondary | ICD-10-CM | POA: Diagnosis present

## 2017-01-06 DIAGNOSIS — E872 Acidosis: Secondary | ICD-10-CM | POA: Diagnosis present

## 2017-01-06 DIAGNOSIS — B9620 Unspecified Escherichia coli [E. coli] as the cause of diseases classified elsewhere: Secondary | ICD-10-CM | POA: Diagnosis present

## 2017-01-06 DIAGNOSIS — G43909 Migraine, unspecified, not intractable, without status migrainosus: Secondary | ICD-10-CM | POA: Diagnosis present

## 2017-01-06 DIAGNOSIS — R509 Fever, unspecified: Secondary | ICD-10-CM | POA: Diagnosis not present

## 2017-01-06 DIAGNOSIS — A419 Sepsis, unspecified organism: Principal | ICD-10-CM | POA: Diagnosis present

## 2017-01-06 DIAGNOSIS — Z79899 Other long term (current) drug therapy: Secondary | ICD-10-CM

## 2017-01-06 DIAGNOSIS — N12 Tubulo-interstitial nephritis, not specified as acute or chronic: Secondary | ICD-10-CM | POA: Diagnosis present

## 2017-01-06 DIAGNOSIS — M549 Dorsalgia, unspecified: Secondary | ICD-10-CM | POA: Diagnosis not present

## 2017-01-06 DIAGNOSIS — M791 Myalgia: Secondary | ICD-10-CM | POA: Diagnosis present

## 2017-01-06 DIAGNOSIS — K819 Cholecystitis, unspecified: Secondary | ICD-10-CM

## 2017-01-06 DIAGNOSIS — Z882 Allergy status to sulfonamides status: Secondary | ICD-10-CM | POA: Diagnosis not present

## 2017-01-06 DIAGNOSIS — D709 Neutropenia, unspecified: Secondary | ICD-10-CM | POA: Diagnosis present

## 2017-01-06 DIAGNOSIS — R5383 Other fatigue: Secondary | ICD-10-CM | POA: Diagnosis not present

## 2017-01-06 DIAGNOSIS — R739 Hyperglycemia, unspecified: Secondary | ICD-10-CM | POA: Diagnosis not present

## 2017-01-06 DIAGNOSIS — D696 Thrombocytopenia, unspecified: Secondary | ICD-10-CM | POA: Diagnosis not present

## 2017-01-06 DIAGNOSIS — F418 Other specified anxiety disorders: Secondary | ICD-10-CM

## 2017-01-06 DIAGNOSIS — R7989 Other specified abnormal findings of blood chemistry: Secondary | ICD-10-CM | POA: Diagnosis not present

## 2017-01-06 DIAGNOSIS — J310 Chronic rhinitis: Secondary | ICD-10-CM | POA: Diagnosis present

## 2017-01-06 DIAGNOSIS — D6959 Other secondary thrombocytopenia: Secondary | ICD-10-CM | POA: Diagnosis present

## 2017-01-06 DIAGNOSIS — R748 Abnormal levels of other serum enzymes: Secondary | ICD-10-CM | POA: Diagnosis present

## 2017-01-06 DIAGNOSIS — F419 Anxiety disorder, unspecified: Secondary | ICD-10-CM | POA: Diagnosis present

## 2017-01-06 DIAGNOSIS — I10 Essential (primary) hypertension: Secondary | ICD-10-CM | POA: Diagnosis present

## 2017-01-06 DIAGNOSIS — R05 Cough: Secondary | ICD-10-CM | POA: Diagnosis not present

## 2017-01-06 DIAGNOSIS — I959 Hypotension, unspecified: Secondary | ICD-10-CM | POA: Diagnosis present

## 2017-01-06 DIAGNOSIS — D72819 Decreased white blood cell count, unspecified: Secondary | ICD-10-CM | POA: Diagnosis not present

## 2017-01-06 DIAGNOSIS — R945 Abnormal results of liver function studies: Secondary | ICD-10-CM

## 2017-01-06 DIAGNOSIS — G8929 Other chronic pain: Secondary | ICD-10-CM | POA: Diagnosis not present

## 2017-01-06 DIAGNOSIS — R0602 Shortness of breath: Secondary | ICD-10-CM | POA: Diagnosis not present

## 2017-01-06 DIAGNOSIS — R740 Nonspecific elevation of levels of transaminase and lactic acid dehydrogenase [LDH]: Secondary | ICD-10-CM | POA: Diagnosis present

## 2017-01-06 DIAGNOSIS — E876 Hypokalemia: Secondary | ICD-10-CM

## 2017-01-06 DIAGNOSIS — B349 Viral infection, unspecified: Secondary | ICD-10-CM | POA: Diagnosis not present

## 2017-01-06 DIAGNOSIS — R42 Dizziness and giddiness: Secondary | ICD-10-CM | POA: Diagnosis not present

## 2017-01-06 DIAGNOSIS — R5081 Fever presenting with conditions classified elsewhere: Secondary | ICD-10-CM | POA: Diagnosis not present

## 2017-01-06 HISTORY — DX: Sepsis, unspecified organism: A41.9

## 2017-01-06 LAB — CBC WITH DIFFERENTIAL/PLATELET
BASOS ABS: 0 10*3/uL (ref 0.0–0.1)
BASOS PCT: 1 %
EOS ABS: 0 10*3/uL (ref 0.0–0.7)
EOS PCT: 1 %
HEMATOCRIT: 38.3 % (ref 36.0–46.0)
Hemoglobin: 13.5 g/dL (ref 12.0–15.0)
Lymphocytes Relative: 26 %
Lymphs Abs: 0.6 10*3/uL — ABNORMAL LOW (ref 0.7–4.0)
MCH: 33.8 pg (ref 26.0–34.0)
MCHC: 35.2 g/dL (ref 30.0–36.0)
MCV: 95.8 fL (ref 78.0–100.0)
MONO ABS: 0.2 10*3/uL (ref 0.1–1.0)
Monocytes Relative: 7 %
NEUTROS ABS: 1.4 10*3/uL — AB (ref 1.7–7.7)
Neutrophils Relative %: 66 %
PLATELETS: 117 10*3/uL — AB (ref 150–400)
RBC: 4 MIL/uL (ref 3.87–5.11)
RDW: 11.9 % (ref 11.5–15.5)
WBC: 2.2 10*3/uL — ABNORMAL LOW (ref 4.0–10.5)

## 2017-01-06 LAB — COMPREHENSIVE METABOLIC PANEL
ALBUMIN: 3.9 g/dL (ref 3.5–5.0)
ALK PHOS: 86 U/L (ref 38–126)
ALT: 22 U/L (ref 14–54)
ANION GAP: 11 (ref 5–15)
AST: 38 U/L (ref 15–41)
BILIRUBIN TOTAL: 0.8 mg/dL (ref 0.3–1.2)
BUN: 8 mg/dL (ref 6–20)
CALCIUM: 9.1 mg/dL (ref 8.9–10.3)
CO2: 16 mmol/L — ABNORMAL LOW (ref 22–32)
Chloride: 107 mmol/L (ref 101–111)
Creatinine, Ser: 0.89 mg/dL (ref 0.44–1.00)
GFR calc non Af Amer: >60 mL/min (ref >60)
GLUCOSE: 128 mg/dL — AB (ref 65–99)
Potassium: 3.4 mmol/L — ABNORMAL LOW (ref 3.5–5.1)
Sodium: 134 mmol/L — ABNORMAL LOW (ref 135–145)
TOTAL PROTEIN: 6.5 g/dL (ref 6.5–8.1)

## 2017-01-06 LAB — URINALYSIS, ROUTINE W REFLEX MICROSCOPIC
Bilirubin Urine: NEGATIVE
GLUCOSE, UA: NEGATIVE mg/dL
Ketones, ur: 20 mg/dL — AB
Nitrite: NEGATIVE
PH: 6 (ref 5.0–8.0)
Protein, ur: NEGATIVE mg/dL
SPECIFIC GRAVITY, URINE: 1.014 (ref 1.005–1.030)

## 2017-01-06 LAB — I-STAT TROPONIN, ED: TROPONIN I, POC: 0.01 ng/mL (ref 0.00–0.08)

## 2017-01-06 LAB — INFLUENZA PANEL BY PCR (TYPE A & B)
INFLAPCR: NEGATIVE
INFLBPCR: NEGATIVE

## 2017-01-06 LAB — I-STAT CG4 LACTIC ACID, ED
LACTIC ACID, VENOUS: 1.87 mmol/L (ref 0.5–1.9)
Lactic Acid, Venous: 3.24 mmol/L (ref 0.5–1.9)

## 2017-01-06 LAB — LACTIC ACID, PLASMA: LACTIC ACID, VENOUS: 2.3 mmol/L — AB (ref 0.5–1.9)

## 2017-01-06 LAB — CBG MONITORING, ED: Glucose-Capillary: 97 mg/dL (ref 65–99)

## 2017-01-06 MED ORDER — VANCOMYCIN HCL IN DEXTROSE 1-5 GM/200ML-% IV SOLN
1000.0000 mg | Freq: Two times a day (BID) | INTRAVENOUS | Status: DC
  Filled 2017-01-06: qty 200

## 2017-01-06 MED ORDER — ACETAMINOPHEN 650 MG RE SUPP: 650.0000 mg | Freq: Four times a day (QID) | RECTAL | Status: DC | PRN

## 2017-01-06 MED ORDER — VANCOMYCIN HCL IN DEXTROSE 1-5 GM/200ML-% IV SOLN
1000.0000 mg | Freq: Once | INTRAVENOUS | Status: AC
  Administered 2017-01-06: 1000 mg via INTRAVENOUS
  Filled 2017-01-06: qty 200

## 2017-01-06 MED ORDER — IBUPROFEN 800 MG PO TABS
800.0000 mg | ORAL_TABLET | Freq: Four times a day (QID) | ORAL | Status: DC | PRN
  Administered 2017-01-06 – 2017-01-07 (×4): 800 mg via ORAL
  Filled 2017-01-06 (×4): qty 1

## 2017-01-06 MED ORDER — PIPERACILLIN-TAZOBACTAM 3.375 G IVPB 30 MIN
3.3750 g | Freq: Once | INTRAVENOUS | Status: AC
  Administered 2017-01-06: 3.375 g via INTRAVENOUS
  Filled 2017-01-06: qty 50

## 2017-01-06 MED ORDER — LORAZEPAM 2 MG/ML IJ SOLN
0.5000 mg | Freq: Four times a day (QID) | INTRAMUSCULAR | Status: DC | PRN
  Administered 2017-01-06 – 2017-01-11 (×7): 1 mg via INTRAVENOUS
  Filled 2017-01-06 (×7): qty 1

## 2017-01-06 MED ORDER — IOPAMIDOL (ISOVUE-370) INJECTION 76%
INTRAVENOUS | Status: AC
  Administered 2017-01-06: 100 mL
  Filled 2017-01-06: qty 100

## 2017-01-06 MED ORDER — PROPRANOLOL HCL 10 MG PO TABS
10.0000 mg | ORAL_TABLET | Freq: Two times a day (BID) | ORAL | Status: DC
  Administered 2017-01-06 – 2017-01-07 (×2): 10 mg via ORAL
  Filled 2017-01-06 (×2): qty 1

## 2017-01-06 MED ORDER — ONDANSETRON HCL 4 MG/2ML IJ SOLN
4.0000 mg | Freq: Four times a day (QID) | INTRAMUSCULAR | Status: DC | PRN
  Administered 2017-01-08: 4 mg via INTRAVENOUS
  Filled 2017-01-06: qty 2

## 2017-01-06 MED ORDER — PHENAZOPYRIDINE HCL 100 MG PO TABS
100.0000 mg | ORAL_TABLET | Freq: Three times a day (TID) | ORAL | Status: DC | PRN
  Filled 2017-01-06 (×3): qty 1

## 2017-01-06 MED ORDER — SODIUM CHLORIDE 0.9 % IV BOLUS (SEPSIS)
1000.0000 mL | Freq: Once | INTRAVENOUS | Status: DC
Start: 2017-01-06 — End: 2017-01-11

## 2017-01-06 MED ORDER — ENOXAPARIN SODIUM 40 MG/0.4ML ~~LOC~~ SOLN
40.0000 mg | SUBCUTANEOUS | Status: DC
  Administered 2017-01-06: 40 mg via SUBCUTANEOUS
  Filled 2017-01-06: qty 0.4

## 2017-01-06 MED ORDER — DEXTROSE 5 % IV SOLN
2.0000 g | INTRAVENOUS | Status: DC
  Administered 2017-01-06: 2 g via INTRAVENOUS
  Filled 2017-01-06 (×2): qty 2

## 2017-01-06 MED ORDER — ACETAMINOPHEN 325 MG PO TABS
650.0000 mg | ORAL_TABLET | Freq: Four times a day (QID) | ORAL | Status: DC | PRN
  Administered 2017-01-07 – 2017-01-10 (×9): 650 mg via ORAL
  Filled 2017-01-06 (×8): qty 2

## 2017-01-06 MED ORDER — IBUPROFEN 800 MG PO TABS
800.0000 mg | ORAL_TABLET | Freq: Once | ORAL | Status: AC
  Administered 2017-01-06: 800 mg via ORAL
  Filled 2017-01-06: qty 1

## 2017-01-06 MED ORDER — SODIUM CHLORIDE 0.9 % IV SOLN
INTRAVENOUS | Status: DC
  Administered 2017-01-06: 15:00:00 via INTRAVENOUS

## 2017-01-06 MED ORDER — SODIUM CHLORIDE 0.9 % IV BOLUS (SEPSIS)
1000.0000 mL | Freq: Once | INTRAVENOUS | Status: AC
  Administered 2017-01-06: 1000 mL via INTRAVENOUS

## 2017-01-06 MED ORDER — SODIUM CHLORIDE 0.9 % IV SOLN
INTRAVENOUS | Status: DC
  Administered 2017-01-06 – 2017-01-10 (×7): via INTRAVENOUS

## 2017-01-06 MED ORDER — FLUOXETINE HCL 10 MG PO CAPS
30.0000 mg | ORAL_CAPSULE | Freq: Every day | ORAL | Status: DC
  Administered 2017-01-06 – 2017-01-12 (×7): 30 mg via ORAL
  Filled 2017-01-06 (×7): qty 3

## 2017-01-06 MED ORDER — SODIUM CHLORIDE 0.9 % IV BOLUS (SEPSIS)
500.0000 mL | Freq: Once | INTRAVENOUS | Status: AC
  Administered 2017-01-06: 500 mL via INTRAVENOUS

## 2017-01-06 MED ORDER — ONDANSETRON HCL 4 MG PO TABS: 4.0000 mg | ORAL_TABLET | Freq: Four times a day (QID) | ORAL | Status: DC | PRN

## 2017-01-06 MED ORDER — SODIUM CHLORIDE 0.9 % IV BOLUS (SEPSIS)
250.0000 mL | Freq: Once | INTRAVENOUS | Status: AC
  Administered 2017-01-06: 250 mL via INTRAVENOUS

## 2017-01-06 MED ORDER — PIPERACILLIN-TAZOBACTAM 3.375 G IVPB
3.3750 g | Freq: Three times a day (TID) | INTRAVENOUS | Status: DC
  Filled 2017-01-06 (×2): qty 50

## 2017-01-06 NOTE — Progress Notes (Signed)
Received pt from ED, A&O x4. Spouse at the bedside.  [address] noted, pt is anxious. T-100. Message sent to Dr Saralyn Pilar.

## 2017-01-06 NOTE — ED Notes (Signed)
Attempted report x1. 

## 2017-01-06 NOTE — Progress Notes (Signed)
Pharmacy Antibiotic Note Brenda Foster is a 51 y.o. female admitted on 01/06/2017 with concern for sepsis.  Pharmacy has been consulted for Zosyn and vancomycin dosing.  Plan: Vancomycin 1000 IV every 12 hours.  Goal trough 15-20 mcg/mL. Zosyn 3.375g IV q8h (4 hour infusion).  Height: 5' 6.5" (168.9 cm) Weight: 158 lb (71.7 kg) IBW/kg (Calculated) : 60.45  Temp (24hrs), Avg:100.3 F (37.9 C), Min:99.4 F (37.4 C), Max:101.2 F (38.4 C)   Recent Labs Lab 01/06/17 0838 01/06/17 0904  WBC 2.2*  --   CREATININE 0.89  --   LATICACIDVEN  --  3.24*    Estimated Creatinine Clearance: 70.6 mL/min (by C-G formula based on SCr of 0.89 mg/dL).    Allergies  Allergen Reactions  . Sulfa Antibiotics Hives    Antimicrobials this admission: 7/18 Zosyn >>  7/18 vancomycin >>   Microbiology results: 7/18 BCx: px 7/18 UCx: px  7/18 influenza: px  Thank you for allowing pharmacy to be a part of this patient's care.  Vincenza Hews, PharmD, BCPS 01/06/2017, 9:53 AM

## 2017-01-06 NOTE — Telephone Encounter (Signed)
[facility] - [place] Patient Name: Brenda Foster Gender: Female DOB: 1966-03-11 Age: 51 Y 3 D Return Phone Number: [phone] (Primary) City/State/Zip: [place] [address] Client [facility] - Client Client Site [place] Night Physician Raoul Pitch, [place] Who Is Calling Patient / Member / Family / Caregiver Call Type Triage / Clinical Relationship To Patient Self Return Phone Number [phone] (Primary) Chief Complaint BREATHING - shortness of breath or sounds breathless Reason for Call Symptomatic / Request for [place] states she is short of breath, fever 103 and dizziness Nurse Assessment Nurse: Leilani Merl, RN, [place] Date/Time (Eastern Time): 01/06/2017 7:46:05 AM Confirm and document reason for call. If symptomatic, describe symptoms. ---Caller states she is short of breath, fever 103 and dizziness. The fever started 3 days ago with the dizziness Does the PT have any chronic conditions? (i.e. diabetes, asthma, etc.) ---No Is the patient pregnant or possibly pregnant? (Ask all females between the ages of 56-55) ---No Guidelines Guideline Title Affirmed Question Dizziness - Lightheadedness Patient sounds very sick or weak to the triager Disp. Time Eilene Ghazi Time) Disposition Final User 01/06/2017 7:48:53 AM Go to ED Now (or PCP triage) Yes [place], RN, [facility] - ED Care Advice Given Per Guideline GO TO ED NOW (OR PCP TRIAGE): DRIVING: Another adult should drive. CARE ADVICE given per Dizziness (Adult) guideline.

## 2017-01-06 NOTE — ED Notes (Signed)
Given cranberry juice, taken to bathroom in wheelchair. Tolerated well.

## 2017-01-06 NOTE — ED Provider Notes (Signed)
[facility] DEPT Provider Note   CSN: [id number] Arrival date & time: 01/06/17  0820     History   Chief Complaint Chief Complaint  Patient presents with  . Shortness of Breath  . Fever    HPI Brenda Foster is a 51 y.o. female who presents emergency Department with chief complaint of fever and shortness of breath. The patient states that she developed a URI 1 month ago. She has had a persistent cough and feeling of shortness of breath since that time. She was treated with azithromycin. 12 days ago. The patient also saw her PCP because she was having hematuria. She was treated with an antibiotic and the hematuria resolved. The patient had a negative urine culture. She states that those symptoms are no longer bothering her. The patient also traveled to [place] 2 weeks ago. The patient states that 2 days ago she was walking her dog and had sudden onset of shortness of breath and fatigue. She felt as if she would not be able to return to her house. She made it home, laid down to take a nap and when she woke up, had a fever. Her symptoms have continued to worsen over the past 24 hours. Today she felt so markedly short of breath that she came to the emergency department. Other than travel about 2 weeks ago. She has no other risk factors for development of a pulmonary embolus. She has had fevers that resolve with treatment.  HPI  Past Medical History:  Diagnosis Date  . Anxiety   . Depression    Mild n/ infertility  . History of miscarriage   . Infertility   . Migraines   . Urinary tract bacterial infections    2x  . Urine incontinence    Mild    Patient Active Problem List   Diagnosis Date Noted  . Fatigue 07/31/2015  . Situational anxiety 10/26/2014  . Interstitial cystitis 12/07/2013  . Stress incontinence in female 10/12/2013  . History of depression 12/22/2012    Past Surgical History:  Procedure Laterality Date  . APPENDECTOMY  1980  . BREAST SURGERY  1986   Reduction  . CESAREAN SECTION  2003  . DILATION AND CURETTAGE OF UTERUS  1999-2002  . HYSTEROSCOPY  2001  . LAPAROSCOPY  2001  . TUBAL LIGATION  2003    OB History    No data available       Home Medications    Prior to Admission medications   Medication Sig Start Date End Date Taking? Authorizing Provider  cephALEXin (KEFLEX) 500 MG capsule Take 1 capsule (500 mg total) by mouth 4 (four) times daily. 12/25/16   Kuneff, Renee A, DO  FLUoxetine (PROZAC) 20 MG tablet Take 30 mg by mouth daily.    [provider]  meloxicam (MOBIC) 15 MG tablet Take 15 mg by mouth daily.    [provider]  Multiple Vitamin (MULTIVITAMIN) tablet Take 1 tablet by mouth daily.    [provider]  phenazopyridine (PYRIDIUM) 100 MG tablet Take 1 tablet (100 mg total) by mouth 3 (three) times daily as needed for pain. 12/25/16   Kuneff, Renee A, DO  propranolol (INDERAL) 10 MG tablet Take 20 mg by mouth daily. 11/22/16   [provider]    Family History Family History  Problem Relation Age of Onset  . Cancer Father        prostate  . Heart disease Father 78       MI, premature  .  Stroke Father        Mind  . Stroke Maternal Grandfather   . Anemia Mother   . Autism spectrum disorder Daughter   . Stroke Maternal Grandmother     Social History Social History  Substance Use Topics  . Smoking status: Never Smoker  . Smokeless tobacco: Never Used  . Alcohol use 2.4 - 3.0 oz/week    4 - 5 Glasses of wine per week     Allergies   Sulfa antibiotics   Review of Systems Review of Systems  Ten systems reviewed and are negative for acute change, except as noted in the HPI.   Physical Exam Updated Vital Signs BP (!) 116/53   Pulse 80   Temp (!) 101.2 F (38.4 C) (Rectal)   Resp 15   Ht 5' 6.5" (1.689 m)   Wt 71.7 kg (158 lb)   SpO2 100%   BMI 25.12 kg/m   Physical Exam  Constitutional: She is oriented to person, place, and time. She appears  well-developed and well-nourished. No distress.  tearful  HENT:  Head: Normocephalic and atraumatic.  Eyes: Pupils are equal, round, and reactive to light. Conjunctivae and EOM are normal. No scleral icterus.  Neck: Normal range of motion. Neck supple. No JVD present.  Cardiovascular: Normal rate, regular rhythm, normal heart sounds and intact distal pulses.  Exam reveals no gallop and no friction rub.   No murmur heard. Pulmonary/Chest: Effort normal and breath sounds normal. No respiratory distress.  Increased effort- Normal rate Appears like kussmaul breathing Lung sounds are slightly decreased on the R.  Abdominal: Soft. Bowel sounds are normal. She exhibits no distension and no mass. There is no tenderness. There is no guarding.  Neurological: She is alert and oriented to person, place, and time.  Skin: Skin is warm and dry. She is not diaphoretic.  Psychiatric: Her behavior is normal.  Nursing note and vitals reviewed.    ED Treatments / Results  Labs (all labs ordered are listed, but only abnormal results are displayed) Labs Reviewed  COMPREHENSIVE METABOLIC PANEL - Abnormal; Notable for the following:       Result Value   Sodium 134 (*)    Potassium 3.4 (*)    CO2 16 (*)    Glucose, Bld 128 (*)    All other components within normal limits  CBC WITH DIFFERENTIAL/PLATELET - Abnormal; Notable for the following:    WBC 2.2 (*)    Platelets 117 (*)    Neutro Abs 1.4 (*)    Lymphs Abs 0.6 (*)    All other components within normal limits  I-STAT CG4 LACTIC ACID, ED - Abnormal; Notable for the following:    Lactic Acid, Venous 3.24 (*)    All other components within normal limits  CULTURE, BLOOD (ROUTINE X 2)  CULTURE, BLOOD (ROUTINE X 2)  INFLUENZA PANEL BY PCR (TYPE A & B)  URINALYSIS, ROUTINE W REFLEX MICROSCOPIC  I-STAT CG4 LACTIC ACID, ED  CBG MONITORING, ED    EKG  EKG Interpretation  Date/Time:  Wednesday January 06 2017 08:30:49 EDT Ventricular Rate:  88 PR  Interval:    QRS Duration: 100 QT Interval:  364 QTC Calculation: 441 R Axis:   89 Text Interpretation:  Sinus rhythm Abnormal right axis deviation No old tracing to compare Confirmed by Merrily Pew [phone]) on 01/06/2017 9:44:11 AM       Radiology Dg Chest 2 View  Result Date: 01/06/2017 CLINICAL DATA:  Shortness of breath since  Monday EXAM: CHEST  2 VIEW COMPARISON:  12/07/2016 FINDINGS: EKG leads create artifact over the chest. Normal heart size and mediastinal contours. No acute infiltrate or edema. No effusion or pneumothorax. No acute osseous findings. IMPRESSION: No evidence of active disease. Electronically Signed   By: Monte Fantasia M.D.   On: 01/06/2017 09:22    Procedures Procedures (including critical care time)  Medications Ordered in ED Medications  sodium chloride 0.9 % bolus 1,000 mL (not administered)  sodium chloride 0.9 % bolus 1,000 mL (1,000 mLs Intravenous New Bag/Given 01/06/17 0927)    And  sodium chloride 0.9 % bolus 1,000 mL (1,000 mLs Intravenous New Bag/Given 01/06/17 0946)    And  sodium chloride 0.9 % bolus 250 mL (not administered)  vancomycin (VANCOCIN) IVPB 1000 mg/200 mL premix (1,000 mg Intravenous New Bag/Given 01/06/17 0939)  piperacillin-tazobactam (ZOSYN) IVPB 3.375 g (not administered)  piperacillin-tazobactam (ZOSYN) IVPB 3.375 g (3.375 g Intravenous New Bag/Given 01/06/17 0939)  ibuprofen (ADVIL,MOTRIN) tablet 800 mg (800 mg Oral Given 01/06/17 0956)     Initial Impression / Assessment and Plan / ED Course  I have reviewed the triage vital signs and the nursing notes.  Pertinent labs & imaging results that were available during my care of the patient were reviewed by me and considered in my medical decision making (see chart for details).  Clinical Course as of Jan 06 1025  Wed Jan 06, 2017  0948 WBC: (!) 2.2 [AH]  0948 Platelets: (!) 117 [AH]  5573 NEUT#: (!) 1.4 [AH]  2202 Pancytopenia Lymphocyte #: (!) 0.6 [AH]  5427 Patient    [AH]    Clinical Course User Index [AH] Margarita Mail, PA-C    Patient appears to have a urinary tract infection. She's been treated for sepsis. She appears greatly improved after reduction of her fever and fluid rehydration. The patient will be admitted. She appears stable throughout her visit.  Final Clinical Impressions(s) / ED Diagnoses   Final diagnoses:  Sepsis, due to unspecified organism [facility])    New Prescriptions New Prescriptions   No medications on file     Margarita Mail, PA-C 01/06/17 1529    Mesner, Corene Cornea, MD 01/08/17 [phone]

## 2017-01-06 NOTE — Progress Notes (Signed)
Notified Dr Hal Hope of lactic acid result of 2.3. New orders given.

## 2017-01-06 NOTE — ED Provider Notes (Signed)
Medical screening examination/treatment/procedure(s) were conducted as a shared visit with non-physician practitioner(s) and myself.  I personally evaluated the patient during the encounter.  50 year old female without history of past medical history presents to the emergency department today secondary to dyspnea. Patient has had a couple treatments for upper extremity infections but has not got better and acutely worsened today. My exam she is tachypneic without any wheezes rhonchi or rales. Heart rate was near 100 but blood pressure is normal. She is not hypoxic but did have improved tachypnea with nasal cannula. She is febrile. No lower extremity swelling. Patient regarding code sepsis antibiotics. Suspect possible PE similar CTA as well. Either way patient will need to be admitted for failure of outpatient antibiotics versus pulmonary embolus versus new-onset heart failure or myocarditis.    EKG Interpretation  Date/Time:  Wednesday January 06 2017 09:47:02 EDT Ventricular Rate:  86 PR Interval:    QRS Duration: 102 QT Interval:  424 QTC Calculation: 508 R Axis:   87 Text Interpretation:  Sinus rhythm Borderline prolonged QT interval No significant change since last tracing Confirmed by Gareth Morgan [phone]) on 01/07/2017 10:18:00 PM         Charitie Hinote, Corene Cornea, MD 01/08/17 [id number]

## 2017-01-06 NOTE — ED Notes (Signed)
Patient transported to X-ray 

## 2017-01-06 NOTE — ED Triage Notes (Signed)
Pt states has been tx for upper resp infection twice in the last month.  Monday while walking dog she became sob and had to lay down.  States fever of 102 at home and tool asa this am.

## 2017-01-06 NOTE — Telephone Encounter (Signed)
Patient is currently at the ER for evaluation.

## 2017-01-06 NOTE — H&P (Signed)
History and Physical    [name] FXT:[id number] DOB: 07-07-1965 DOA: 01/06/2017  PCP: Ma Hillock, DO Patient coming from: home  Chief Complaint: SOB  HPI: Brenda Foster is a 51 y.o. female with medical history significant of anxiety/depression, migraines. Patient's initial presenting complaint was for fever and shortness of breath. Patient states that her shortness of breath started approximately 4 weeks ago after developing a URI. She was treated with azithromycin around that time which improved symptoms significantly. Approximately 12 days ago patient was seen by her PCP for hematuria and frequency. She was diagnosed and properly treated with Keflex 4 times a day. Symptoms initially improved. Approximately 2 days prior to admission patient developed significant worsening fatigue with associated shortness of breath. Shortness of breath is independent of exertion or any other activity. Patient states that shortness of breath is constant and only relieved when sleeping. Patient has no associated chest pain palpitations Ms., headache, focal neurological deficit, abdominal pain. Patient also complaining of fevers as high as 103. No return of hematuria.  ED Course: Noted to have UTI. Treated for sepsis with vancomycin and Zosyn. CTA ordered for shortness of breath which was unremarkable.  Review of Systems: As per HPI otherwise all other systems reviewed and are negative  Ambulatory Status: No restrictions  Past Medical History:  Diagnosis Date  . Anxiety   . Depression    Mild n/ infertility  . History of miscarriage   . Infertility   . Migraines   . Sepsis ([place]) 12/2016  . Urinary tract bacterial infections    2x  . Urine incontinence    Mild    Past Surgical History:  Procedure Laterality Date  . APPENDECTOMY  1980  . BREAST SURGERY  1986   Reduction  . CESAREAN SECTION  2003  . DILATION AND CURETTAGE OF UTERUS  1999-2002  . HYSTEROSCOPY  2001  . LAPAROSCOPY   2001  . TUBAL LIGATION  2003    Social History   Social History  . Marital status: Married    Spouse name: N/A  . Number of children: 4  . Years of education: N/A   Occupational History  . school psychologist [place] History Main Topics  . Smoking status: Never Smoker  . Smokeless tobacco: Never Used  . Alcohol use 2.4 - 3.0 oz/week    4 - 5 Glasses of wine per week  . Drug use: No  . Sexual activity: Yes    Birth control/ protection: Surgical   Other Topics Concern  . Not on file   Social History Narrative   Lives with husband and children. 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    Allergies  Allergen Reactions  . Sulfa Antibiotics Hives    Family History  Problem Relation Age of Onset  . Cancer Father        prostate  . Heart disease Father 75       MI, premature  . Stroke Father        Mind  . Stroke Maternal Grandfather   . Anemia Mother   . Autism spectrum disorder Daughter   . Stroke Maternal Grandmother       Prior to Admission medications   Medication Sig Start Date End Date Taking? Authorizing Provider  aspirin 325 MG tablet Take 325 mg by mouth every 6 (six) hours as needed for mild pain.   Yes [provider]  FLUoxetine (PROZAC) 20 MG tablet Take 30 mg by mouth daily.  Yes [provider]  ibuprofen (ADVIL,MOTRIN) 200 MG tablet Take 200 mg by mouth every 6 (six) hours as needed for moderate pain.   Yes [provider]  meloxicam (MOBIC) 15 MG tablet Take 15 mg by mouth daily.   Yes [provider]  Multiple Vitamin (MULTIVITAMIN) tablet Take 1 tablet by mouth daily.   Yes [provider]  Probiotic Product (PROBIOTIC PO) Take 1 tablet by mouth daily.   Yes [provider]  propranolol (INDERAL) 10 MG tablet Take 10 mg by mouth 2 (two) times daily.  11/22/16  Yes [provider]  Pseudoeph-CPM-DM-APAP (NIGHTTIME COLD/COUGH PO) Take 1 capsule by mouth at bedtime as needed  (cold).   Yes [provider]  cephALEXin (KEFLEX) 500 MG capsule Take 1 capsule (500 mg total) by mouth 4 (four) times daily. Patient not taking: Reported on 01/06/2017 12/25/16   Howard Pouch A, DO  phenazopyridine (PYRIDIUM) 100 MG tablet Take 1 tablet (100 mg total) by mouth 3 (three) times daily as needed for pain. Patient not taking: Reported on 01/06/2017 12/25/16   Ma Hillock, DO    Physical Exam: Vitals:   01/06/17 1442 01/06/17 1515 01/06/17 1600 01/06/17 1625  BP: 107/68 109/65 105/68 105/68  Pulse: 76 76 74 73  Resp:  16 15 16   Temp:    99.5 F (37.5 C)  TempSrc:    Oral  SpO2: 99% 100% 100% 100%  Weight:      Height:         General:  Appears calm and comfortable Eyes:  PERRL, EOMI, normal lids, iris ENT:  grossly normal hearing, lips & tongue, mmm Neck:  no LAD, masses or thyromegaly Cardiovascular:  RRR, no m/r/g. No LE edema.  Respiratory:  CTA bilaterally, no w/r/r. Normal respiratory effort. Abdomen:  soft, ntnd, NABS, no suprapubic tenderness Skin:  no rash or induration seen on limited exam Musculoskeletal:  grossly normal tone BUE/BLE, good ROM, no bony abnormality, no CVA tenderness Psychiatric:  grossly normal mood and affect, speech fluent and appropriate, AOx3 Neurologic:  CN 2-12 grossly intact, moves all extremities in coordinated fashion, sensation intact  Labs on Admission: I have personally reviewed following labs and imaging studies  CBC:  Recent Labs Lab 01/06/17 0838  WBC 2.2*  NEUTROABS 1.4*  HGB 13.5  HCT 38.3  MCV 95.8  PLT 536*   Basic Metabolic Panel:  Recent Labs Lab 01/06/17 0838  NA 134*  K 3.4*  CL 107  CO2 16*  GLUCOSE 128*  BUN 8  CREATININE 0.89  CALCIUM 9.1   GFR: Estimated Creatinine Clearance: 70.6 mL/min (by C-G formula based on SCr of 0.89 mg/dL). Liver Function Tests:  Recent Labs Lab 01/06/17 0838  AST 38  ALT 22  ALKPHOS 86  BILITOT 0.8  PROT 6.5  ALBUMIN 3.9   No results for  input(s): LIPASE, AMYLASE in the last 168 hours. No results for input(s): AMMONIA in the last 168 hours. Coagulation Profile: No results for input(s): INR, PROTIME in the last 168 hours. Cardiac Enzymes: No results for input(s): CKTOTAL, CKMB, CKMBINDEX, TROPONINI in the last 168 hours. BNP (last 3 results) No results for input(s): PROBNP in the last 8760 hours. HbA1C: No results for input(s): HGBA1C in the last 72 hours. CBG:  Recent Labs Lab 01/06/17 0934  GLUCAP 97   Lipid Profile: No results for input(s): CHOL, HDL, LDLCALC, TRIG, CHOLHDL, LDLDIRECT in the last 72 hours. Thyroid Function Tests: No results for input(s): TSH,  T4TOTAL, FREET4, T3FREE, THYROIDAB in the last 72 hours. Anemia Panel: No results for input(s): VITAMINB12, FOLATE, FERRITIN, TIBC, IRON, RETICCTPCT in the last 72 hours. Urine analysis:    Component Value Date/Time   COLORURINE YELLOW 01/06/2017 1114   APPEARANCEUR HAZY (A) 01/06/2017 1114   LABSPEC 1.014 01/06/2017 1114   PHURINE 6.0 01/06/2017 1114   GLUCOSEU NEGATIVE 01/06/2017 1114   HGBUR SMALL (A) 01/06/2017 1114   BILIRUBINUR NEGATIVE 01/06/2017 1114   BILIRUBINUR Small 12/25/2016 1440   KETONESUR 20 (A) 01/06/2017 1114   PROTEINUR NEGATIVE 01/06/2017 1114   UROBILINOGEN 0.2 12/25/2016 1440   NITRITE NEGATIVE 01/06/2017 1114   LEUKOCYTESUR LARGE (A) 01/06/2017 1114    Creatinine Clearance: Estimated Creatinine Clearance: 70.6 mL/min (by C-G formula based on SCr of 0.89 mg/dL).  Sepsis Labs: @LABRCNTIP (procalcitonin:4,lacticidven:4) )No results found for this or any previous visit (from the past 240 hour(s)).   Radiological Exams on Admission: Dg Chest 2 View  Result Date: 01/06/2017 CLINICAL DATA:  Shortness of breath since Monday EXAM: CHEST  2 VIEW COMPARISON:  12/07/2016 FINDINGS: EKG leads create artifact over the chest. Normal heart size and mediastinal contours. No acute infiltrate or edema. No effusion or pneumothorax. No acute  osseous findings. IMPRESSION: No evidence of active disease. Electronically Signed   By: Monte Fantasia M.D.   On: 01/06/2017 09:22   Ct Angio Chest Pe W And/or Wo Contrast  Result Date: 01/06/2017 CLINICAL DATA:  Dizziness and shortness of breath for 3 days. EXAM: CT ANGIOGRAPHY CHEST WITH CONTRAST TECHNIQUE: Multidetector CT imaging of the chest was performed using the standard protocol during bolus administration of intravenous contrast. Multiplanar CT image reconstructions and MIPs were obtained to evaluate the vascular anatomy. CONTRAST:  100 cc Isovue 370 intravenous COMPARISON:  None available FINDINGS: Cardiovascular: Satisfactory opacification of the pulmonary arteries to the segmental level. Intermittent motion and streak artifact with no evidence of pulmonary embolism. Normal heart size. No pericardial effusion. Mediastinum/Nodes: Negative for mass or adenopathy Lungs/Pleura: There is no edema, consolidation, effusion, or pneumothorax. Upper Abdomen: Negative Musculoskeletal: No acute or aggressive finding Review of the MIP images confirms the above findings. IMPRESSION: No evidence of pulmonary embolism.  No explanation for symptoms. Electronically Signed   By: Monte Fantasia M.D.   On: 01/06/2017 11:13    EKG: Independently reviewed.  Assessment/Plan Active Problems:   Situational anxiety   Fatigue   Sepsis ([place])   Pyelonephritis   Hyperglycemia   Hypokalemia   Urosepsis: UA frankly positive. Likely secondary to incomplete treatment of previous UTI though sensitivities suggest that patient was adequately treated. Sepsis protocol initiated in the ED. Urine culture obtained by PCP at time of treatment of previous UTI shows Escherichia coli at greater than 100,000 colonies with fairly broad sensitivity. - Stop vancomycin and Zosyn and start ceftriaxone - Aggressive IVF - Follow-up blood cultures urine cultures - Sepsis protocol  Shortness of breath: Likely secondary to severe  anxiety. CTA without evidence of acute thoracic condition with normal EKG and negative troponin. No cardiac history. - Ativan when necessary - Treatment of sepsis as above - Continue Prozac for depression.  Leukopenia and thrombocytopenia: Likely secondary to infectious process - CBC with differential in a.m.  Hyperglycemia: 128 on admission. Likely due to acute stress/infection - A1c  HypoK: 3.4.  - Kdur 40 x1   DVT prophylaxis: Lovenox  Code Status: full  Family Communication: husband  Disposition Plan: pending improvement   Consults called: none  Admission status: inpt  MERRELL, DAVID J MD [facility]  If 7PM-7AM, please contact night-coverage [email or web address] Password [facility]  01/06/2017, 5:40 PM

## 2017-01-06 NOTE — ED Notes (Signed)
Dr. Marily Memos at the bedside.

## 2017-01-07 ENCOUNTER — Inpatient Hospital Stay (HOSPITAL_COMMUNITY): Payer: BC Managed Care – PPO

## 2017-01-07 DIAGNOSIS — I959 Hypotension, unspecified: Secondary | ICD-10-CM

## 2017-01-07 DIAGNOSIS — A419 Sepsis, unspecified organism: Principal | ICD-10-CM

## 2017-01-07 DIAGNOSIS — R739 Hyperglycemia, unspecified: Secondary | ICD-10-CM

## 2017-01-07 LAB — CBC WITH DIFFERENTIAL/PLATELET
BASOS PCT: 1 %
BASOS PCT: 1 %
Basophils Absolute: 0 10*3/uL (ref 0.0–0.1)
Basophils Absolute: 0 10*3/uL (ref 0.0–0.1)
EOS PCT: 0 %
EOS PCT: 0 %
Eosinophils Absolute: 0 10*3/uL (ref 0.0–0.7)
Eosinophils Absolute: 0 10*3/uL (ref 0.0–0.7)
HCT: 31.8 % — ABNORMAL LOW (ref 36.0–46.0)
HCT: 31.9 % — ABNORMAL LOW (ref 36.0–46.0)
HEMATOCRIT: 33.4 % — AB (ref 36.0–46.0)
Hemoglobin: 11.1 g/dL — ABNORMAL LOW (ref 12.0–15.0)
Hemoglobin: 11.1 g/dL — ABNORMAL LOW (ref 12.0–15.0)
Hemoglobin: 10.6 g/dL — ABNORMAL LOW (ref 12.0–15.0)
LYMPHS ABS: 0.3 10*3/uL — AB (ref 0.7–4.0)
Lymphocytes Relative: 23 %
Lymphocytes Relative: 23 %
Lymphs Abs: 0.5 10*3/uL — ABNORMAL LOW (ref 0.7–4.0)
MCH: 32.4 pg (ref 26.0–34.0)
MCH: 33.8 pg (ref 26.0–34.0)
MCH: 32.3 pg (ref 26.0–34.0)
MCHC: 33.2 g/dL (ref 30.0–36.0)
MCHC: 34.9 g/dL (ref 30.0–36.0)
MCHC: 33.2 g/dL (ref 30.0–36.0)
MCV: 97.4 fL (ref 78.0–100.0)
MCV: 97 fL (ref 78.0–100.0)
MCV: 97.3 fL (ref 78.0–100.0)
MONO ABS: 0.3 10*3/uL (ref 0.1–1.0)
MONO ABS: 0 10*3/uL — AB (ref 0.1–1.0)
MONOS PCT: 12 %
Monocytes Relative: 4 %
NEUTROS ABS: 1.4 10*3/uL — AB (ref 1.7–7.7)
NEUTROS ABS: 0.9 10*3/uL — AB (ref 1.7–7.7)
Neutrophils Relative %: 65 %
Neutrophils Relative %: 72 %
PLATELETS: 72 10*3/uL — AB (ref 150–400)
PLATELETS: 60 10*3/uL — AB (ref 150–400)
PLATELETS: 60 10*3/uL — AB (ref 150–400)
RBC: 3.43 MIL/uL — ABNORMAL LOW (ref 3.87–5.11)
RBC: 3.28 MIL/uL — ABNORMAL LOW (ref 3.87–5.11)
RBC: 3.28 MIL/uL — ABNORMAL LOW (ref 3.87–5.11)
RDW: 12 % (ref 11.5–15.5)
RDW: 12.1 % (ref 11.5–15.5)
RDW: 12 % (ref 11.5–15.5)
WBC MORPHOLOGY: INCREASED
WBC: 2.1 10*3/uL — ABNORMAL LOW (ref 4.0–10.5)
WBC: 1.2 10*3/uL — CL (ref 4.0–10.5)

## 2017-01-07 LAB — BASIC METABOLIC PANEL
Anion gap: 6 (ref 5–15)
BUN: 6 mg/dL (ref 6–20)
CALCIUM: 7.8 mg/dL — AB (ref 8.9–10.3)
CO2: 20 mmol/L — AB (ref 22–32)
CREATININE: 0.8 mg/dL (ref 0.44–1.00)
Chloride: 113 mmol/L — ABNORMAL HIGH (ref 101–111)
GFR calc Af Amer: >60 mL/min (ref >60)
GFR calc non Af Amer: >60 mL/min (ref >60)
Glucose, Bld: 103 mg/dL — ABNORMAL HIGH (ref 65–99)
Potassium: 3.7 mmol/L (ref 3.5–5.1)
Sodium: 139 mmol/L (ref 135–145)

## 2017-01-07 LAB — DIC (DISSEMINATED INTRAVASCULAR COAGULATION) PANEL
APTT: 34 s (ref 24–36)
D DIMER QUANT: 3.06 ug{FEU}/mL — AB (ref 0.00–0.50)
FIBRINOGEN: 281 mg/dL (ref 210–475)
INR: 1.19
PROTHROMBIN TIME: 15.2 s (ref 11.4–15.2)

## 2017-01-07 LAB — URINE CULTURE

## 2017-01-07 LAB — PROTIME-INR
INR: 1.18
PROTHROMBIN TIME: 15.1 s (ref 11.4–15.2)

## 2017-01-07 LAB — DIC (DISSEMINATED INTRAVASCULAR COAGULATION)PANEL
Platelets: 58 10*3/uL — ABNORMAL LOW (ref 150–400)
Smear Review: NONE SEEN

## 2017-01-07 LAB — LACTIC ACID, PLASMA: Lactic Acid, Venous: 1.1 mmol/L (ref 0.5–1.9)

## 2017-01-07 MED ORDER — SODIUM CHLORIDE 0.9% FLUSH: 3.0000 mL | INTRAVENOUS | Status: DC | PRN

## 2017-01-07 MED ORDER — VANCOMYCIN HCL IN DEXTROSE 1-5 GM/200ML-% IV SOLN: 1000.0000 mg | Freq: Two times a day (BID) | INTRAVENOUS | Status: DC

## 2017-01-07 MED ORDER — SODIUM CHLORIDE 0.9 % IV SOLN: 250.0000 mL | INTRAVENOUS | Status: DC | PRN

## 2017-01-07 MED ORDER — SODIUM CHLORIDE 0.9% FLUSH
3.0000 mL | Freq: Two times a day (BID) | INTRAVENOUS | Status: DC
  Administered 2017-01-08 (×2): 3 mL via INTRAVENOUS

## 2017-01-07 MED ORDER — VANCOMYCIN HCL IN DEXTROSE 1-5 GM/200ML-% IV SOLN
1000.0000 mg | Freq: Once | INTRAVENOUS | Status: AC
  Administered 2017-01-07: 1000 mg via INTRAVENOUS
  Filled 2017-01-07: qty 200

## 2017-01-07 MED ORDER — DOXYCYCLINE HYCLATE 100 MG PO TABS: 100.0000 mg | ORAL_TABLET | Freq: Two times a day (BID) | ORAL | Status: DC

## 2017-01-07 MED ORDER — KETOROLAC TROMETHAMINE 15 MG/ML IJ SOLN
15.0000 mg | Freq: Four times a day (QID) | INTRAMUSCULAR | Status: DC
  Administered 2017-01-07 – 2017-01-08 (×3): 15 mg via INTRAVENOUS
  Filled 2017-01-07 (×3): qty 1

## 2017-01-07 MED ORDER — DOXYCYCLINE HYCLATE 100 MG IV SOLR
100.0000 mg | Freq: Two times a day (BID) | INTRAVENOUS | Status: DC
  Administered 2017-01-07 – 2017-01-10 (×7): 100 mg via INTRAVENOUS
  Filled 2017-01-07 (×8): qty 100

## 2017-01-07 MED ORDER — PIPERACILLIN-TAZOBACTAM 3.375 G IVPB
3.3750 g | Freq: Three times a day (TID) | INTRAVENOUS | Status: DC
  Administered 2017-01-07 – 2017-01-11 (×12): 3.375 g via INTRAVENOUS
  Filled 2017-01-07 (×13): qty 50

## 2017-01-07 NOTE — Progress Notes (Signed)
CRITICAL VALUE ALERT  Critical Value:  WBC 1.2  Date & Time Notied:  01/07/2017 1828  Provider Notified: Dr. Verlon Au  Orders Received/Actions taken: 1840, orders received

## 2017-01-07 NOTE — Progress Notes (Addendum)
PROGRESS NOTE    Brenda Foster  HUD:[id number] DOB: [birth year] DOA: 01/06/2017 PCP: Ma Hillock, DO  Outpatient Specialists:     Brief Narrative:  51 y/o ? Bipolar migraines Recent Rx for UTI-Keflex for e coli completed Rx Has had also a prolonged rhinitis Recent travel [place] Has had HA and weakness as main presenting c/o  Admitted with presumed UTI cxr neg No meningismus    Assessment & Plan:   Active Problems:   Situational anxiety   Fatigue   Sepsis ([place])   Pyelonephritis   Hyperglycemia   Hypokalemia   Sepsis Etiology could b viral or atypical but the index suspicion for hematological causes--this is improving only fairly  Etiology unclear. Urine culture less than 10,000 colony-forming units  Discontinued ceftriaxone-resume vancomycin as well as Zosyn for now--we have had to escalate antibiotics because of unclear source  Repeat CBC plus differential now to rule out worsening thrombocytopenia  Await platelet smear, HIV  Thrombocytopenia-platelets have dropped from the 120 140 range to 70 Mild neutropenia-white count is now 2  Possibly related to atypical infection as above  The above discussion  ? Interstitial nephritis  Unclear this is an actual diagnosis she has   Hypertension  Continue propanolol 10 twice a day  Bipolar  Continue Prozac 30, Ativan 0.5-1 mg every 6 when necessary anxiety  DC Lovenox as mild thrombocytopenia SCDs Discussed with husband on phone Inpatient    Consultants:   None yet  Procedures:     Antimicrobials:   Vancomycin + Zosyn 7/18    Subjective:  Alert feels better feels stronger however still a little bit dizzy Eating and drinking some No chest pain No nausea no vomiting no dysuria no menorrhagia no vaginal discharge no neck stiffness no blurred vision no double vision   Objective: Vitals:   01/07/17 0632 01/07/17 0823 01/07/17 0829 01/07/17 1221  BP: (!) 100/56  99/72   Pulse: 84  80     Resp: 18     Temp: 100 F (37.8 C) 99 F (37.2 C)  (!) 100.4 F (38 C)  TempSrc:  Oral    SpO2: 97% 100%  100%  Weight:      Height:        Intake/Output Summary (Last 24 hours) at 01/07/17 1419 Last data filed at 01/07/17 0900  Gross per 24 hour  Intake             1940 ml  Output              450 ml  Net             1490 ml   Filed Weights   01/06/17 0835  Weight: 71.7 kg (158 lb)    Examination:  Alert looks about stated age in no icterus no pallor external ocular movements intact Neck is soft and supple Chest is clinically clear Abdomen soft nontender no rebound or guarding No lower extremity effusion or swelling no lower extremity edema S1-S2 no murmur.   Data Reviewed: I have personally reviewed following labs and imaging studies  CBC:  Recent Labs Lab 01/06/17 0838 01/07/17 0119  WBC 2.2* 2.1*  NEUTROABS 1.4* 1.4*  HGB 13.5 11.1*  HCT 38.3 33.4*  MCV 95.8 97.4  PLT 117* 72*   Basic Metabolic Panel:  Recent Labs Lab 01/06/17 0838 01/07/17 0119  NA 134* 139  K 3.4* 3.7  CL 107 113*  CO2 16* 20*  GLUCOSE 128* 103*  BUN 8 6  CREATININE 0.89 0.80  CALCIUM 9.1 7.8*   GFR: Estimated Creatinine Clearance: 78.6 mL/min (by C-G formula based on SCr of 0.8 mg/dL). Liver Function Tests:  Recent Labs Lab 01/06/17 0838  AST 38  ALT 22  ALKPHOS 86  BILITOT 0.8  PROT 6.5  ALBUMIN 3.9   No results for input(s): LIPASE, AMYLASE in the last 168 hours. No results for input(s): AMMONIA in the last 168 hours. Coagulation Profile:  Recent Labs Lab 01/07/17 0821  INR 1.18   Cardiac Enzymes: No results for input(s): CKTOTAL, CKMB, CKMBINDEX, TROPONINI in the last 168 hours. BNP (last 3 results) No results for input(s): PROBNP in the last 8760 hours. HbA1C: No results for input(s): HGBA1C in the last 72 hours. CBG:  Recent Labs Lab 01/06/17 0934  GLUCAP 97   Lipid Profile: No results for input(s): CHOL, HDL, LDLCALC, TRIG, CHOLHDL,  LDLDIRECT in the last 72 hours. Thyroid Function Tests: No results for input(s): TSH, T4TOTAL, FREET4, T3FREE, THYROIDAB in the last 72 hours. Anemia Panel: No results for input(s): VITAMINB12, FOLATE, FERRITIN, TIBC, IRON, RETICCTPCT in the last 72 hours. Urine analysis:    Component Value Date/Time   COLORURINE YELLOW 01/06/2017 1114   APPEARANCEUR HAZY (A) 01/06/2017 1114   LABSPEC 1.014 01/06/2017 1114   PHURINE 6.0 01/06/2017 1114   GLUCOSEU NEGATIVE 01/06/2017 1114   HGBUR SMALL (A) 01/06/2017 1114   BILIRUBINUR NEGATIVE 01/06/2017 1114   BILIRUBINUR Small 12/25/2016 1440   KETONESUR 20 (A) 01/06/2017 1114   PROTEINUR NEGATIVE 01/06/2017 1114   UROBILINOGEN 0.2 12/25/2016 1440   NITRITE NEGATIVE 01/06/2017 1114   LEUKOCYTESUR LARGE (A) 01/06/2017 1114   Sepsis Labs: @LABRCNTIP (procalcitonin:4,lacticidven:4)  ) Recent Results (from the past 240 hour(s))  Blood Culture (routine x 2)     Status: None (Preliminary result)   Collection Time: 01/06/17  9:30 AM  Result Value Ref Range Status   Specimen Description BLOOD RIGHT WRIST  Final   Special Requests   Final    BOTTLES DRAWN AEROBIC AND ANAEROBIC Blood Culture adequate volume   Culture NO GROWTH < 24 HOURS  Final   Report Status PENDING  Incomplete  Blood Culture (routine x 2)     Status: None (Preliminary result)   Collection Time: 01/06/17  9:30 AM  Result Value Ref Range Status   Specimen Description BLOOD LEFT ANTECUBITAL  Final   Special Requests   Final    BOTTLES DRAWN AEROBIC AND ANAEROBIC Blood Culture adequate volume   Culture NO GROWTH < 24 HOURS  Final   Report Status PENDING  Incomplete  Urine Culture     Status: Abnormal   Collection Time: 01/06/17 11:17 AM  Result Value Ref Range Status   Specimen Description URINE, RANDOM  Final   Special Requests NONE  Final   Culture <10,000 COLONIES/mL INSIGNIFICANT GROWTH (A)  Final   Report Status 01/07/2017 FINAL  Final         Radiology Studies: Dg  Chest 2 View  Result Date: 01/06/2017 CLINICAL DATA:  Shortness of breath since Monday EXAM: CHEST  2 VIEW COMPARISON:  12/07/2016 FINDINGS: EKG leads create artifact over the chest. Normal heart size and mediastinal contours. No acute infiltrate or edema. No effusion or pneumothorax. No acute osseous findings. IMPRESSION: No evidence of active disease. Electronically Signed   By: Monte Fantasia M.D.   On: 01/06/2017 09:22   Ct Angio Chest Pe W And/or Wo Contrast  Result Date: 01/06/2017 CLINICAL DATA:  Dizziness and shortness of breath  for 3 days. EXAM: CT ANGIOGRAPHY CHEST WITH CONTRAST TECHNIQUE: Multidetector CT imaging of the chest was performed using the standard protocol during bolus administration of intravenous contrast. Multiplanar CT image reconstructions and MIPs were obtained to evaluate the vascular anatomy. CONTRAST:  100 cc Isovue 370 intravenous COMPARISON:  None available FINDINGS: Cardiovascular: Satisfactory opacification of the pulmonary arteries to the segmental level. Intermittent motion and streak artifact with no evidence of pulmonary embolism. Normal heart size. No pericardial effusion. Mediastinum/Nodes: Negative for mass or adenopathy Lungs/Pleura: There is no edema, consolidation, effusion, or pneumothorax. Upper Abdomen: Negative Musculoskeletal: No acute or aggressive finding Review of the MIP images confirms the above findings. IMPRESSION: No evidence of pulmonary embolism.  No explanation for symptoms. Electronically Signed   By: Monte Fantasia M.D.   On: 01/06/2017 11:13        Scheduled Meds: . FLUoxetine  30 mg Oral Daily  . propranolol  10 mg Oral BID   Continuous Infusions: . sodium chloride 125 mL/hr at 01/07/17 0600  . piperacillin-tazobactam (ZOSYN)  IV    . sodium chloride Stopped (01/06/17 1027)  . vancomycin    . [START ON 01/08/2017] vancomycin       LOS: 1 day    Time spent: [place], MD [facility] ([facility]   If  7PM-7AM, please contact night-coverage [email or web address] Password [email or web address] 01/07/2017, 2:19 PM

## 2017-01-07 NOTE — Progress Notes (Signed)
[facility]  [phone] [phone]     ID: Tashyra Adduci DOB: 1966/03/16  MR#: [id number]  FYB#:[id number]  Patient Care Team: Ma Hillock, DO as PCP - General (Family Medicine) Braden, Danny Lawless, DO as Referring Physician Servando Salina, MD as Consulting Physician (Obstetrics and Gynecology) Chauncey Cruel, MD OTHER MD:  CHIEF COMPLAINT: thrombocytopenia, sepsis  CURRENT TREATMENT: antibiotics   HISTORY PF PRESENT ILLNESS: Olanda tells me about a mnth ago she had an episode of bronchitis. She is not a smoker. This was treated with azuthromycin. It "never quite cleared," though she was not particularly SOB. More recently she developed frequency and hematuria and her PCP obtained a urine culture which grew e coli, gent and tobra resistant; it was sensitive to pip/tazo which the patient currently is receiving. On 07/16 she experienced sudden SOB and fatigue while walking her dog-- "I didn't think I was going to make it home." On 07/17 she developed a fever which she treated with tylenol but as the symptoms worsened she came to the ED 07/18 where a CT angio showed no PE or infiltrates--in fact no obvious cause for the SOB.  She was admitted with fever and hypotension and though initially she says she felt better today she is still having rigors.   Repeat UCX have been negative; multiple blood cultures are pending as are multiple labs including HIV and RMSF.   The patient's subsequent history is as detailed below.  INTERVAL HISTORY: I met with the patient in her hospital room 01/07/2017; her husband Elta Guadeloupe and son Marcello Moores were also present  REVIEW OF SYSTEMS: She has no dysuria or frequency at present. No BMs x 2d. Still has shaking chills, 2-3 times today. No h/a, visual changes, N/V. Cough "much better"; no phlegm at any point. Denies rash, unexplained weight loss, or adenopathy.  PAST MEDICAL HISTORY: Past Medical History:  Diagnosis Date   . Anxiety   . Depression    Mild n/ infertility  . History of miscarriage   . Infertility   . Migraines   . Sepsis ([place]) 12/2016  . Urinary tract bacterial infections    2x  . Urine incontinence    Mild    PAST SURGICAL HISTORY: Past Surgical History:  Procedure Laterality Date  . APPENDECTOMY  1980  . BREAST SURGERY  1986   Reduction  . CESAREAN SECTION  2003  . DILATION AND CURETTAGE OF UTERUS  1999-2002  . HYSTEROSCOPY  2001  . LAPAROSCOPY  2001  . TUBAL LIGATION  2003    FAMILY HISTORY Family History  Problem Relation Age of Onset  . Cancer Father        prostate  . Heart disease Father 19       MI, premature  . Stroke Father        Mind  . Stroke Maternal Grandfather   . Anemia Mother   . Autism spectrum disorder Daughter   . Stroke Maternal Grandmother     GYNECOLOGIC HISTORY:  No LMP recorded. Patient is perimenopausal.   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 Her husband Elta Guadeloupe is an Chief Financial Officer.      HEALTH MAINTENANCE: Social History  Substance Use Topics  . Smoking status: Never Smoker  . Smokeless tobacco: Never Used  . Alcohol use 2.4 - 3.0 oz/week    4 - 5 Glasses of wine per week      Allergies  Allergen Reactions  . Heparin     Do  not give per MD  . Sulfa Antibiotics Hives    Current Facility-Administered Medications  Medication Dose Route Frequency Provider Last Rate Last Dose  . 0.9 %  sodium chloride infusion   Intravenous Continuous Waldemar Dickens, MD 125 mL/hr at 01/07/17 1700    . 0.9 %  sodium chloride infusion  250 mL Intravenous PRN Nita Sells, MD      . acetaminophen (TYLENOL) tablet 650 mg  650 mg Oral Q6H PRN Waldemar Dickens, MD   650 mg at 01/07/17 1515   Or  . acetaminophen (TYLENOL) suppository 650 mg  650 mg Rectal Q6H PRN Waldemar Dickens, MD      . doxycycline (VIBRAMYCIN) 100 mg in dextrose 5 % 250 mL IVPB  100 mg Intravenous Q12H Nita Sells, MD 125 mL/hr at 01/07/17 2002  100 mg at 01/07/17 2002  . FLUoxetine (PROZAC) capsule 30 mg  30 mg Oral Daily Waldemar Dickens, MD   30 mg at 01/07/17 0829  . ibuprofen (ADVIL,MOTRIN) tablet 800 mg  800 mg Oral Q6H PRN Waldemar Dickens, MD   800 mg at 01/07/17 1853  . ketorolac (TORADOL) 15 MG/ML injection 15 mg  15 mg Intravenous Q6H Nita Sells, MD   15 mg at 01/07/17 1728  . LORazepam (ATIVAN) injection 0.5-1 mg  0.5-1 mg Intravenous Q6H PRN Waldemar Dickens, MD   1 mg at 01/06/17 2208  . ondansetron (ZOFRAN) tablet 4 mg  4 mg Oral Q6H PRN Waldemar Dickens, MD       Or  . ondansetron [facility]) injection 4 mg  4 mg Intravenous Q6H PRN Waldemar Dickens, MD      . phenazopyridine (PYRIDIUM) tablet 100 mg  100 mg Oral TID WC PRN Waldemar Dickens, MD      . piperacillin-tazobactam (ZOSYN) IVPB 3.375 g  3.375 g Intravenous Q8H Jaquita Folds, RPH   Stopped at 01/07/17 1915  . sodium chloride 0.9 % bolus 1,000 mL  1,000 mL Intravenous Once Margarita Mail, PA-C   Stopped at 01/06/17 1027  . sodium chloride flush (NS) 0.9 % injection 3 mL  3 mL Intravenous Q12H Samtani, Jai-Gurmukh, MD      . sodium chloride flush (NS) 0.9 % injection 3 mL  3 mL Intravenous PRN Nita Sells, MD        OBJECTIVE: middle aged White woman examined in bed  Vitals:   01/07/17 1541 01/07/17 1735  BP: (!) 96/48   Pulse: 94   Resp: 16   Temp: (!) 101.8 F (38.8 C) (!) 101.3 F (38.5 C)     Body mass index is 25.12 kg/m.  Wt Readings from Last 3 Encounters:  01/06/17 158 lb (71.7 kg)  12/25/16 162 lb (73.5 kg)  12/07/16 167 lb (75.8 kg)    Ocular: Sclerae unicteric, pupils round and equal Ear-nose-throat: Oropharynx clear, slightly dry Lungs no rales or rhonchi--auscultated anterolaterally Heart regular rate and rhythm Abd soft, nontender, positive bowel sounds Neuro: non-focal, well-oriented, appropriate affect  LAB RESULTS:  CMP     Component Value Date/Time   NA 139 01/07/2017 0119   NA 139 01/22/2010   K 3.7  01/07/2017 0119   K 4.2 01/22/2010   CL 113 (H) 01/07/2017 0119   CL 105 01/22/2010   CO2 20 (L) 01/07/2017 0119   GLUCOSE 103 (H) 01/07/2017 0119   BUN 6 01/07/2017 0119   BUN 15 01/22/2010   CREATININE 0.80 01/07/2017 0119   CREATININE 0.83 07/31/2015  1551   CALCIUM 7.8 (L) 01/07/2017 0119   CALCIUM 9.0 01/22/2010   PROT 6.5 01/06/2017 0838   PROT 7.2 01/22/2010   ALBUMIN 3.9 01/06/2017 0838   ALBUMIN 4.2 01/22/2010   AST 38 01/06/2017 0838   AST 16 01/22/2010   ALT 22 01/06/2017 0838   ALKPHOS 86 01/06/2017 0838   ALKPHOS 98 01/22/2010   BILITOT 0.8 01/06/2017 0838   BILITOT 0.5 01/22/2010   GFRNONAA >60 01/07/2017 0119   GFRNONAA 79 01/22/2010   GFRAA >60 01/07/2017 0119   GFRAA 92 01/22/2010    No results found for: TOTALPROTELP, ALBUMINELP, A1GS, A2GS, BETS, BETA2SER, GAMS, MSPIKE, SPEI  No results found for: KPAFRELGTCHN, LAMBDASER, KAPLAMBRATIO  Lab Results  Component Value Date   WBC 1.2 (LL) 01/07/2017   NEUTROABS 0.9 (L) 01/07/2017   HGB 10.6 (L) 01/07/2017   HCT 31.9 (L) 01/07/2017   MCV 97.3 01/07/2017   PLT 58 (L) 01/07/2017   PLT 60 (L) 01/07/2017    _0 @  No results found for: LABCA2  No components found for: [id number]   Recent Labs Lab 01/07/17 1712  INR 1.19    Urinalysis    Component Value Date/Time   COLORURINE YELLOW 01/06/2017 1114   APPEARANCEUR HAZY (A) 01/06/2017 1114   LABSPEC 1.014 01/06/2017 1114   PHURINE 6.0 01/06/2017 1114   GLUCOSEU NEGATIVE 01/06/2017 1114   HGBUR SMALL (A) 01/06/2017 1114   BILIRUBINUR NEGATIVE 01/06/2017 1114   BILIRUBINUR Small 12/25/2016 1440   KETONESUR 20 (A) 01/06/2017 1114   PROTEINUR NEGATIVE 01/06/2017 1114   UROBILINOGEN 0.2 12/25/2016 1440   NITRITE NEGATIVE 01/06/2017 1114   LEUKOCYTESUR LARGE (A) 01/06/2017 1114     STUDIES: Dg Chest 2 View  Result Date: 01/06/2017 CLINICAL DATA:  Shortness of breath since Monday EXAM: CHEST  2 VIEW COMPARISON:  12/07/2016  FINDINGS: EKG leads create artifact over the chest. Normal heart size and mediastinal contours. No acute infiltrate or edema. No effusion or pneumothorax. No acute osseous findings. IMPRESSION: No evidence of active disease. Electronically Signed   By: Monte Fantasia M.D.   On: 01/06/2017 09:22   Ct Head Wo Contrast  Result Date: 01/07/2017 CLINICAL DATA:  Fever of unknown origin.  Headaches. EXAM: CT HEAD WITHOUT CONTRAST TECHNIQUE: Contiguous axial images were obtained from the base of the skull through the vertex without intravenous contrast. COMPARISON:  None. FINDINGS: Brain: Ventricles are normal in size and configuration. All areas of the brain demonstrate normal gray-white matter attenuation. There is no mass, hemorrhage, edema or other evidence of acute parenchymal abnormality. No extra-axial hemorrhage. Vascular: No hyperdense vessel or unexpected calcification. Skull: Normal. Negative for fracture or focal lesion. Sinuses/Orbits: No acute finding. Other: None. IMPRESSION: Negative head CT.  No intracranial mass, hemorrhage or edema. Electronically Signed   By: Franki Cabot M.D.   On: 01/07/2017 18:06   Ct Angio Chest Pe W And/or Wo Contrast  Result Date: 01/06/2017 CLINICAL DATA:  Dizziness and shortness of breath for 3 days. EXAM: CT ANGIOGRAPHY CHEST WITH CONTRAST TECHNIQUE: Multidetector CT imaging of the chest was performed using the standard protocol during bolus administration of intravenous contrast. Multiplanar CT image reconstructions and MIPs were obtained to evaluate the vascular anatomy. CONTRAST:  100 cc Isovue 370 intravenous COMPARISON:  None available FINDINGS: Cardiovascular: Satisfactory opacification of the pulmonary arteries to the segmental level. Intermittent motion and streak artifact with no evidence of pulmonary embolism. Normal heart size. No pericardial effusion. Mediastinum/Nodes: Negative for mass or adenopathy Lungs/Pleura: There  is no edema, consolidation,  effusion, or pneumothorax. Upper Abdomen: Negative Musculoskeletal: No acute or aggressive finding Review of the MIP images confirms the above findings. IMPRESSION: No evidence of pulmonary embolism.  No explanation for symptoms. Electronically Signed   By: Monte Fantasia M.D.   On: 01/06/2017 11:13    ASSESSMENT: 51 y.o. [place] woman with a recent history of e coli UTI, admitted 01/06/2017 with fever, rigors, and SOB, but with now negative UCX and entirely unremarkable CT angio.  PLAN: The history of persistent fever and recurrent rigors suggests an abscess or similar source--if so blood cultures should turn positive, but she had been on ABX PTA and this may delay results. It may be worthwhile to obtain a CT abd/pelvis with this in mind.  The platelet count was normal a month ago when she was seen for "bronchitis." It was down to 117 on admission--in other words the process had started before admission. This means it is not likely to be due to her current antibiotics or to HIT, though obtaining a HIT screen and avoiding heparin was prudent.   The setting is very suggestive of DIC, but the coags were unremarkable and there were no schistocytes. This makes DIC and TTP unlikely. The fact that the WBC and Hb are also down suggests a general marrow suppression, not ITP.  In short I think we are seeing moderate and hopefully temporary marrow suppression from some cause which could be the cephalexin she received for her UTI but more likely is related to the cause of her continuing fever and rigors. I am adding a drug screen to AM labs and if positive would consider echo for endocarditis.  I am encouraged that the platelets do not seem to be continuing to decrease.   Do not believe a bone marrow biopsy would be informative.  Appreciate consulting with you on this challenging case. Chauncey Cruel, MD   01/07/2017 8:43 PM Medical Oncology and Hematology [facility] [place], [place] [address] Tel. [phone]    Fax. [phone]

## 2017-01-07 NOTE — Progress Notes (Signed)
Spiking temp 102 despite vanc/zosyn plt now 60, down from baseline near 200's, mild neutropenia, ANC now 1400 ddx atypical infection /DIC?/serum sickness?  D/w patient-has a new puppy who had some ticks--she recalls tick bites in April this yr-ehrlichia/?RMSF She has HA but doesn't appear toxic at present--wills top her propanolol [takes this for tremor from prozac] D/w ID dr. Lolita Rieger vanc, add Doxy-getting titers for ehrlichia and RMSF-will see if no improvement in am D/w oncologist dr Macky Lower CT head--add HIT panel-he will see in consult later today--Much appreciated input in advance   Brenda Griffes, MD [facility] (P) [phone]

## 2017-01-07 NOTE — Progress Notes (Signed)
Pharmacy Antibiotic Note  Brenda Foster is a 51 y.o. female admitted on 01/06/2017 with sepsis and aspiration pneumonia.  Pharmacy has been consulted for Vancomycin and Zosyn dosing.  Pt was initially started on these 7/18, then change to Ceftriaxone, and now changing back to Vancomycin and Zosyn.  Plan: Resume Vancomycin 1000mg  IV q12 Resume Zosyn 3.375g IV q8, infuse over 4hr F/U cx results.  Height: 5' 6.5" (168.9 cm) Weight: 158 lb (71.7 kg) IBW/kg (Calculated) : 60.45  Temp (24hrs), Avg:99.4 F (37.4 C), Min:98.7 F (37.1 C), Max:100.4 F (38 C)   Recent Labs Lab 01/06/17 0838 01/06/17 0904 01/06/17 1158 01/06/17 1947 01/07/17 0119  WBC 2.2*  --   --   --  2.1*  CREATININE 0.89  --   --   --  0.80  LATICACIDVEN  --  3.24* 1.87 2.3* 1.1    Estimated Creatinine Clearance: 78.6 mL/min (by C-G formula based on SCr of 0.8 mg/dL).    Allergies  Allergen Reactions  . Sulfa Antibiotics Hives    7/18 Zosyn >> 7/18, resumed 7/19 7/18 vancomycin >> 7/18, resumed 7/19 7/18 Ceftriaxone >> 7/19  7/18 BCx: ntd 7/18 UCx: < 10K insig 7/18 Influenza:   Thank you for allowing pharmacy to be a part of this patient's care.   Lewie Chamber., PharmD Clinical Pharmacist [facility]

## 2017-01-08 ENCOUNTER — Inpatient Hospital Stay (HOSPITAL_COMMUNITY): Payer: BC Managed Care – PPO

## 2017-01-08 LAB — CBC WITH DIFFERENTIAL/PLATELET
BASOS ABS: 0 10*3/uL (ref 0.0–0.1)
BASOS PCT: 0 %
Basophils Absolute: 0 10*3/uL (ref 0.0–0.1)
Basophils Relative: 3 %
EOS ABS: 0 10*3/uL (ref 0.0–0.7)
EOS PCT: 0 %
Eosinophils Absolute: 0 10*3/uL (ref 0.0–0.7)
Eosinophils Relative: 0 %
HCT: 27.5 % — ABNORMAL LOW (ref 36.0–46.0)
HCT: 31 % — ABNORMAL LOW (ref 36.0–46.0)
Hemoglobin: 9.6 g/dL — ABNORMAL LOW (ref 12.0–15.0)
Hemoglobin: 10.5 g/dL — ABNORMAL LOW (ref 12.0–15.0)
LYMPHS ABS: 0.1 10*3/uL — AB (ref 0.7–4.0)
LYMPHS ABS: 0.5 10*3/uL — AB (ref 0.7–4.0)
LYMPHS PCT: 44 %
Lymphocytes Relative: 32 %
MCH: 33.9 pg (ref 26.0–34.0)
MCH: 32.5 pg (ref 26.0–34.0)
MCHC: 34.9 g/dL (ref 30.0–36.0)
MCHC: 33.9 g/dL (ref 30.0–36.0)
MCV: 97.2 fL (ref 78.0–100.0)
MCV: 96 fL (ref 78.0–100.0)
MONO ABS: 0 10*3/uL — AB (ref 0.1–1.0)
Monocytes Absolute: 0.1 10*3/uL (ref 0.1–1.0)
Monocytes Relative: 0 %
Monocytes Relative: 15 %
NEUTROS ABS: 0.3 10*3/uL — AB (ref 1.7–7.7)
NEUTROS PCT: 68 %
Neutro Abs: 0.1 10*3/uL — ABNORMAL LOW (ref 1.7–7.7)
Neutrophils Relative %: 38 %
Platelets: 36 10*3/uL — ABNORMAL LOW (ref 150–400)
RBC: 2.83 MIL/uL — ABNORMAL LOW (ref 3.87–5.11)
RBC: 3.23 MIL/uL — AB (ref 3.87–5.11)
RDW: 12.2 % (ref 11.5–15.5)
RDW: 12 % (ref 11.5–15.5)
WBC: 0.2 10*3/uL — CL (ref 4.0–10.5)
WBC: 0.9 10*3/uL — AB (ref 4.0–10.5)

## 2017-01-08 LAB — HIV ANTIBODY (ROUTINE TESTING W REFLEX): HIV Screen 4th Generation wRfx: NONREACTIVE

## 2017-01-08 LAB — COMPREHENSIVE METABOLIC PANEL
ALT: 172 U/L — AB (ref 14–54)
AST: 261 U/L — ABNORMAL HIGH (ref 15–41)
Albumin: 2.6 g/dL — ABNORMAL LOW (ref 3.5–5.0)
Alkaline Phosphatase: 166 U/L — ABNORMAL HIGH (ref 38–126)
Anion gap: 8 (ref 5–15)
BUN: 5 mg/dL — ABNORMAL LOW (ref 6–20)
CALCIUM: 7.7 mg/dL — AB (ref 8.9–10.3)
CHLORIDE: 113 mmol/L — AB (ref 101–111)
CO2: 17 mmol/L — ABNORMAL LOW (ref 22–32)
CREATININE: 0.73 mg/dL (ref 0.44–1.00)
Glucose, Bld: 127 mg/dL — ABNORMAL HIGH (ref 65–99)
Potassium: 3.7 mmol/L (ref 3.5–5.1)
Sodium: 138 mmol/L (ref 135–145)
TOTAL PROTEIN: 4.5 g/dL — AB (ref 6.5–8.1)
Total Bilirubin: 0.7 mg/dL (ref 0.3–1.2)

## 2017-01-08 LAB — PATHOLOGIST SMEAR REVIEW

## 2017-01-08 LAB — SEDIMENTATION RATE: Sed Rate: 6 mm/hr (ref 0–22)

## 2017-01-08 LAB — HEPARIN INDUCED PLATELET AB (HIT ANTIBODY): Heparin Induced Plt Ab: 0.292 OD (ref 0.000–0.400)

## 2017-01-08 LAB — ABO/RH: ABO/RH(D): O POS

## 2017-01-08 MED ORDER — ALUM & MAG HYDROXIDE-SIMETH 200-200-20 MG/5ML PO SUSP
30.0000 mL | Freq: Four times a day (QID) | ORAL | Status: DC | PRN
  Administered 2017-01-08: 30 mL via ORAL
  Filled 2017-01-08: qty 30

## 2017-01-08 MED ORDER — TRAMADOL HCL 50 MG PO TABS: 50.0000 mg | ORAL_TABLET | Freq: Four times a day (QID) | ORAL | Status: DC | PRN

## 2017-01-08 MED ORDER — POLYVINYL ALCOHOL 1.4 % OP SOLN
1.0000 [drp] | OPHTHALMIC | Status: DC | PRN
  Filled 2017-01-08 (×2): qty 15

## 2017-01-08 MED ORDER — IOPAMIDOL (ISOVUE-300) INJECTION 61%
INTRAVENOUS | Status: AC
  Administered 2017-01-08: 75 mL via INTRAVENOUS
  Filled 2017-01-08: qty 100

## 2017-01-08 MED ORDER — SODIUM CHLORIDE 0.9 % IV SOLN
Freq: Once | INTRAVENOUS | Status: AC
  Administered 2017-01-08: 08:00:00 via INTRAVENOUS

## 2017-01-08 NOTE — Progress Notes (Addendum)
PROGRESS NOTE    Brenda Foster  PPJ:[id number] DOB: 12-Sep-1965 DOA: 01/06/2017 PCP: Ma Hillock, DO  Outpatient Specialists:     Brief Narrative:  51 y/o ? Bipolar migraines Recent Rx for UTI-Keflex for e coli completed Rx Has had also a prolonged rhinitis Recent travel [place] Has had HA and weakness as main presenting c/o  Admitted with presumed UTI cxr neg No meningismus  Progressed to have myalgias as well as spiking temp despite rocephin 7/20--broadened back abx to Vanc Zosyn Consulted ID, Consulted Heme Work-up underway for FUO    Assessment & Plan:   Active Problems:   Situational anxiety   Fatigue   Sepsis (HCC)   Pyelonephritis   Hyperglycemia   Hypokalemia   Fever of unknown origin Etiology unknown  Discontinued vancomycin-started doxycycline under direction of Dr. Alfred Levins ID  Keep Zosyn for now   Idiopathicbone marrow suppression-? Keflex versus serum sickness Acute thrombocytopenia platelets less than 5 down from 60 7/19 Risk of febrile neutropenia-ANC now 100-started neutropenic precautions Elevated transaminases, elevated alkaline phosphatase  ? Drug abuse--drug screen is pending  Await ANA, hepatitis panel, HIV, heavy metal screen, autoimmune workup, Ehlichia, RMSF, HIV, ESR  Have had a long discussion with the patient as well as her husband at the bedside and will update later if we get  back any lab work that's suggestive of a cause  Greatly appreciated hematology oncology expertise-might need to involve infectious disease in patient's care  CT scan abdomen pelvis and process  ? Interstitial nephritis  Unclear this is an actual diagnosis she has   Hypertension  We have held propanolol 10 twice a day  Bipolar  Continue Prozac 30, Ativan 0.5-1 mg every 6 when necessary anxiety  DC Lovenox as mild thrombocytopenia SCDs Discussed with husband inpatient Inpatient    Consultants:   None yet  Procedures:     Antimicrobials:     Vancomycin + Zosyn 7/18   Doxycycline 7/20   Subjective:  Patient feels tired washed up and worn out and feels more depleted than yesterday She is not having any nausea, any bleeding, any chills but does feel weak She was hardly able to get to the restroom Husband is at the bedside She has not eaten much this morning   Objective: Vitals:   01/08/17 0039 01/08/17 0542 01/08/17 0754 01/08/17 0815  BP:  (!) 95/58 108/66 110/75  Pulse:  81 85 86  Resp:  '19 19 16  ' Temp: 98.9 F (37.2 C) 99.3 F (37.4 C) 99.6 F (37.6 C) 98.3 F (36.8 C)  TempSrc: Oral Oral Oral Oral  SpO2:  98% 98% 98%  Weight:      Height:        Intake/Output Summary (Last 24 hours) at 01/08/17 0936 Last data filed at 01/08/17 5809  Gross per 24 hour  Intake             2450 ml  Output                0 ml  Net             2450 ml   Filed Weights   01/06/17 0835  Weight: 71.7 kg (158 lb)    Examination:  Extremely pale, no exudate rest no pallor Mucosa is moist Chest is clear without any rails or rhonchi I do not appreciate any murmur regular rate rhythm Slight tenderness right upper quadrant and epigastrium, no radiation, no rebound, no guarding Lower extremities are soft nontender  External ocular movements are intact neurologically 5/5 power however is very weak   Data Reviewed: I have personally reviewed following labs and imaging studies  CBC:  Recent Labs Lab 01/06/17 0838 01/07/17 0119 01/07/17 1435 01/07/17 1712 01/08/17 0430  WBC 2.2* 2.1* QUESTIONABLE RESULTS, RECOMMEND RECOLLECT TO VERIFY 1.2* 0.2*  NEUTROABS 1.4* 1.4* QUESTIONABLE RESULTS, RECOMMEND RECOLLECT TO VERIFY 0.9* 0.1*  HGB 13.5 11.1* 11.1* 10.6* 9.6*  HCT 38.3 33.4* 31.8* 31.9* 27.5*  MCV 95.8 97.4 97.0 97.3 97.2  PLT 117* 72* 60* 58*  60* <5*   Basic Metabolic Panel:  Recent Labs Lab 01/06/17 0838 01/07/17 0119 01/08/17 0430  NA 134* 139 138  K 3.4* 3.7 3.7  CL 107 113* 113*  CO2 16* 20* 17*   GLUCOSE 128* 103* 127*  BUN 8 6 5*  CREATININE 0.89 0.80 0.73  CALCIUM 9.1 7.8* 7.7*   GFR: Estimated Creatinine Clearance: 78.6 mL/min (by C-G formula based on SCr of 0.73 mg/dL). Liver Function Tests:  Recent Labs Lab 01/06/17 0838 01/08/17 0430  AST 38 261*  ALT 22 172*  ALKPHOS 86 166*  BILITOT 0.8 0.7  PROT 6.5 4.5*  ALBUMIN 3.9 2.6*   No results for input(s): LIPASE, AMYLASE in the last 168 hours. No results for input(s): AMMONIA in the last 168 hours. Coagulation Profile:  Recent Labs Lab 01/07/17 0821 01/07/17 1712  INR 1.18 1.19   Cardiac Enzymes: No results for input(s): CKTOTAL, CKMB, CKMBINDEX, TROPONINI in the last 168 hours. BNP (last 3 results) No results for input(s): PROBNP in the last 8760 hours. HbA1C: No results for input(s): HGBA1C in the last 72 hours. CBG:  Recent Labs Lab 01/06/17 0934  GLUCAP 97   Lipid Profile: No results for input(s): CHOL, HDL, LDLCALC, TRIG, CHOLHDL, LDLDIRECT in the last 72 hours. Thyroid Function Tests: No results for input(s): TSH, T4TOTAL, FREET4, T3FREE, THYROIDAB in the last 72 hours. Anemia Panel: No results for input(s): VITAMINB12, FOLATE, FERRITIN, TIBC, IRON, RETICCTPCT in the last 72 hours. Urine analysis:    Component Value Date/Time   COLORURINE YELLOW 01/06/2017 1114   APPEARANCEUR HAZY (A) 01/06/2017 1114   LABSPEC 1.014 01/06/2017 1114   PHURINE 6.0 01/06/2017 1114   GLUCOSEU NEGATIVE 01/06/2017 1114   HGBUR SMALL (A) 01/06/2017 1114   BILIRUBINUR NEGATIVE 01/06/2017 1114   BILIRUBINUR Small 12/25/2016 1440   KETONESUR 20 (A) 01/06/2017 1114   PROTEINUR NEGATIVE 01/06/2017 1114   UROBILINOGEN 0.2 12/25/2016 1440   NITRITE NEGATIVE 01/06/2017 1114   LEUKOCYTESUR LARGE (A) 01/06/2017 1114   Sepsis Labs: '@LABRCNTIP' (procalcitonin:4,lacticidven:4)  ) Recent Results (from the past 240 hour(s))  Blood Culture (routine x 2)     Status: None (Preliminary result)   Collection Time: 01/06/17   9:30 AM  Result Value Ref Range Status   Specimen Description BLOOD RIGHT WRIST  Final   Special Requests   Final    BOTTLES DRAWN AEROBIC AND ANAEROBIC Blood Culture adequate volume   Culture NO GROWTH 2 DAYS  Final   Report Status PENDING  Incomplete  Blood Culture (routine x 2)     Status: None (Preliminary result)   Collection Time: 01/06/17  9:30 AM  Result Value Ref Range Status   Specimen Description BLOOD LEFT ANTECUBITAL  Final   Special Requests   Final    BOTTLES DRAWN AEROBIC AND ANAEROBIC Blood Culture adequate volume   Culture NO GROWTH 2 DAYS  Final   Report Status PENDING  Incomplete  Urine Culture  Status: Abnormal   Collection Time: 01/06/17 11:17 AM  Result Value Ref Range Status   Specimen Description URINE, RANDOM  Final   Special Requests NONE  Final   Culture <10,000 COLONIES/mL INSIGNIFICANT GROWTH (A)  Final   Report Status 01/07/2017 FINAL  Final         Radiology Studies: Ct Head Wo Contrast  Result Date: 01/07/2017 CLINICAL DATA:  Fever of unknown origin.  Headaches. EXAM: CT HEAD WITHOUT CONTRAST TECHNIQUE: Contiguous axial images were obtained from the base of the skull through the vertex without intravenous contrast. COMPARISON:  None. FINDINGS: Brain: Ventricles are normal in size and configuration. All areas of the brain demonstrate normal gray-white matter attenuation. There is no mass, hemorrhage, edema or other evidence of acute parenchymal abnormality. No extra-axial hemorrhage. Vascular: No hyperdense vessel or unexpected calcification. Skull: Normal. Negative for fracture or focal lesion. Sinuses/Orbits: No acute finding. Other: None. IMPRESSION: Negative head CT.  No intracranial mass, hemorrhage or edema. Electronically Signed   By: Franki Cabot M.D.   On: 01/07/2017 18:06   Ct Angio Chest Pe W And/or Wo Contrast  Result Date: 01/06/2017 CLINICAL DATA:  Dizziness and shortness of breath for 3 days. EXAM: CT ANGIOGRAPHY CHEST WITH  CONTRAST TECHNIQUE: Multidetector CT imaging of the chest was performed using the standard protocol during bolus administration of intravenous contrast. Multiplanar CT image reconstructions and MIPs were obtained to evaluate the vascular anatomy. CONTRAST:  100 cc Isovue 370 intravenous COMPARISON:  None available FINDINGS: Cardiovascular: Satisfactory opacification of the pulmonary arteries to the segmental level. Intermittent motion and streak artifact with no evidence of pulmonary embolism. Normal heart size. No pericardial effusion. Mediastinum/Nodes: Negative for mass or adenopathy Lungs/Pleura: There is no edema, consolidation, effusion, or pneumothorax. Upper Abdomen: Negative Musculoskeletal: No acute or aggressive finding Review of the MIP images confirms the above findings. IMPRESSION: No evidence of pulmonary embolism.  No explanation for symptoms. Electronically Signed   By: Monte Fantasia M.D.   On: 01/06/2017 11:13        Scheduled Meds: . FLUoxetine  30 mg Oral Daily  . sodium chloride flush  3 mL Intravenous Q12H   Continuous Infusions: . sodium chloride 125 mL/hr at 01/07/17 2202  . sodium chloride    . doxycycline (VIBRAMYCIN) IV Stopped (01/07/17 2202)  . piperacillin-tazobactam (ZOSYN)  IV 3.375 g (01/08/17 1157)  . sodium chloride Stopped (01/06/17 1027)     LOS: 2 days    Time spent: [place], MD [facility]   If 7PM-7AM, please contact night-coverage [email or web address] Password [facility] 01/08/2017, 9:36 AM

## 2017-01-08 NOTE — Progress Notes (Addendum)
CRITICAL VALUE ALERT  Critical Value: Platelet >5  Date & Time Notied: 01/08/2017  0630  Provider Notified: Dr. Hal Hope  Orders Received/Actions taken: No orders at this time,will consult Oncologist.

## 2017-01-08 NOTE — Progress Notes (Signed)
Am surprised by today's counts CBC    Component Value Date/Time   WBC 0.2 (LL) 01/08/2017 0430   RBC 2.83 (L) 01/08/2017 0430   HGB 9.6 (L) 01/08/2017 0430   HGB 13.8 01/22/2010   HCT 27.5 (L) 01/08/2017 0430   PLT <5 (LL) 01/08/2017 0430   MCV 97.2 01/08/2017 0430   MCH 33.9 01/08/2017 0430   MCHC 34.9 01/08/2017 0430   RDW 12.2 01/08/2017 0430   RDW 12.1 01/22/2010   LYMPHSABS 0.1 (L) 01/08/2017 0430   MONOABS 0.0 (L) 01/08/2017 0430   EOSABS 0.0 01/08/2017 0430   BASOSABS 0.0 01/08/2017 0430   Review of the blood film today (and re-review of yesterday's) shows rouleaux, no schistocytes, and no left shift (no evidence of leukemia or of DIC, TTP); confirms absence of platelets (no clumps) With LFT elevation CMP Latest Ref Rng & Units 01/08/2017 01/07/2017 01/06/2017  Glucose 65 - 99 mg/dL 127(H) 103(H) 128(H)  BUN 6 - 20 mg/dL 5(L) 6 8  Creatinine 0.44 - 1.00 mg/dL 0.73 0.80 0.89  Sodium 135 - 145 mmol/L 138 139 134(L)  Potassium 3.5 - 5.1 mmol/L 3.7 3.7 3.4(L)  Chloride 101 - 111 mmol/L 113(H) 113(H) 107  CO2 22 - 32 mmol/L 17(L) 20(L) 16(L)  Calcium 8.9 - 10.3 mg/dL 7.7(L) 7.8(L) 9.1  Total Protein 6.5 - 8.1 g/dL 4.5(L) - 6.5  Total Bilirubin 0.3 - 1.2 mg/dL 0.7 - 0.8  Alkaline Phos 38 - 126 U/L 166(H) - 86  AST 15 - 41 U/L 261(H) - 38  ALT 14 - 54 U/L 172(H) - 22   I think it would be a good idea to obtain a CT of the abd/pelvis; would also add ANA, ESR and heavy metal screen.   Would obtain repeat platelet count 2 hors after transfusion to document "bounce" (or lack of it)   I will be out of town until Tuesday; my partner Dr Lindi Adie has been alerted to this case. Please do not hesitate to contact my partners as needed

## 2017-01-08 NOTE — Consult Note (Signed)
[facility] Consult/Admission Note  Brenda Foster 1965-11-18  [id number].    Requesting MD: Dr. Verlon Au Chief Complaint/Reason for Consult: possible cholecystitis  HPI:   Pt is a 51 year old female with a history of anxiety/depression on Prozac and propranolol, and migraines who presented to the ED with complaints of fever and shortness of breath. Patient states she developed bronchitis roughly 4 weeks ago which never completely resolved although improved with azithromycin. Roughly 2 weeks ago patient was seen by PCP and treated for UTI with Keflex. 2 days prior to admission patient developed worsening fatigue and associated shortness of breath. Shortness of breath is not dependent upon activity. SOB is constant. Associated fevers as high as 103. Patient denies abdominal pain, nausea, vomiting. Patient states her last BM was roughly 6 days ago patient normally has a BM every day. Patient has never had right upper quadrant pain in the past. Patient is not currently having any abdominal pain. Patient denies travel outside of the country. Husband`` was at bedside and states the patient has been in multiple airports and they will went to [place] roughly 1 month ago. Patient was found to have thrombocytopenia and leukopenia. Since admission she has had an increase in her LFTs and alkaline phosphatase. TBili remains normal.  CT scan of abdomen: suspect gallbladder wall thickening or pericholecystic fluid.  ROS:  Review of Systems  Constitutional: Positive for chills, fever and malaise/fatigue. Negative for diaphoresis and weight loss.  Eyes: Negative for redness.  Respiratory: Positive for shortness of breath. Negative for sputum production.   Cardiovascular: Negative for chest pain.  Gastrointestinal: Positive for constipation. Negative for abdominal pain, diarrhea, nausea and vomiting.  Musculoskeletal: Negative for joint pain.  Skin: Negative for rash.  Neurological: Negative for  loss of consciousness.  All other systems reviewed and are negative.    Family History  Problem Relation Age of Onset  . Cancer Father        prostate  . Heart disease Father 21       MI, premature  . Stroke Father        Mind  . Stroke Maternal Grandfather   . Anemia Mother   . Autism spectrum disorder Daughter   . Stroke Maternal Grandmother     Past Medical History:  Diagnosis Date  . Anxiety   . Depression    Mild n/ infertility  . History of miscarriage   . Infertility   . Migraines   . Sepsis ([place]) 12/2016  . Urinary tract bacterial infections    2x  . Urine incontinence    Mild    Past Surgical History:  Procedure Laterality Date  . APPENDECTOMY  1980  . BREAST SURGERY  1986   Reduction  . CESAREAN SECTION  2003  . DILATION AND CURETTAGE OF UTERUS  1999-2002  . HYSTEROSCOPY  2001  . LAPAROSCOPY  2001  . TUBAL LIGATION  2003    Social History:  reports that she has never smoked. She has never used smokeless tobacco. She reports that she drinks about 2.4 - 3.0 oz of alcohol per week . She reports that she does not use drugs.  Allergies:  Allergies  Allergen Reactions  . Heparin     Do not give per MD  . Sulfa Antibiotics Hives    Medications Prior to Admission  Medication Sig Dispense Refill  . aspirin 325 MG tablet Take 325 mg by mouth every 6 (six) hours as needed for mild pain.    Marland Kitchen  FLUoxetine (PROZAC) 20 MG tablet Take 30 mg by mouth daily.    Marland Kitchen ibuprofen (ADVIL,MOTRIN) 200 MG tablet Take 200 mg by mouth every 6 (six) hours as needed for moderate pain.    . meloxicam (MOBIC) 15 MG tablet Take 15 mg by mouth daily.    . Multiple Vitamin (MULTIVITAMIN) tablet Take 1 tablet by mouth daily.    . Probiotic Product (PROBIOTIC PO) Take 1 tablet by mouth daily.    . propranolol (INDERAL) 10 MG tablet Take 10 mg by mouth 2 (two) times daily.     . Pseudoeph-CPM-DM-APAP (NIGHTTIME COLD/COUGH PO) Take 1 capsule by mouth at bedtime as needed (cold).    .  cephALEXin (KEFLEX) 500 MG capsule Take 1 capsule (500 mg total) by mouth 4 (four) times daily. (Patient not taking: Reported on 01/06/2017) 28 capsule 0  . phenazopyridine (PYRIDIUM) 100 MG tablet Take 1 tablet (100 mg total) by mouth 3 (three) times daily as needed for pain. (Patient not taking: Reported on 01/06/2017) 10 tablet 0    Blood pressure 124/75, pulse 86, temperature (!) 102.2 F (39 C), temperature source Oral, resp. rate 18, height 5' 6.5" (1.689 m), weight 158 lb (71.7 kg), SpO2 98 %.  Physical Exam  Constitutional: She is oriented to person, place, and time and well-developed, well-nourished, and in no distress. Vital signs are normal. No distress.  HENT:  Head: Normocephalic and atraumatic.  Nose: Nose normal.  Mouth/Throat: Oropharynx is clear and moist. No oropharyngeal exudate.  Eyes: Pupils are equal, round, and reactive to light. Conjunctivae are normal. Right eye exhibits no discharge. Left eye exhibits no discharge. No scleral icterus.  Neck: Normal range of motion. Neck supple. No tracheal deviation present. No thyromegaly present.  Cardiovascular: Normal rate, regular rhythm, normal heart sounds and intact distal pulses.  Exam reveals no gallop and no friction rub.   No murmur heard. Pulses:      Radial pulses are 2+ on the right side, and 2+ on the left side.       Dorsalis pedis pulses are 2+ on the right side, and 2+ on the left side.  Pulmonary/Chest: Effort normal and breath sounds normal. No respiratory distress. She has no decreased breath sounds. She has no wheezes. She has no rhonchi. She has no rales.  Abdominal: Soft. Normal appearance and bowel sounds are normal. She exhibits no distension and no mass. There is no tenderness. There is no rebound, no guarding and negative Murphy's sign. No hernia.  Musculoskeletal: Normal range of motion. She exhibits no edema, tenderness or deformity.  Lymphadenopathy:    She has no cervical adenopathy.  Neurological: She  is alert and oriented to person, place, and time. GCS score is 15.  Skin: Skin is warm and dry. No rash noted. She is not diaphoretic.  Psychiatric: Mood and affect normal.  Nursing note and vitals reviewed.   Results for orders placed or performed during the hospital encounter of 01/06/17 (from the past 48 hour(s))  Lactic acid, plasma     Status: Abnormal   Collection Time: 01/06/17  7:47 PM  Result Value Ref Range   Lactic Acid, Venous 2.3 (HH) 0.5 - 1.9 mmol/L    Comment: CRITICAL RESULT CALLED TO, READ BACK BY AND VERIFIED WITH: CAGUIOA N,RN 01/06/17 2051 WAYK   Basic metabolic panel     Status: Abnormal   Collection Time: 01/07/17  1:19 AM  Result Value Ref Range   Sodium 139 135 - 145 mmol/L  Potassium 3.7 3.5 - 5.1 mmol/L   Chloride 113 (H) 101 - 111 mmol/L   CO2 20 (L) 22 - 32 mmol/L   Glucose, Bld 103 (H) 65 - 99 mg/dL   BUN 6 6 - 20 mg/dL   Creatinine, Ser 0.80 0.44 - 1.00 mg/dL   Calcium 7.8 (L) 8.9 - 10.3 mg/dL   GFR calc non Af Amer >60 >60 mL/min   GFR calc Af Amer >60 >60 mL/min    Comment: (NOTE) The eGFR has been calculated using the CKD EPI equation. This calculation has not been validated in all clinical situations. eGFR's persistently <60 mL/min signify possible Chronic Kidney Disease.    Anion gap 6 5 - 15  CBC with Differential     Status: Abnormal   Collection Time: 01/07/17  1:19 AM  Result Value Ref Range   WBC 2.1 (L) 4.0 - 10.5 K/uL   RBC 3.43 (L) 3.87 - 5.11 MIL/uL   Hemoglobin 11.1 (L) 12.0 - 15.0 g/dL   HCT 33.4 (L) 36.0 - 46.0 %   MCV 97.4 78.0 - 100.0 fL   MCH 32.4 26.0 - 34.0 pg   MCHC 33.2 30.0 - 36.0 g/dL   RDW 12.0 11.5 - 15.5 %   Platelets 72 (L) 150 - 400 K/uL    Comment: REPEATED TO VERIFY SPECIMEN CHECKED FOR CLOTS CONSISTENT WITH PREVIOUS RESULT    Neutrophils Relative % 65 %   Neutro Abs 1.4 (L) 1.7 - 7.7 K/uL   Lymphocytes Relative 23 %   Lymphs Abs 0.5 (L) 0.7 - 4.0 K/uL   Monocytes Relative 12 %   Monocytes Absolute  0.3 0.1 - 1.0 K/uL   Eosinophils Relative 0 %   Eosinophils Absolute 0.0 0.0 - 0.7 K/uL   Basophils Relative 1 %   Basophils Absolute 0.0 0.0 - 0.1 K/uL  Lactic acid, plasma     Status: None   Collection Time: 01/07/17  1:19 AM  Result Value Ref Range   Lactic Acid, Venous 1.1 0.5 - 1.9 mmol/L  Protime-INR     Status: None   Collection Time: 01/07/17  8:21 AM  Result Value Ref Range   Prothrombin Time 15.1 11.4 - 15.2 seconds   INR 1.18   CBC with Differential/Platelet     Status: Abnormal   Collection Time: 01/07/17  2:35 PM  Result Value Ref Range   WBC  4.0 - 10.5 K/uL    QUESTIONABLE RESULTS, RECOMMEND RECOLLECT TO VERIFY   RBC 3.28 (L) 3.87 - 5.11 MIL/uL   Hemoglobin 11.1 (L) 12.0 - 15.0 g/dL   HCT 31.8 (L) 36.0 - 46.0 %   MCV 97.0 78.0 - 100.0 fL   MCH 33.8 26.0 - 34.0 pg   MCHC 34.9 30.0 - 36.0 g/dL   RDW 12.1 11.5 - 15.5 %   Platelets 60 (L) 150 - 400 K/uL    Comment: REPEATED TO VERIFY CONSISTENT WITH PREVIOUS RESULT    Neutrophils Relative %  %    QUESTIONABLE RESULTS, RECOMMEND RECOLLECT TO VERIFY   Neutro Abs  1.7 - 7.7 K/uL    QUESTIONABLE RESULTS, RECOMMEND RECOLLECT TO VERIFY   Band Neutrophils  %    QUESTIONABLE RESULTS, RECOMMEND RECOLLECT TO VERIFY   Lymphocytes Relative  %    QUESTIONABLE RESULTS, RECOMMEND RECOLLECT TO VERIFY   Lymphs Abs  0.7 - 4.0 K/uL    QUESTIONABLE RESULTS, RECOMMEND RECOLLECT TO VERIFY   Monocytes Relative  %    QUESTIONABLE RESULTS, RECOMMEND RECOLLECT  TO VERIFY   Monocytes Absolute  0.1 - 1.0 K/uL    QUESTIONABLE RESULTS, RECOMMEND RECOLLECT TO VERIFY   Eosinophils Relative  %    QUESTIONABLE RESULTS, RECOMMEND RECOLLECT TO VERIFY   Eosinophils Absolute  0.0 - 0.7 K/uL    QUESTIONABLE RESULTS, RECOMMEND RECOLLECT TO VERIFY   Basophils Relative  %    QUESTIONABLE RESULTS, RECOMMEND RECOLLECT TO VERIFY   Basophils Absolute  0.0 - 0.1 K/uL    QUESTIONABLE RESULTS, RECOMMEND RECOLLECT TO VERIFY   WBC Morphology       QUESTIONABLE RESULTS, RECOMMEND RECOLLECT TO VERIFY   RBC Morphology      QUESTIONABLE RESULTS, RECOMMEND RECOLLECT TO VERIFY   Smear Review      QUESTIONABLE RESULTS, RECOMMEND RECOLLECT TO VERIFY   nRBC  0 /100 WBC    QUESTIONABLE RESULTS, RECOMMEND RECOLLECT TO VERIFY   Metamyelocytes Relative  %    QUESTIONABLE RESULTS, RECOMMEND RECOLLECT TO VERIFY   Myelocytes  %    QUESTIONABLE RESULTS, RECOMMEND RECOLLECT TO VERIFY   Promyelocytes Absolute  %    QUESTIONABLE RESULTS, RECOMMEND RECOLLECT TO VERIFY   Blasts  %    QUESTIONABLE RESULTS, RECOMMEND RECOLLECT TO VERIFY  HIV antibody (Routine Testing)     Status: None   Collection Time: 01/07/17  2:35 PM  Result Value Ref Range   HIV Screen 4th Generation wRfx Non Reactive Non Reactive    Comment: (NOTE) Performed At: [facility] [place], [place] [id number] Lindon Romp MD NU:[id number]   DIC (disseminated intravasc coag) panel     Status: Abnormal   Collection Time: 01/07/17  5:12 PM  Result Value Ref Range   Prothrombin Time 15.2 11.4 - 15.2 seconds   INR 1.19    aPTT 34 24 - 36 seconds   Fibrinogen 281 210 - 475 mg/dL   D-Dimer, Quant 3.06 (H) 0.00 - 0.50 ug/mL-FEU    Comment: (NOTE) At the manufacturer cut-off of 0.50 ug/mL FEU, this assay has been documented to exclude PE with a sensitivity and negative predictive value of 97 to 99%.  At this time, this assay has not been approved by the FDA to exclude DVT/VTE. Results should be correlated with clinical presentation.    Platelets 58 (L) 150 - 400 K/uL    Comment: REPEATED TO VERIFY CONSISTENT WITH PREVIOUS RESULT    Smear Review NO SCHISTOCYTES SEEN   Heparin induced platelet Ab (HIT antibody)     Status: None   Collection Time: 01/07/17  5:12 PM  Result Value Ref Range   Heparin Induced Plt Ab 0.292 0.000 - 0.400 OD    Comment: (NOTE) Performed At: [facility] [place], [place] [id number] Lindon Romp MD  LO:[id number]   CBC with Differential/Platelet     Status: Abnormal   Collection Time: 01/07/17  5:12 PM  Result Value Ref Range   WBC 1.2 (LL) 4.0 - 10.5 K/uL    Comment: WHITE COUNT CONFIRMED ON SMEAR REPEATED TO VERIFY CRITICAL RESULT CALLED TO, READ BACK BY AND VERIFIED WITH: RN H STONE AT [phone] MARTINB    RBC 3.28 (L) 3.87 - 5.11 MIL/uL   Hemoglobin 10.6 (L) 12.0 - 15.0 g/dL   HCT 31.9 (L) 36.0 - 46.0 %   MCV 97.3 78.0 - 100.0 fL   MCH 32.3 26.0 - 34.0 pg   MCHC 33.2 30.0 - 36.0 g/dL   RDW 12.0 11.5 - 15.5 %   Platelets 60 (L) 150 -  400 K/uL    Comment: REPEATED TO VERIFY CONSISTENT WITH PREVIOUS RESULT    Neutrophils Relative % 72 %   Lymphocytes Relative 23 %   Monocytes Relative 4 %   Eosinophils Relative 0 %   Basophils Relative 1 %   Neutro Abs 0.9 (L) 1.7 - 7.7 K/uL   Lymphs Abs 0.3 (L) 0.7 - 4.0 K/uL   Monocytes Absolute 0.0 (L) 0.1 - 1.0 K/uL   Eosinophils Absolute 0.0 0.0 - 0.7 K/uL   Basophils Absolute 0.0 0.0 - 0.1 K/uL   WBC Morphology INCREASED BANDS (>20% BANDS)   CBC with Differential/Platelet     Status: Abnormal   Collection Time: 01/08/17  4:30 AM  Result Value Ref Range   WBC 0.2 (LL) 4.0 - 10.5 K/uL    Comment: REPEATED TO VERIFY CRITICAL VALUE NOTED.  VALUE IS CONSISTENT WITH PREVIOUSLY REPORTED AND CALLED VALUE.    RBC 2.83 (L) 3.87 - 5.11 MIL/uL   Hemoglobin 9.6 (L) 12.0 - 15.0 g/dL   HCT 27.5 (L) 36.0 - 46.0 %   MCV 97.2 78.0 - 100.0 fL   MCH 33.9 26.0 - 34.0 pg   MCHC 34.9 30.0 - 36.0 g/dL   RDW 12.2 11.5 - 15.5 %   Platelets <5 (LL) 150 - 400 K/uL    Comment: REPEATED TO VERIFY CRITICAL RESULT CALLED TO, READ BACK BY AND VERIFIED WITH: Derrel Nip [id number] [facility]    Neutrophils Relative % 68 %   Lymphocytes Relative 32 %   Monocytes Relative 0 %   Eosinophils Relative 0 %   Basophils Relative 0 %   Neutro Abs 0.1 (L) 1.7 - 7.7 K/uL   Lymphs Abs 0.1 (L) 0.7 - 4.0 K/uL   Monocytes Absolute 0.0 (L) 0.1 - 1.0 K/uL    Eosinophils Absolute 0.0 0.0 - 0.7 K/uL   Basophils Absolute 0.0 0.0 - 0.1 K/uL   Smear Review MORPHOLOGY UNREMARKABLE   Comprehensive metabolic panel     Status: Abnormal   Collection Time: 01/08/17  4:30 AM  Result Value Ref Range   Sodium 138 135 - 145 mmol/L   Potassium 3.7 3.5 - 5.1 mmol/L    Comment: SPECIMEN HEMOLYZED. HEMOLYSIS MAY AFFECT INTEGRITY OF RESULTS.   Chloride 113 (H) 101 - 111 mmol/L   CO2 17 (L) 22 - 32 mmol/L   Glucose, Bld 127 (H) 65 - 99 mg/dL   BUN 5 (L) 6 - 20 mg/dL   Creatinine, Ser 0.73 0.44 - 1.00 mg/dL   Calcium 7.7 (L) 8.9 - 10.3 mg/dL   Total Protein 4.5 (L) 6.5 - 8.1 g/dL   Albumin 2.6 (L) 3.5 - 5.0 g/dL   AST 261 (H) 15 - 41 U/L   ALT 172 (H) 14 - 54 U/L   Alkaline Phosphatase 166 (H) 38 - 126 U/L   Total Bilirubin 0.7 0.3 - 1.2 mg/dL   GFR calc non Af Amer >60 >60 mL/min   GFR calc Af Amer >60 >60 mL/min    Comment: (NOTE) The eGFR has been calculated using the CKD EPI equation. This calculation has not been validated in all clinical situations. eGFR's persistently <60 mL/min signify possible Chronic Kidney Disease.    Anion gap 8 5 - 15  ABO/Rh     Status: None   Collection Time: 01/08/17  4:30 AM  Result Value Ref Range   ABO/RH(D) O POS   Prepare Pheresed Platelets     Status: None (Preliminary result)   Collection Time:  01/08/17  6:47 AM  Result Value Ref Range   Unit Number [id number]    Blood Component Type PLTP LR1 PAS    Unit division 00    Status of Unit ISSUED    Transfusion Status OK TO TRANSFUSE   Sedimentation rate     Status: None   Collection Time: 01/08/17 11:51 AM  Result Value Ref Range   Sed Rate 6 0 - 22 mm/hr  CBC with Differential/Platelet     Status: Abnormal   Collection Time: 01/08/17 11:51 AM  Result Value Ref Range   WBC 0.9 (LL) 4.0 - 10.5 K/uL    Comment: REPEATED TO VERIFY CRITICAL VALUE NOTED.  VALUE IS CONSISTENT WITH PREVIOUSLY REPORTED AND CALLED VALUE.    RBC 3.23 (L) 3.87 - 5.11 MIL/uL    Hemoglobin 10.5 (L) 12.0 - 15.0 g/dL   HCT 31.0 (L) 36.0 - 46.0 %   MCV 96.0 78.0 - 100.0 fL   MCH 32.5 26.0 - 34.0 pg   MCHC 33.9 30.0 - 36.0 g/dL   RDW 12.0 11.5 - 15.5 %   Platelets 36 (L) 150 - 400 K/uL    Comment: REPEATED TO VERIFY CONSISTENT WITH PREVIOUS RESULT    Neutrophils Relative % 38 %   Lymphocytes Relative 44 %   Monocytes Relative 15 %   Eosinophils Relative 0 %   Basophils Relative 3 %   Neutro Abs 0.3 (L) 1.7 - 7.7 K/uL   Lymphs Abs 0.5 (L) 0.7 - 4.0 K/uL   Monocytes Absolute 0.1 0.1 - 1.0 K/uL   Eosinophils Absolute 0.0 0.0 - 0.7 K/uL   Basophils Absolute 0.0 0.0 - 0.1 K/uL   Smear Review MORPHOLOGY UNREMARKABLE    Ct Head Wo Contrast  Result Date: 01/07/2017 CLINICAL DATA:  Fever of unknown origin.  Headaches. EXAM: CT HEAD WITHOUT CONTRAST TECHNIQUE: Contiguous axial images were obtained from the base of the skull through the vertex without intravenous contrast. COMPARISON:  None. FINDINGS: Brain: Ventricles are normal in size and configuration. All areas of the brain demonstrate normal gray-white matter attenuation. There is no mass, hemorrhage, edema or other evidence of acute parenchymal abnormality. No extra-axial hemorrhage. Vascular: No hyperdense vessel or unexpected calcification. Skull: Normal. Negative for fracture or focal lesion. Sinuses/Orbits: No acute finding. Other: None. IMPRESSION: Negative head CT.  No intracranial mass, hemorrhage or edema. Electronically Signed   By: Franki Cabot M.D.   On: 01/07/2017 18:06   Ct Abdomen Pelvis W Contrast  Result Date: 01/08/2017 CLINICAL DATA:  Fever, thrombocytopenia and leukocytosis. EXAM: CT ABDOMEN AND PELVIS WITH CONTRAST TECHNIQUE: Multidetector CT imaging of the abdomen and pelvis was performed using the standard protocol following bolus administration of intravenous contrast. CONTRAST:  < 75 cc ISOVUE-300 IOPAMIDOL (ISOVUE-300) INJECTION 61% COMPARISON:  Chest CT 01/06/2017 FINDINGS: Lower chest:  Dependent subpleural bibasilar atelectasis but no infiltrates or pulmonary lesions. Very small pleural effusions. Heart is normal in size. No pericardial effusion. Small hiatal hernia. Hepatobiliary: Suspect gallbladder wall thickening/edema or possible small amount of pericholecystic fluid. No obvious gallstones. Right upper quadrant ultrasound may be helpful for further evaluation and to exclude cholecystitis. No focal hepatic lesions or intrahepatic biliary dilatation. The portal and splenic veins are patent. Pancreas: No mass, inflammation or ductal dilatation. Spleen: Within normal limits in size.  No focal lesions. Adrenals/Urinary Tract: The adrenal glands and kidneys are unremarkable. A very small midpole left renal calculus is noted. No obstructing ureteral calculi or bladder calculi. Small bilateral renal cysts. Stomach/Bowel:  The stomach, duodenum, small bowel and colon are unremarkable. No acute inflammatory changes, mass lesions or obstructive findings. The terminal ileum is normal. The appendix is surgically absent. Vascular/Lymphatic: The aorta and branch vessels are normal. The major venous structures are patent. No mesenteric or retroperitoneal mass or adenopathy. Reproductive: Retroverted uterus.  The ovaries are normal. Other: No pelvic mass or adenopathy. No free pelvic fluid collections. No inguinal mass or adenopathy. No abdominal wall hernia or subcutaneous lesions. Musculoskeletal: No significant bony findings. A few small scattered sclerotic bone lesions are likely benign bone islands. IMPRESSION: 1. Suspect gallbladder wall thickening or pericholecystic fluid. Could not exclude cholecystitis. Recommend followup right upper quadrant ultrasound examination and correlation with any right upper quadrant abdominal pain. No biliary dilatation. 2. Bibasilar atelectasis and very small pleural effusions. 3. Small midpole left renal calculus but no obstructing ureteral calculi or bladder calculi.  Electronically Signed   By: Marijo Sanes M.D.   On: 01/08/2017 12:11      Assessment/Plan Possible cholecystitis - Patient is not having any abdominal pain and is not tender on exam - Do not suspect that her fever and other lab abnormalities are related to her gallbladder, LFTs were normal upon arrival to the ED - Will obtain HIDA scan tomorrow - Would recommend consulting infectious disease in the setting of fever, leukopenia, thrombocytopenia and transaminitis  We will continue to follow this patient. Thank you for the consult  Kalman Drape, [facility] Surgery 01/08/2017, 2:55 PM Pager: [phone] Consults: [phone] Mon-Fri 7:00 am-4:30 pm Sat-Sun 7:00 am-11:30 am

## 2017-01-08 NOTE — Progress Notes (Signed)
Protective precautions initiated per order. Signs hung outside room and patient, husband and other staff educated.

## 2017-01-08 NOTE — Progress Notes (Signed)
Spoke to Dr. Verlon Au to inform him that NM scan of gallbladder will be tomorrow morning since patient needs to be NPO for atleast 6hrs. (will be NPO after midnight and general surgery will be consulted). Also informed him of patient most recent temp of 102.2. Tylenol was given.

## 2017-01-08 NOTE — Progress Notes (Addendum)
CT abdomen pelvis concerning for possible cholecystitis LFTs are slightly elevated She had some nausea as per RN and now has another temperature of 1o2 We will repeat  blood cultures 2 I have asked general surgery to swing by and look in on the patient HIDA scan has been ordered but is pending as patient needs to be nothing by mouth for 6 hours and will be done 01/09/17

## 2017-01-09 ENCOUNTER — Inpatient Hospital Stay (HOSPITAL_COMMUNITY): Payer: BC Managed Care – PPO

## 2017-01-09 DIAGNOSIS — F419 Anxiety disorder, unspecified: Secondary | ICD-10-CM

## 2017-01-09 DIAGNOSIS — D696 Thrombocytopenia, unspecified: Secondary | ICD-10-CM

## 2017-01-09 DIAGNOSIS — R509 Fever, unspecified: Secondary | ICD-10-CM

## 2017-01-09 DIAGNOSIS — F329 Major depressive disorder, single episode, unspecified: Secondary | ICD-10-CM

## 2017-01-09 DIAGNOSIS — R42 Dizziness and giddiness: Secondary | ICD-10-CM

## 2017-01-09 DIAGNOSIS — M549 Dorsalgia, unspecified: Secondary | ICD-10-CM

## 2017-01-09 DIAGNOSIS — R0602 Shortness of breath: Secondary | ICD-10-CM

## 2017-01-09 DIAGNOSIS — D72819 Decreased white blood cell count, unspecified: Secondary | ICD-10-CM

## 2017-01-09 DIAGNOSIS — G8929 Other chronic pain: Secondary | ICD-10-CM

## 2017-01-09 LAB — COMPREHENSIVE METABOLIC PANEL
ALT: 153 U/L — AB (ref 14–54)
AST: 168 U/L — AB (ref 15–41)
Albumin: 2.7 g/dL — ABNORMAL LOW (ref 3.5–5.0)
Alkaline Phosphatase: 217 U/L — ABNORMAL HIGH (ref 38–126)
Anion gap: 7 (ref 5–15)
BUN: <5 mg/dL — ABNORMAL LOW (ref 6–20)
CHLORIDE: 110 mmol/L (ref 101–111)
CO2: 19 mmol/L — AB (ref 22–32)
CREATININE: 0.74 mg/dL (ref 0.44–1.00)
Calcium: 7.7 mg/dL — ABNORMAL LOW (ref 8.9–10.3)
GFR calc non Af Amer: >60 mL/min (ref >60)
Glucose, Bld: 92 mg/dL (ref 65–99)
Potassium: 3.2 mmol/L — ABNORMAL LOW (ref 3.5–5.1)
SODIUM: 136 mmol/L (ref 135–145)
Total Bilirubin: 0.6 mg/dL (ref 0.3–1.2)
Total Protein: 5.1 g/dL — ABNORMAL LOW (ref 6.5–8.1)

## 2017-01-09 LAB — CBC WITH DIFFERENTIAL/PLATELET
Basophils Absolute: 0.1 10*3/uL (ref 0.0–0.1)
Basophils Relative: 4 %
EOS ABS: 0 10*3/uL (ref 0.0–0.7)
Eosinophils Relative: 0 %
HCT: 31.8 % — ABNORMAL LOW (ref 36.0–46.0)
Hemoglobin: 10.8 g/dL — ABNORMAL LOW (ref 12.0–15.0)
Lymphocytes Relative: 44 %
Lymphs Abs: 0.7 10*3/uL (ref 0.7–4.0)
MCH: 32.3 pg (ref 26.0–34.0)
MCHC: 34 g/dL (ref 30.0–36.0)
MCV: 95.2 fL (ref 78.0–100.0)
MONO ABS: 0.3 10*3/uL (ref 0.1–1.0)
Monocytes Relative: 18 %
NEUTROS PCT: 34 %
Neutro Abs: 0.6 10*3/uL — ABNORMAL LOW (ref 1.7–7.7)
PLATELETS: 45 10*3/uL — AB (ref 150–400)
RBC: 3.34 MIL/uL — AB (ref 3.87–5.11)
RDW: 12.1 % (ref 11.5–15.5)
WBC: 1.7 10*3/uL — AB (ref 4.0–10.5)

## 2017-01-09 LAB — PREPARE PLATELET PHERESIS: Unit division: 0

## 2017-01-09 LAB — EHRLICHIA ANTIBODY PANEL
E chaffeensis (HGE) Ab, IgG: NEGATIVE
E chaffeensis (HGE) Ab, IgM: NEGATIVE
E. CHAFFEENSIS (HME) IGM TITER: NEGATIVE
E.Chaffeensis (HME) IgG: NEGATIVE

## 2017-01-09 LAB — HEPATITIS PANEL, ACUTE
HCV Ab: <0.1 s/co ratio (ref 0.0–0.9)
HEP A IGM: NEGATIVE
HEP B C IGM: NEGATIVE
HEP B S AG: NEGATIVE

## 2017-01-09 LAB — BPAM PLATELET PHERESIS
BLOOD PRODUCT EXPIRATION DATE: 201807202359
ISSUE DATE / TIME: 201807200800
UNIT TYPE AND RH: 6200

## 2017-01-09 MED ORDER — IBUPROFEN 400 MG PO TABS
400.0000 mg | ORAL_TABLET | Freq: Four times a day (QID) | ORAL | Status: DC | PRN
  Administered 2017-01-09 – 2017-01-10 (×2): 400 mg via ORAL
  Filled 2017-01-09 (×2): qty 1

## 2017-01-09 MED ORDER — TECHNETIUM TC 99M MEBROFENIN IV KIT
5.4400 | PACK | Freq: Once | INTRAVENOUS | Status: AC | PRN
  Administered 2017-01-09: 5.44 via INTRAVENOUS

## 2017-01-09 MED ORDER — IBUPROFEN 400 MG PO TABS
400.0000 mg | ORAL_TABLET | Freq: Four times a day (QID) | ORAL | Status: DC | PRN
Start: 2017-01-09 — End: 2017-01-09

## 2017-01-09 NOTE — Progress Notes (Signed)
Subjective/Chief Complaint: No abdominal pain   Objective: Vital signs in last 24 hours: Temp:  [99 F (37.2 C)-102.9 F (39.4 C)] 102.1 F (38.9 C) (07/21 1051) Pulse Rate:  [81-103] 94 (07/21 1051) Resp:  [17-18] 17 (07/21 1051) BP: (106-125)/(67-77) 121/68 (07/21 1051) SpO2:  [96 %-99 %] 97 % (07/21 1051) Last BM Date: 01/04/17  Intake/Output from previous day: 07/20 0701 - 07/21 0700 In: 4618.8 [P.O.:840; I.V.:2853.8; Blood:325; IV [id number]] Out: 300 [Urine:300] Intake/Output this shift: No intake/output data recorded.  General appearance: cooperative Resp: few rhonchi GI: nontender, soft  Lab Results:   Recent Labs  01/08/17 1151 01/09/17 0341  WBC 0.9* 1.7*  HGB 10.5* 10.8*  HCT 31.0* 31.8*  PLT 36* 45*   BMET  Recent Labs  01/08/17 0430 01/09/17 0341  NA 138 136  K 3.7 3.2*  CL 113* 110  CO2 17* 19*  GLUCOSE 127* 92  BUN 5* <5*  CREATININE 0.73 0.74  CALCIUM 7.7* 7.7*   PT/INR  Recent Labs  01/07/17 0821 01/07/17 1712  LABPROT 15.1 15.2  INR 1.18 1.19   ABG No results for input(s): PHART, HCO3 in the last 72 hours.  Invalid input(s): PCO2, PO2  Studies/Results: Ct Head Wo Contrast  Result Date: 01/07/2017 CLINICAL DATA:  Fever of unknown origin.  Headaches. EXAM: CT HEAD WITHOUT CONTRAST TECHNIQUE: Contiguous axial images were obtained from the base of the skull through the vertex without intravenous contrast. COMPARISON:  None. FINDINGS: Brain: Ventricles are normal in size and configuration. All areas of the brain demonstrate normal gray-white matter attenuation. There is no mass, hemorrhage, edema or other evidence of acute parenchymal abnormality. No extra-axial hemorrhage. Vascular: No hyperdense vessel or unexpected calcification. Skull: Normal. Negative for fracture or focal lesion. Sinuses/Orbits: No acute finding. Other: None. IMPRESSION: Negative head CT.  No intracranial mass, hemorrhage or edema. Electronically Signed    By: Franki Cabot M.D.   On: 01/07/2017 18:06   Ct Abdomen Pelvis W Contrast  Result Date: 01/08/2017 CLINICAL DATA:  Fever, thrombocytopenia and leukocytosis. EXAM: CT ABDOMEN AND PELVIS WITH CONTRAST TECHNIQUE: Multidetector CT imaging of the abdomen and pelvis was performed using the standard protocol following bolus administration of intravenous contrast. CONTRAST:  < 75 cc ISOVUE-300 IOPAMIDOL (ISOVUE-300) INJECTION 61% COMPARISON:  Chest CT 01/06/2017 FINDINGS: Lower chest: Dependent subpleural bibasilar atelectasis but no infiltrates or pulmonary lesions. Very small pleural effusions. Heart is normal in size. No pericardial effusion. Small hiatal hernia. Hepatobiliary: Suspect gallbladder wall thickening/edema or possible small amount of pericholecystic fluid. No obvious gallstones. Right upper quadrant ultrasound may be helpful for further evaluation and to exclude cholecystitis. No focal hepatic lesions or intrahepatic biliary dilatation. The portal and splenic veins are patent. Pancreas: No mass, inflammation or ductal dilatation. Spleen: Within normal limits in size.  No focal lesions. Adrenals/Urinary Tract: The adrenal glands and kidneys are unremarkable. A very small midpole left renal calculus is noted. No obstructing ureteral calculi or bladder calculi. Small bilateral renal cysts. Stomach/Bowel: The stomach, duodenum, small bowel and colon are unremarkable. No acute inflammatory changes, mass lesions or obstructive findings. The terminal ileum is normal. The appendix is surgically absent. Vascular/Lymphatic: The aorta and branch vessels are normal. The major venous structures are patent. No mesenteric or retroperitoneal mass or adenopathy. Reproductive: Retroverted uterus.  The ovaries are normal. Other: No pelvic mass or adenopathy. No free pelvic fluid collections. No inguinal mass or adenopathy. No abdominal wall hernia or subcutaneous lesions. Musculoskeletal: No significant bony findings.  A few small scattered sclerotic bone lesions are likely benign bone islands. IMPRESSION: 1. Suspect gallbladder wall thickening or pericholecystic fluid. Could not exclude cholecystitis. Recommend followup right upper quadrant ultrasound examination and correlation with any right upper quadrant abdominal pain. No biliary dilatation. 2. Bibasilar atelectasis and very small pleural effusions. 3. Small midpole left renal calculus but no obstructing ureteral calculi or bladder calculi. Electronically Signed   By: Marijo Sanes M.D.   On: 01/08/2017 12:11   Nm Hepato W/eject Fract  Result Date: 01/09/2017 CLINICAL DATA:  Abnormal CT exam with minimal gallbladder wall thickening and questionable pericholecystic fluid concerning for acute cholecystitis EXAM: NUCLEAR MEDICINE HEPATOBILIARY IMAGING WITH GALLBLADDER EF TECHNIQUE: Sequential images of the abdomen were obtained out to 60 minutes following intravenous administration of radiopharmaceutical. After oral ingestion of Ensure, gallbladder ejection fraction was determined. At 60 min, normal ejection fraction is greater than 33%. RADIOPHARMACEUTICALS:  5.44 mCi Tc-27m  Choletec IV COMPARISON:  CT abdomen and pelvis 01/08/2017 FINDINGS: Normal tracer extraction from bloodstream indicating normal hepatocellular function. Normal excretion of tracer into biliary tree. Gallbladder visualized by 20 min. Small bowel visualized at 34 min. No hepatic retention of tracer. Subjectively normal emptying of tracer from gallbladder following fatty meal stimulation. Calculated gallbladder ejection fraction is 70%, normal. Patient reported no symptoms following Ensure ingestion. Normal gallbladder ejection fraction following Ensure ingestion is greater than 33% at 1 hour. IMPRESSION: Normal exam. Patent biliary tree with normal gallbladder ejection fraction of 70% following fatty meal stimulation. Electronically Signed   By: Lavonia Dana M.D.   On: 01/09/2017 11:13     Anti-infectives: Anti-infectives    Start     Dose/Rate Route Frequency Ordered Stop   01/08/17 0300  vancomycin (VANCOCIN) IVPB 1000 mg/200 mL premix  Status:  Discontinued     1,000 mg 200 mL/hr over 60 Minutes Intravenous Every 12 hours 01/07/17 1400 01/07/17 1644   01/07/17 2200  doxycycline (VIBRA-TABS) tablet 100 mg  Status:  Discontinued     100 mg Oral Every 12 hours 01/07/17 1644 01/07/17 1840   01/07/17 2000  doxycycline (VIBRAMYCIN) 100 mg in dextrose 5 % 250 mL IVPB     100 mg 125 mL/hr over 120 Minutes Intravenous Every 12 hours 01/07/17 1840     01/07/17 1430  vancomycin (VANCOCIN) IVPB 1000 mg/200 mL premix     1,000 mg 200 mL/hr over 60 Minutes Intravenous  Once 01/07/17 1400 01/07/17 1615   01/07/17 1430  piperacillin-tazobactam (ZOSYN) IVPB 3.375 g     3.375 g 12.5 mL/hr over 240 Minutes Intravenous Every 8 hours 01/07/17 1400     01/06/17 2200  vancomycin (VANCOCIN) IVPB 1000 mg/200 mL premix  Status:  Discontinued     1,000 mg 200 mL/hr over 60 Minutes Intravenous Every 12 hours 01/06/17 1248 01/06/17 1731   01/06/17 1830  cefTRIAXone (ROCEPHIN) 2 g in dextrose 5 % 50 mL IVPB  Status:  Discontinued     2 g 100 mL/hr over 30 Minutes Intravenous Every 24 hours 01/06/17 1731 01/07/17 1328   01/06/17 1700  piperacillin-tazobactam (ZOSYN) IVPB 3.375 g  Status:  Discontinued     3.375 g 12.5 mL/hr over 240 Minutes Intravenous Every 8 hours 01/06/17 0950 01/06/17 1731   01/06/17 0915  piperacillin-tazobactam (ZOSYN) IVPB 3.375 g     3.375 g 100 mL/hr over 30 Minutes Intravenous  Once 01/06/17 0914 01/06/17 1028   01/06/17 0915  vancomycin (VANCOCIN) IVPB 1000 mg/200 mL premix     1,000  mg 200 mL/hr over 60 Minutes Intravenous  Once 01/06/17 0914 01/06/17 1055      Assessment/Plan: HIDA shows normal GB appearance and function - no role for cholecystectomy, may advance diet. We will S.O. I spoke with her family.  LOS: 3 days    Ryer Asato E 01/09/2017

## 2017-01-09 NOTE — Consult Note (Signed)
[name]       Reason for Consult: fever    Referring Physician: Dr. Verlon Au  Active Problems:   Situational anxiety   Fatigue   Sepsis ([place])   Pyelonephritis   Hyperglycemia   Hypokalemia   . FLUoxetine  30 mg Oral Daily  . sodium chloride flush  3 mL Intravenous Q12H    Recommendations: Echo for ? Myocarditis SPEP  Consider bone marrow biopsy for infiltrative process including fungal and AFB stain and cultures along with flow cytometry Ferritin for ?Green  LDH  Assessment: She has a high fever of unknown etiology.  No infectious etiology identified.  I most suspect non infectious but RMSF possible.  Ehlichia also possible even with negative serology.   With the Rouleaux formation with leukopenia, thrombocytopenia, I think it is worth checking SPEP.  Her leukopenia, thrombocytopenia is improving.  Recent medications also possible causing infiltrative process  Antibiotics: Doxycycline, pip tazo  HPI: Brenda Foster is a 51 y.o. female with anxiety, depression, chronic mild back pain who presented initially no 7/18 with significant SOB, fatigue, dizziness which have all persisted.  She has had a high persistent fever up to 103, poor po, DOE.  Work up has found significant thrombocytopenia, leukopenia, some anemia.  CT C/A/P unrevealing for occult infection.  Her platelets and WBC have started to improve.  Ehrlichia Abs negative, blood cultures negative, ESR normal.  Lactate intiially up but normalized.  She started on doxycycline on admission along with pip tazo.  HIV negative   Review of Systems:  Constitutional: positive for fevers, chills, fatigue and malaise or negative for weight loss Respiratory: positive for dyspnea on exertion, negative for cough or sputum Cardiovascular: positive for dyspnea, negative for chest pressure/discomfort Gastrointestinal: negative for nausea and diarrhea Genitourinary: negative for frequency and  dysuria Hematologic/lymphatic: negative for lymphadenopathy Musculoskeletal: negative for myalgias and arthralgias All other systems reviewed and are negative    Past Medical History:  Diagnosis Date  . Anxiety   . Depression    Mild n/ infertility  . History of miscarriage   . Infertility   . Migraines   . Sepsis ([place]) 12/2016  . Urinary tract bacterial infections    2x  . Urine incontinence    Mild    Social History  Substance Use Topics  . Smoking status: Never Smoker  . Smokeless tobacco: Never Used  . Alcohol use 2.4 - 3.0 oz/week    4 - 5 Glasses of wine per week    Family History  Problem Relation Age of Onset  . Cancer Father        prostate  . Heart disease Father 47       MI, premature  . Stroke Father        Mind  . Stroke Maternal Grandfather   . Anemia Mother   . Autism spectrum disorder Daughter   . Stroke Maternal Grandmother     Allergies  Allergen Reactions  . Heparin     Do not give per MD  . Sulfa Antibiotics Hives    Physical Exam: Constitutional: non-toxic  Vitals:   01/09/17 1100 01/09/17 1336  BP: 124/74 132/77  Pulse: 96 97  Resp: 17 18  Temp: (!) 102.5 F (39.2 C) (!) 105.9 F (41.1 C)   EYES: anicteric ENMT: no thrush Cardiovascular: Cor RRR and No murmurs Respiratory: CTA B; normal respiratory effort GI: Bowel sounds are normal, liver is not enlarged, spleen is not enlarged,  soft, nt Musculoskeletal: no pedal edema noted Skin: negatives: no rash, no edema Hematologic: no cervical, no supraclavicular lad  Lab Results  Component Value Date   WBC 1.7 (L) 01/09/2017   HGB 10.8 (L) 01/09/2017   HCT 31.8 (L) 01/09/2017   MCV 95.2 01/09/2017   PLT 45 (L) 01/09/2017    Lab Results  Component Value Date   CREATININE 0.74 01/09/2017   BUN <5 (L) 01/09/2017   NA 136 01/09/2017   K 3.2 (L) 01/09/2017   CL 110 01/09/2017   CO2 19 (L) 01/09/2017    Lab Results  Component Value Date   ALT 153 (H) 01/09/2017   AST 168  (H) 01/09/2017   ALKPHOS 217 (H) 01/09/2017     Microbiology: Recent Results (from the past 240 hour(s))  Blood Culture (routine x 2)     Status: None (Preliminary result)   Collection Time: 01/06/17  9:30 AM  Result Value Ref Range Status   Specimen Description BLOOD RIGHT WRIST  Final   Special Requests   Final    BOTTLES DRAWN AEROBIC AND ANAEROBIC Blood Culture adequate volume   Culture NO GROWTH 2 DAYS  Final   Report Status PENDING  Incomplete  Blood Culture (routine x 2)     Status: None (Preliminary result)   Collection Time: 01/06/17  9:30 AM  Result Value Ref Range Status   Specimen Description BLOOD LEFT ANTECUBITAL  Final   Special Requests   Final    BOTTLES DRAWN AEROBIC AND ANAEROBIC Blood Culture adequate volume   Culture NO GROWTH 2 DAYS  Final   Report Status PENDING  Incomplete  Urine Culture     Status: Abnormal   Collection Time: 01/06/17 11:17 AM  Result Value Ref Range Status   Specimen Description URINE, RANDOM  Final   Special Requests NONE  Final   Culture <10,000 COLONIES/mL INSIGNIFICANT GROWTH (A)  Final   Report Status 01/07/2017 FINAL  Final    COMER, Herbie [place], [facility] [email or web address] [phone] pager  [phone] cell 01/09/2017, 2:20 PM

## 2017-01-09 NOTE — Progress Notes (Signed)
PROGRESS NOTE    Brenda Foster  FOY:[id number] DOB: Jul 15, 1965 DOA: 01/06/2017 PCP: Ma Hillock, DO  Outpatient Specialists:     Brief Narrative:  51 y/o ? Bipolar migraines Recent Rx for UTI-Keflex for e coli completed Rx Has had also a prolonged rhinitis Recent travel [place] Has had HA and weakness as main presenting c/o  Admitted with presumed UTI cxr neg No meningismus  Progressed to have myalgias as well as spiking temp despite rocephin 7/20--broadened back abx to Vanc Zosyn Discussed case with Dr. Linus Salmons of ID 7/19 and changed Abx as below Consulted Heme Work-up underway for FUO    Assessment & Plan:   Active Problems:   Situational anxiety   Fatigue   Sepsis ([facility])   Pyelonephritis   Hyperglycemia   Hypokalemia   Fever of unknown origin--broad differential  Met acidosis presumably from SIRS--SHE IS NOT SEPTIC Etiology unknown  Discontinued vancomycin-started doxycycline under direction of Dr. 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 Jana Hakim initially consulted 7/19.  We will discuss further with oncology regarding ? Plasma cell dyscrasia vs not  ECHO ordered 7/21  ? Drug abuse--drug screen is pending  Await ANA, HIV, heavy metal screen, RMSF, HIV,   ESR is 6  Ehrlichia neg  hepatitis panel neg  HIT panel neg   Cholecystitis  CT scan abdomen pelvis ? Cholecystitis--CT reviewed personally--rim of fluid around GB??   LFT wnl on adm 7/21it, predominant transaminase elevation from 7/20  HIDA neg  Appreciate Gen surg input-they have signed  off  ? Interstitial nephritis  Unclear this is an actual diagnosis   Stop pyridium  Hypertension with complication of hypotension  We have held propanolol 10 twice a day  Fluids NS 125 cc/h-->50 cc/h  Bipolar  Continue Prozac 30, Ativan 0.5-1 mg every 6 when necessary anxiety  DC Lovenox as mild thrombocytopenia SCDs Discussed with husband  Inpatient    Consultants:   None yet  Procedures:     Antimicrobials:   Vancomycin + Zosyn 7/18   Doxycycline 7/20   Subjective:  Weak tired and ill No fever Husband at bedside-understandably concerned Continues to have fever despite antibiotics No bleeding Ate breakfast and lunch No difficulty swallowing No cp No rash    Objective: Vitals:   01/08/17 2303 01/09/17 0201 01/09/17 0601 01/09/17 0655  BP:  121/74 125/77   Pulse:  89 (!) 103   Resp:  18 18   Temp: 99.3 F (37.4 C) 99 F (37.2 C) (!) 102.9 F (39.4 C) (!) 101.1 F (38.4 C)  TempSrc: Oral Oral Oral   SpO2:  97% 98%   Weight:      Height:        Intake/Output Summary (Last 24 hours) at 01/09/17 0741 Last data filed at 01/09/17 0602  Gross per 24 hour  Intake          4618.75 ml  Output              300 ml  Net          4318.75 ml   [employer]  01/06/17 0835  Weight: 71.7 kg (158 lb)    Examination:  Pale weak and hands are swollen Tired, dry mucosa Chest is clear without any rails or rhonchi Rrr, no mr No abd discomfort nor swelling not distended Grade 1 le edema Power 5/5  Data Reviewed: I have personally reviewed following labs and imaging studies  CBC:  Recent Labs Lab 01/07/17 1435 01/07/17 1712 01/08/17 0430 01/08/17 1151 01/09/17 0341  WBC QUESTIONABLE RESULTS, RECOMMEND RECOLLECT TO VERIFY 1.2* 0.2* 0.9* 1.7*  NEUTROABS QUESTIONABLE RESULTS, RECOMMEND RECOLLECT TO VERIFY 0.9* 0.1* 0.3* 0.6*  HGB 11.1* 10.6* 9.6* 10.5* 10.8*  HCT 31.8* 31.9* 27.5* 31.0* 31.8*  MCV 97.0 97.3 97.2 96.0 95.2  PLT 60* 58*  60*  <5* 36* 45*   Basic Metabolic Panel:  Recent Labs Lab 01/06/17 0838 01/07/17 0119 01/08/17 0430 01/09/17 0341  NA 134* 139 138 136  K 3.4* 3.7 3.7 3.2*  CL 107 113* 113* 110  CO2 16* 20* 17* 19*  GLUCOSE 128* 103* 127* 92  BUN 8 6 5* <5*  CREATININE 0.89 0.80 0.73 0.74  CALCIUM 9.1 7.8* 7.7* 7.7*   GFR: Estimated Creatinine Clearance: 78.6 mL/min (by C-G formula based on SCr of 0.74 mg/dL). Liver Function Tests:  Recent Labs Lab 01/06/17 0838 01/08/17 0430 01/09/17 0341  AST 38 261* 168*  ALT 22 172* 153*  ALKPHOS 86 166* 217*  BILITOT 0.8 0.7 0.6  PROT 6.5 4.5* 5.1*  ALBUMIN 3.9 2.6* 2.7*   No results for input(s): LIPASE, AMYLASE in the last 168 hours. No results for input(s): AMMONIA in the last 168 hours. Coagulation Profile:  Recent Labs Lab 01/07/17 0821 01/07/17 1712  INR 1.18 1.19   Cardiac Enzymes: No results for input(s): CKTOTAL, CKMB, CKMBINDEX, TROPONINI in the last 168 hours. BNP (last 3 results) No results for input(s): PROBNP in the last 8760 hours. HbA1C: No results for input(s): HGBA1C in the last 72 hours. CBG:  Recent Labs Lab 01/06/17 0934  GLUCAP 97   Lipid Profile: No results for input(s): CHOL, HDL, LDLCALC, TRIG, CHOLHDL, LDLDIRECT in the last 72 hours. Thyroid Function Tests: No results for input(s): TSH, T4TOTAL, FREET4, T3FREE, THYROIDAB in the last 72 hours. Anemia Panel: No results for input(s): VITAMINB12, FOLATE, FERRITIN, TIBC, IRON, RETICCTPCT in the last 72 hours. Urine analysis:    Component Value Date/Time   COLORURINE YELLOW 01/06/2017 1114   APPEARANCEUR HAZY (A) 01/06/2017 1114   LABSPEC 1.014 01/06/2017 1114   PHURINE 6.0 01/06/2017 1114   GLUCOSEU NEGATIVE 01/06/2017 1114   HGBUR SMALL (A) 01/06/2017 1114   BILIRUBINUR NEGATIVE 01/06/2017 1114   BILIRUBINUR Small 12/25/2016 1440   KETONESUR 20 (A) 01/06/2017 1114   PROTEINUR NEGATIVE 01/06/2017 1114   UROBILINOGEN 0.2 12/25/2016 1440   NITRITE  NEGATIVE 01/06/2017 1114   LEUKOCYTESUR LARGE (A) 01/06/2017 1114   Sepsis Labs: _0 (procalcitonin:4,lacticidven:4)  ) Recent Results (from the past 240 hour(s))  Blood Culture (routine x 2)     Status: None (Preliminary result)   Collection Time: 01/06/17  9:30 AM  Result Value Ref Range Status   Specimen Description BLOOD RIGHT WRIST  Final   Special Requests   Final    BOTTLES DRAWN AEROBIC AND ANAEROBIC Blood Culture adequate volume   Culture NO GROWTH 2 DAYS  Final   Report Status PENDING  Incomplete  Blood Culture (routine x 2)     Status: None (Preliminary result)   Collection Time: 01/06/17  9:30 AM  Result Value Ref Range  Status   Specimen Description BLOOD LEFT ANTECUBITAL  Final   Special Requests   Final    BOTTLES DRAWN AEROBIC AND ANAEROBIC Blood Culture adequate volume   Culture NO GROWTH 2 DAYS  Final   Report Status PENDING  Incomplete  Urine Culture     Status: Abnormal   Collection Time: 01/06/17 11:17 AM  Result Value Ref Range Status   Specimen Description URINE, RANDOM  Final   Special Requests NONE  Final   Culture <10,000 COLONIES/mL INSIGNIFICANT GROWTH (A)  Final   Report Status 01/07/2017 FINAL  Final         Radiology Studies: Ct Head Wo Contrast  Result Date: 01/07/2017 CLINICAL DATA:  Fever of unknown origin.  Headaches. EXAM: CT HEAD WITHOUT CONTRAST TECHNIQUE: Contiguous axial images were obtained from the base of the skull through the vertex without intravenous contrast. COMPARISON:  None. FINDINGS: Brain: Ventricles are normal in size and configuration. All areas of the brain demonstrate normal gray-white matter attenuation. There is no mass, hemorrhage, edema or other evidence of acute parenchymal abnormality. No extra-axial hemorrhage. Vascular: No hyperdense vessel or unexpected calcification. Skull: Normal. Negative for fracture or focal lesion. Sinuses/Orbits: No acute finding. Other: None. IMPRESSION: Negative head CT.  No  intracranial mass, hemorrhage or edema. Electronically Signed   By: Stan  Maynard M.D.   On: 01/07/2017 18:06   Ct Abdomen Pelvis W Contrast  Result Date: 01/08/2017 CLINICAL DATA:  Fever, thrombocytopenia and leukocytosis. EXAM: CT ABDOMEN AND PELVIS WITH CONTRAST TECHNIQUE: Multidetector CT imaging of the abdomen and pelvis was performed using the standard protocol following bolus administration of intravenous contrast. CONTRAST:  < 75 cc ISOVUE-300 IOPAMIDOL (ISOVUE-300) INJECTION 61% COMPARISON:  Chest CT 01/06/2017 FINDINGS: Lower chest: Dependent subpleural bibasilar atelectasis but no infiltrates or pulmonary lesions. Very small pleural effusions. Heart is normal in size. No pericardial effusion. Small hiatal hernia. Hepatobiliary: Suspect gallbladder wall thickening/edema or possible small amount of pericholecystic fluid. No obvious gallstones. Right upper quadrant ultrasound may be helpful for further evaluation and to exclude cholecystitis. No focal hepatic lesions or intrahepatic biliary dilatation. The portal and splenic veins are patent. Pancreas: No mass, inflammation or ductal dilatation. Spleen: Within normal limits in size.  No focal lesions. Adrenals/Urinary Tract: The adrenal glands and kidneys are unremarkable. A very small midpole left renal calculus is noted. No obstructing ureteral calculi or bladder calculi. Small bilateral renal cysts. Stomach/Bowel: The stomach, duodenum, small bowel and colon are unremarkable. No acute inflammatory changes, mass lesions or obstructive findings. The terminal ileum is normal. The appendix is surgically absent. Vascular/Lymphatic: The aorta and branch vessels are normal. The major venous structures are patent. No mesenteric or retroperitoneal mass or adenopathy. Reproductive: Retroverted uterus.  The ovaries are normal. Other: No pelvic mass or adenopathy. No free pelvic fluid collections. No inguinal mass or adenopathy. No abdominal wall hernia or  subcutaneous lesions. Musculoskeletal: No significant bony findings. A few small scattered sclerotic bone lesions are likely benign bone islands. IMPRESSION: 1. Suspect gallbladder wall thickening or pericholecystic fluid. Could not exclude cholecystitis. Recommend followup right upper quadrant ultrasound examination and correlation with any right upper quadrant abdominal pain. No biliary dilatation. 2. Bibasilar atelectasis and very small pleural effusions. 3. Small midpole left renal calculus but no obstructing ureteral calculi or bladder calculi. Electronically Signed   By: P.  Gallerani M.D.   On: 01/08/2017 12:11        Scheduled Meds: . FLUoxetine  30 mg Oral Daily  . sodium   chloride flush  3 mL Intravenous Q12H   Continuous Infusions: . sodium chloride 125 mL/hr at 01/09/17 0001  . sodium chloride    . doxycycline (VIBRAMYCIN) IV Stopped (01/08/17 2204)  . piperacillin-tazobactam (ZOSYN)  IV 3.375 g (01/09/17 0545)  . sodium chloride Stopped (01/06/17 1027)     LOS: 3 days    Time spent: 35    Jai Samtani, MD [facility] (P) [phone]   If 7PM-7AM, please contact night-coverage [email or web address] Password [email or web address] 01/09/2017, 7:41 AM    

## 2017-01-10 DIAGNOSIS — R5081 Fever presenting with conditions classified elsewhere: Secondary | ICD-10-CM

## 2017-01-10 DIAGNOSIS — R945 Abnormal results of liver function studies: Secondary | ICD-10-CM

## 2017-01-10 DIAGNOSIS — R7989 Other specified abnormal findings of blood chemistry: Secondary | ICD-10-CM

## 2017-01-10 DIAGNOSIS — R05 Cough: Secondary | ICD-10-CM

## 2017-01-10 DIAGNOSIS — B349 Viral infection, unspecified: Secondary | ICD-10-CM

## 2017-01-10 LAB — CBC WITH DIFFERENTIAL/PLATELET
BASOS PCT: 3 %
Basophils Absolute: 0.1 10*3/uL (ref 0.0–0.1)
EOS ABS: 0 10*3/uL (ref 0.0–0.7)
Eosinophils Relative: 0 %
HCT: 28.3 % — ABNORMAL LOW (ref 36.0–46.0)
Hemoglobin: 9.9 g/dL — ABNORMAL LOW (ref 12.0–15.0)
LYMPHS ABS: 1.7 10*3/uL (ref 0.7–4.0)
Lymphocytes Relative: 45 %
MCH: 33.1 pg (ref 26.0–34.0)
MCHC: 35 g/dL (ref 30.0–36.0)
MCV: 94.6 fL (ref 78.0–100.0)
MONO ABS: 0.7 10*3/uL (ref 0.1–1.0)
Monocytes Relative: 19 %
NEUTROS ABS: 1.3 10*3/uL — AB (ref 1.7–7.7)
NEUTROS PCT: 33 %
PLATELETS: 61 10*3/uL — AB (ref 150–400)
RBC: 2.99 MIL/uL — ABNORMAL LOW (ref 3.87–5.11)
RDW: 12.2 % (ref 11.5–15.5)
WBC: 3.8 10*3/uL — ABNORMAL LOW (ref 4.0–10.5)

## 2017-01-10 LAB — COMPREHENSIVE METABOLIC PANEL
ALT: 126 U/L — ABNORMAL HIGH (ref 14–54)
ANION GAP: 6 (ref 5–15)
AST: 107 U/L — ABNORMAL HIGH (ref 15–41)
Albumin: 2.4 g/dL — ABNORMAL LOW (ref 3.5–5.0)
Alkaline Phosphatase: 189 U/L — ABNORMAL HIGH (ref 38–126)
BUN: <5 mg/dL — ABNORMAL LOW (ref 6–20)
CHLORIDE: 111 mmol/L (ref 101–111)
CO2: 24 mmol/L (ref 22–32)
Calcium: 8.1 mg/dL — ABNORMAL LOW (ref 8.9–10.3)
Creatinine, Ser: 0.7 mg/dL (ref 0.44–1.00)
GFR calc non Af Amer: >60 mL/min (ref >60)
Glucose, Bld: 104 mg/dL — ABNORMAL HIGH (ref 65–99)
POTASSIUM: 3 mmol/L — AB (ref 3.5–5.1)
SODIUM: 141 mmol/L (ref 135–145)
Total Bilirubin: 0.7 mg/dL (ref 0.3–1.2)
Total Protein: 4.6 g/dL — ABNORMAL LOW (ref 6.5–8.1)

## 2017-01-10 LAB — LACTATE DEHYDROGENASE: LDH: 372 U/L — AB (ref 98–192)

## 2017-01-10 LAB — FERRITIN: Ferritin: 653 ng/mL — ABNORMAL HIGH (ref 11–307)

## 2017-01-10 LAB — ANTI-DNASE B ANTIBODY

## 2017-01-10 LAB — ROCKY MTN SPOTTED FVR ABS PNL(IGG+IGM)
RMSF IGG: NEGATIVE
RMSF IgM: 0.25 index (ref 0.00–0.89)

## 2017-01-10 MED ORDER — POTASSIUM CHLORIDE 20 MEQ PO PACK
40.0000 meq | PACK | Freq: Two times a day (BID) | ORAL | Status: DC
  Administered 2017-01-10 (×2): 40 meq via ORAL
  Filled 2017-01-10 (×3): qty 2

## 2017-01-10 NOTE — Progress Notes (Signed)
Asked to see Brenda Foster due to persistent fever and abnormal blood counts. Her white cell count is improving. Her platelet count is improving.  She had a CT of the abdomen and pelvis, days ago. This may have shown some gallbladder thickening.  She does have elevated LFTs. They do seem to be improving. I find no causes that her liver function tests are improving as well as her blood counts.  I wonder if she doesn't have some kind of viral syndrome. CMV or Epstein-Barr infection is a possibility.  I would get her blood under the microscope. I do not see anything with her cell lines that looked suspicious for an underlying malignancy.  She is on antibiotics with doxycycline and Zosyn.  I think that her leukopenia and thrombocytopenia are probably reflective of transient bone marrow suppression by this underlying infection. Again, a viral syndrome certainly would be plausible. Just having hepatic inflammation can cause some element of pancytopenia.   On her physical exam, her blood pressure is 99/58. Her temperature is 98.2. Pulse is 68. There is no lymphadenopathy. She has no splenomegaly. Is hard to stay if her liver is minimally enlarged. I may feel it at the right costal margin. Her lungs are clear. Cardiac exam regular rate and rhythm. Extremities shows no clubbing, cyanosis or edema. She has good range of motion of her joints. There is no joint erythema, warmth or swelling. Skin exam shows no rashes, ecchymoses or petechia. Neurological exam shows no focal neurological deficits.   For now, I just think that her blood counts will improve with time. Typically, with a viral syndrome, he can take 10-14 days before we start to see normalization of blood counts.  I don't see any evidence of a bone marrow syndrome. As such, I don't thing we have to put her through a bone marrow biopsy.  Lattie Haw, MD  1 Thessalonians 5:16-18

## 2017-01-10 NOTE — Progress Notes (Signed)
[facility]   Reason for visit: Follow up on fever  Interval History: leukopenia significantly recovering, platelets increasing.  No fever since last pm.  Feels anxious, husband at bedside.  No associated n/v/d.  No rashes.  New dry cough.    Physical Exam: Constitutional:  Vitals:   01/10/17 0551 01/10/17 1430  BP: (!) 99/58 109/66  Pulse: 68 81  Resp: 18 16  Temp: 98.2 F (36.8 C) 99.1 F (37.3 C)   patient appears in NAD, tired appearing Eyes: anicteric HENT: no thrush Respiratory: Normal respiratory effort; CTA B Cardiovascular: RRR GI: soft, nt, nd  Review of Systems: Constitutional: positive for tired, anxious Respiratory: positive for cough, negative for sputum Gastrointestinal: positive for nausea, negative for diarrhea Integument/breast: negative for rash  Lab Results  Component Value Date   WBC 3.8 (L) 01/10/2017   HGB 9.9 (L) 01/10/2017   HCT 28.3 (L) 01/10/2017   MCV 94.6 01/10/2017   PLT 61 (L) 01/10/2017    Lab Results  Component Value Date   CREATININE 0.70 01/10/2017   BUN <5 (L) 01/10/2017   NA 141 01/10/2017   K 3.0 (L) 01/10/2017   CL 111 01/10/2017   CO2 24 01/10/2017    Lab Results  Component Value Date   ALT 126 (H) 01/10/2017   AST 107 (H) 01/10/2017   ALKPHOS 189 (H) 01/10/2017     Microbiology: Recent Results (from the past 240 hour(s))  Blood Culture (routine x 2)     Status: None (Preliminary result)   Collection Time: 01/06/17  9:30 AM  Result Value Ref Range Status   Specimen Description BLOOD RIGHT WRIST  Final   Special Requests   Final    BOTTLES DRAWN AEROBIC AND ANAEROBIC Blood Culture adequate volume   Culture NO GROWTH 4 DAYS  Final   Report Status PENDING  Incomplete  Blood Culture (routine x 2)     Status: None (Preliminary result)   Collection Time: 01/06/17  9:30 AM  Result Value Ref Range Status   Specimen Description BLOOD LEFT ANTECUBITAL  Final   Special Requests   Final   BOTTLES DRAWN AEROBIC AND ANAEROBIC Blood Culture adequate volume   Culture NO GROWTH 4 DAYS  Final   Report Status PENDING  Incomplete  Urine Culture     Status: Abnormal   Collection Time: 01/06/17 11:17 AM  Result Value Ref Range Status   Specimen Description URINE, RANDOM  Final   Special Requests NONE  Final   Culture <10,000 COLONIES/mL INSIGNIFICANT GROWTH (A)  Final   Report Status 01/07/2017 FINAL  Final  Culture, blood (routine x 2)     Status: None (Preliminary result)   Collection Time: 01/08/17  3:16 PM  Result Value Ref Range Status   Specimen Description BLOOD LEFT HAND  Final   Special Requests AEROBIC BOTTLE ONLY Blood Culture adequate volume  Final   Culture NO GROWTH 2 DAYS  Final   Report Status PENDING  Incomplete  Culture, blood (routine x 2)     Status: None (Preliminary result)   Collection Time: 01/08/17  3:20 PM  Result Value Ref Range Status   Specimen Description BLOOD LEFT HAND  Final   Special Requests AEROBIC BOTTLE ONLY Blood Culture adequate volume  Final   Culture NO GROWTH 2 DAYS  Final   Report Status PENDING  Incomplete    Impression/Plan:  1. Fever associated with leukopenia, thrombocytopenia - with such recovery, lessening of fever,  I agree with Dr. Marin Olp that this is most c/w a viral process.  RMSF also still possible despite the negative tests since it was early.   If blood cultures remain negative in the am, will stop zosyn Continue doxycycline for 7 days total Other possibility still medications.    2. Cough - I suspect some element of atelectasis.  I emphasized incentive spirometry and sitting up, getting up.  3.  Anxiety - underlying mental illness.  Getting ativan.

## 2017-01-10 NOTE — Progress Notes (Signed)
PROGRESS NOTE    Brenda Foster  JQB:[id number] DOB: 1966/05/27 DOA: 01/06/2017 PCP: Ma Hillock, DO  Outpatient Specialists:     Brief Narrative:  51 y/o ? Bipolar migraines Recent Rx for UTI-Keflex for e coli completed Rx Has had also a prolonged rhinitis Recent travel [place] Has had HA and weakness as main presenting c/o  Admitted with presumed UTI cxr neg No meningismus  Progressed to have myalgias as well as spiking temp despite rocephin 7/20--broadened back abx to Vanc Zosyn Discussed case with Dr. Linus Salmons of ID 7/19 and changed Abx as below Consulted Heme Work-up underway for FUO    Assessment & Plan:   Active Problems:   Situational anxiety   Fatigue   Sepsis (HCC)   Pyelonephritis   Hyperglycemia   Hypokalemia   Fever of unknown origin--broad differential  Met acidosis presumably from SIRS--SHE IS NOT SEPTIC--metabolic acidosis resolved 7/22 Etiology unknown  Fever seems to have stopped and patient seems better 7/22  Discontinued vanc 7/19 -started doxycycline  7/19 under direction of Dr. Linus Salmons ID  Keep Zosyn for now  [facility] 7/18 ngtd, urine culture also negative  BC rpt 7/20 pending  White count 0.2-0.9-1.7-->3.8  Idiopathic bone marrow suppression-? Keflex versus serum sickness Acute thrombocytopenia platelets less than 5 down from 60 7/19--resolving slowly s/p 1 unit PLT 7/20  PLT less than 5 on 7/19 --> 63 -ANC now  1300 and discontinued utropenic precautions 7/22 Elevated transaminases, elevated alkaline phosphatase--trending downward 7/22, note reversal of.alb/total protein ration--?? SPEP ordered and pending Oncologist Dr. Jana Hakim initially consulted 7/19.  Dr. Marin Olp to follow-up 7/22 --? Myeloma --ferritin elevated, LDH elevated  ECHO ordered 7/21 and is pending   ? Drug abuse--drug screen is pending  Await ANA, HIV, heavy metal screen,  ESR is 6  HIV negative  Ehrlichia neg  RMSF[-]  hepatitis panel neg  HIT panel  neg   Cholecystitis  CT scan abdomen pelvis ? Cholecystitis unlikely as HIDA neg  Appreciate Gen surg input-they have signed off  ? Interstitial nephritis  Unclear this is an actual diagnosis   Stop pyridium 7/21   Hypertension with complication of hypotension  We have held propanolol 10 twice a day  Fluids NS 125 cc/h-->50 cc/h  Hypokalemia 3.0 on 7/22  Replaced with oral K Dur  Bipolar  Continue Prozac 30, Ativan 0.5-1 mg every 6 when necessary anxiety  DC Lovenox as mild thrombocytopenia SCDs Discussed with husband at bedside on all days she has been here Inpatient    Consultants:   None yet  Procedures:     Antimicrobials:   Vancomycin + Zosyn 7/18   Doxycycline 7/20   Subjective:  Looks better feels better eating better still weak No fever No chills No nausea No vomiting  No stool since last _0 4.17 ml  Output                0 ml  Net           994.17 ml   Filed Weights   01/06/17 0835  Weight: 71.7 kg (158 lb)    Examination:  Throat is soft and supple  Do not appreciate lymphadenopathy No thyromegaly Chest clear no murmur sinus rhythm Abdomen soft slightly tender epigastrium right upper quadrant Neurologically intact moving all 4 limbs Muscular skeletal intact Tired but looks better than she did for   Data Reviewed: I have personally reviewed following labs and imaging studies  CBC:  Recent Labs Lab 01/07/17 1712 01/08/17 0430 01/08/17 1151  01/09/17 0341 01/10/17 0448  WBC 1.2* 0.2* 0.9* 1.7* 3.8*  NEUTROABS 0.9* 0.1* 0.3* 0.6* 1.3*  HGB 10.6* 9.6* 10.5* 10.8* 9.9*  HCT 31.9* 27.5* 31.0* 31.8* 28.3*  MCV 97.3 97.2 96.0 95.2 94.6  PLT 58*  60* <5* 36* 45* 61*   Basic Metabolic Panel:  Recent Labs Lab 01/06/17 0838 01/07/17 0119 01/08/17 0430 01/09/17 0341 01/10/17 0448  NA 134* 139 138 136 141  K 3.4* 3.7 3.7 3.2* 3.0*  CL 107 113* 113* 110 111  CO2 16* 20* 17* 19* 24  GLUCOSE 128* 103* 127* 92 104*  BUN 8 6 5* <5* <5*  CREATININE 0.89 0.80 0.73 0.74 0.70  CALCIUM 9.1 7.8* 7.7* 7.7* 8.1*   GFR: Estimated Creatinine Clearance: 78.6 mL/min (by C-G formula based on SCr of 0.7 mg/dL). Liver Function Tests:  Recent Labs Lab 01/06/17 0838 01/08/17 0430 01/09/17 0341 01/10/17 0448  AST 38 261* 168* 107*  ALT 22 172* 153* 126*  ALKPHOS 86 166* 217* 189*  BILITOT 0.8 0.7 0.6 0.7  PROT 6.5 4.5* 5.1* 4.6*  ALBUMIN 3.9 2.6* 2.7* 2.4*   No results for input(s): LIPASE, AMYLASE in the last 168 hours. No results for input(s): AMMONIA in the last 168 hours. Coagulation Profile:  Recent Labs Lab 01/07/17 0821 01/07/17 1712  INR 1.18 1.19   Cardiac Enzymes: No results for input(s): CKTOTAL, CKMB, CKMBINDEX, TROPONINI in the last 168 hours. BNP (last 3 results) No results for input(s): PROBNP in the last 8760 hours. HbA1C: No results for input(s): HGBA1C in the last 72 hours. CBG:  Recent Labs Lab 01/06/17 0934  GLUCAP 97   Lipid Profile: No results for input(s): CHOL, HDL, LDLCALC, TRIG, CHOLHDL, LDLDIRECT in the last 72 hours. Thyroid Function Tests: No results for input(s): TSH, T4TOTAL, FREET4, T3FREE, THYROIDAB in the last 72 hours. Anemia Panel:  Recent Labs  01/10/17 0448  FERRITIN 653*   Urine analysis:    Component Value Date/Time   COLORURINE YELLOW 01/06/2017 1114   APPEARANCEUR HAZY (A) 01/06/2017 1114   LABSPEC 1.014 01/06/2017 1114   PHURINE 6.0 01/06/2017 1114   GLUCOSEU  NEGATIVE 01/06/2017 1114   HGBUR SMALL (A) 01/06/2017 1114   BILIRUBINUR NEGATIVE 01/06/2017 1114   BILIRUBINUR Small 12/25/2016 1440   KETONESUR 20 (A) 01/06/2017 1114   PROTEINUR NEGATIVE 01/06/2017 1114   UROBILINOGEN 0.2 12/25/2016 1440   NITRITE NEGATIVE 01/06/2017 1114   LEUKOCYTESUR LARGE (A) 01/06/2017 1114   Sepsis Labs: _0 (procalcitonin:4,lacticidven:4)  ) Recent Results (from the past 240 hour(s))  Blood Culture (routine x 2)     Status: None (Preliminary result)   Collection Time: 01/06/17  9:30 AM  Result Value Ref Range Status   Specimen Description BLOOD RIGHT WRIST  Final   Special Requests   Final  BOTTLES DRAWN AEROBIC AND ANAEROBIC Blood Culture adequate volume   Culture NO GROWTH 3 DAYS  Final   Report Status PENDING  Incomplete  Blood Culture (routine x 2)     Status: None (Preliminary result)   Collection Time: 01/06/17  9:30 AM  Result Value Ref Range Status   Specimen Description BLOOD LEFT ANTECUBITAL  Final   Special Requests   Final    BOTTLES DRAWN AEROBIC AND ANAEROBIC Blood Culture adequate volume   Culture NO GROWTH 3 DAYS  Final   Report Status PENDING  Incomplete  Urine Culture     Status: Abnormal   Collection Time: 01/06/17 11:17 AM  Result Value Ref Range Status   Specimen Description URINE, RANDOM  Final   Special Requests NONE  Final   Culture <10,000 COLONIES/mL INSIGNIFICANT GROWTH (A)  Final   Report Status 01/07/2017 FINAL  Final  Culture, blood (routine x 2)     Status: None (Preliminary result)   Collection Time: 01/08/17  3:16 PM  Result Value Ref Range Status   Specimen Description BLOOD LEFT HAND  Final   Special Requests AEROBIC BOTTLE ONLY Blood Culture adequate volume  Final   Culture NO GROWTH 1 DAY  Final   Report Status PENDING  Incomplete  Culture, blood (routine x 2)     Status: None (Preliminary result)   Collection Time: 01/08/17  3:20 PM  Result Value Ref Range Status   Specimen Description BLOOD LEFT  HAND  Final   Special Requests AEROBIC BOTTLE ONLY Blood Culture adequate volume  Final   Culture NO GROWTH 1 DAY  Final   Report Status PENDING  Incomplete         Radiology Studies: Ct Abdomen Pelvis W Contrast  Result Date: 01/08/2017 CLINICAL DATA:  Fever, thrombocytopenia and leukocytosis. EXAM: CT ABDOMEN AND PELVIS WITH CONTRAST TECHNIQUE: Multidetector CT imaging of the abdomen and pelvis was performed using the standard protocol following bolus administration of intravenous contrast. CONTRAST:  < 75 cc ISOVUE-300 IOPAMIDOL (ISOVUE-300) INJECTION 61% COMPARISON:  Chest CT 01/06/2017 FINDINGS: Lower chest: Dependent subpleural bibasilar atelectasis but no infiltrates or pulmonary lesions. Very small pleural effusions. Heart is normal in size. No pericardial effusion. Small hiatal hernia. Hepatobiliary: Suspect gallbladder wall thickening/edema or possible small amount of pericholecystic fluid. No obvious gallstones. Right upper quadrant ultrasound may be helpful for further evaluation and to exclude cholecystitis. No focal hepatic lesions or intrahepatic biliary dilatation. The portal and splenic veins are patent. Pancreas: No mass, inflammation or ductal dilatation. Spleen: Within normal limits in size.  No focal lesions. Adrenals/Urinary Tract: The adrenal glands and kidneys are unremarkable. A very small midpole left renal calculus is noted. No obstructing ureteral calculi or bladder calculi. Small bilateral renal cysts. Stomach/Bowel: The stomach, duodenum, small bowel and colon are unremarkable. No acute inflammatory changes, mass lesions or obstructive findings. The terminal ileum is normal. The appendix is surgically absent. Vascular/Lymphatic: The aorta and branch vessels are normal. The major venous structures are patent. No mesenteric or retroperitoneal mass or adenopathy. Reproductive: Retroverted uterus.  The ovaries are normal. Other: No pelvic mass or adenopathy. No free pelvic  fluid collections. No inguinal mass or adenopathy. No abdominal wall hernia or subcutaneous lesions. Musculoskeletal: No significant bony findings. A few small scattered sclerotic bone lesions are likely benign bone islands. IMPRESSION: 1. Suspect gallbladder wall thickening or pericholecystic fluid. Could not exclude cholecystitis. Recommend followup right upper quadrant ultrasound examination and correlation with any right upper quadrant abdominal pain.  No biliary dilatation. 2. Bibasilar atelectasis and very small pleural effusions. 3. Small midpole left renal calculus but no obstructing ureteral calculi or bladder calculi. Electronically Signed   By: Marijo Sanes M.D.   On: 01/08/2017 12:11   Nm Hepato W/eject Fract  Result Date: 01/09/2017 CLINICAL DATA:  Abnormal CT exam with minimal gallbladder wall thickening and questionable pericholecystic fluid concerning for acute cholecystitis EXAM: NUCLEAR MEDICINE HEPATOBILIARY IMAGING WITH GALLBLADDER EF TECHNIQUE: Sequential images of the abdomen were obtained out to 60 minutes following intravenous administration of radiopharmaceutical. After oral ingestion of Ensure, gallbladder ejection fraction was determined. At 60 min, normal ejection fraction is greater than 33%. RADIOPHARMACEUTICALS:  5.44 mCi Tc-46m Choletec IV COMPARISON:  CT abdomen and pelvis 01/08/2017 FINDINGS: Normal tracer extraction from bloodstream indicating normal hepatocellular function. Normal excretion of tracer into biliary tree. Gallbladder visualized by 20 min. Small bowel visualized at 34 min. No hepatic retention of tracer. Subjectively normal emptying of tracer from gallbladder following fatty meal stimulation. Calculated gallbladder ejection fraction is 70%, normal. Patient reported no symptoms following Ensure ingestion. Normal gallbladder ejection fraction following Ensure ingestion is greater than 33% at 1 hour. IMPRESSION: Normal exam. Patent biliary tree with normal  gallbladder ejection fraction of 70% following fatty meal stimulation. Electronically Signed   By: MLavonia DanaM.D.   On: 01/09/2017 11:13        Scheduled Meds: . FLUoxetine  30 mg Oral Daily  . sodium chloride flush  3 mL Intravenous Q12H   Continuous Infusions: . sodium chloride 50 mL/hr at 01/09/17 2137  . sodium chloride    . doxycycline (VIBRAMYCIN) IV Stopped (01/10/17 0349)  . piperacillin-tazobactam (ZOSYN)  IV 3.375 g (01/10/17 0551)  . sodium chloride Stopped (01/06/17 1027)     LOS: 4 days    Time spent: 3[place] MD [facility] ([facility]  If 7PM-7AM, please contact night-coverage [email or web address] Password [facility]7/22/2018, 9:50 AM

## 2017-01-10 NOTE — Progress Notes (Signed)
Pharmacy Antibiotic Note  Brenda Foster is a 51 y.o. female admitted on 01/06/2017 with sepsis and aspiration pneumonia.  Pharmacy has been following for Zosyn dosing.  She is also receiving Doxycycline 100mg  bid.  [place] titer was negative.  Fever seems to be resolving and WBC seems to be trending to normal.  Renal function remains stable with creatinine at 0.7.  Plan: Zosyn 3.375g IV q8, infuse over 4hr F/U cx results.  Height: 5' 6.5" (168.9 cm) Weight: 158 lb (71.7 kg) IBW/kg (Calculated) : 60.45  Temp (24hrs), Avg:100.3 F (37.9 C), Min:98.2 F (36.8 C), Max:102.7 F (39.3 C)   Recent Labs Lab 01/06/17 0838 01/06/17 0904 01/06/17 1158 01/06/17 1947 01/07/17 0119  01/07/17 1712 01/08/17 0430 01/08/17 1151 01/09/17 0341 01/10/17 0448  WBC 2.2*  --   --   --  2.1*  < > 1.2* 0.2* 0.9* 1.7* 3.8*  CREATININE 0.89  --   --   --  0.80  --   --  0.73  --  0.74 0.70  LATICACIDVEN  --  3.24* 1.87 2.3* 1.1  --   --   --   --   --   --   < > = values in this interval not displayed.  Estimated Creatinine Clearance: 78.6 mL/min (by C-G formula based on SCr of 0.7 mg/dL).    Allergies  Allergen Reactions  . Heparin     Do not give per MD  . Sulfa Antibiotics Hives    7/18 Zosyn >> 7/18, resumed 7/19 7/18 vancomycin >> 7/18, 7/19>7/20 7/18 Ceftriaxone >> 7/19  7/20 Doxycycline >>  7/18 BCx: ntd 7/18 UCx: < 10K insig 7/18 Influenza 7/20 BCx:    Rober Minion, PharmD., MS Clinical Pharmacist Pager:  [phone] Thank you for allowing pharmacy to be part of this patients care team.

## 2017-01-11 ENCOUNTER — Inpatient Hospital Stay (HOSPITAL_COMMUNITY): Payer: BC Managed Care – PPO

## 2017-01-11 DIAGNOSIS — R0602 Shortness of breath: Secondary | ICD-10-CM

## 2017-01-11 LAB — ECHOCARDIOGRAM COMPLETE
AOASC: 31 cm
AVLVOTPG: 6 mmHg
CHL CUP DOP CALC LVOT VTI: 26.4 cm
CHL CUP STROKE VOLUME: 63 mL
E/e' ratio: 8.37
EWDT: 324 ms
FS: 30 % (ref 28–44)
HEIGHTINCHES: 66.5 in
IV/PV OW: 0.92
LA ID, A-P, ES: 25 mm
LADIAMINDEX: 1.37 cm/m2
LAVOL: 54.9 mL
LAVOLA4C: 37 mL
LAVOLIN: 30.2 mL/m2
LEFT ATRIUM END SYS DIAM: 25 mm
LV E/e' medial: 8.37
LV E/e'average: 8.37
LV PW d: 12.8 mm — AB (ref 0.6–1.1)
LV SIMPSON'S DISK: 71
LV TDI E'LATERAL: 10.9
LV TDI E'MEDIAL: 7.94
LV dias vol: 89 mL (ref 46–106)
LV e' LATERAL: 10.9 cm/s
LV sys vol index: 14 mL/m2
LVDIAVOLIN: 49 mL/m2
LVOT SV: 91 mL
LVOT area: 3.46 cm2
LVOT peak vel: 123 cm/s
LVOTD: 21 mm
LVSYSVOL: 26 mL (ref 14–42)
MV Dec: 324
MV Peak grad: 3 mmHg
MVPKAVEL: 75.2 m/s
MVPKEVEL: 91.2 m/s
MVSPHT: 188 ms
RV LATERAL S' VELOCITY: 14 cm/s
RV sys press: 22 mmHg
Reg peak vel: 218 cm/s
TAPSE: 21.2 mm
TRMAXVEL: 218 cm/s
WEIGHTICAEL: 2528 [oz_av]

## 2017-01-11 LAB — COMPREHENSIVE METABOLIC PANEL
ALT: 147 U/L — ABNORMAL HIGH (ref 14–54)
ANION GAP: 4 — AB (ref 5–15)
AST: 124 U/L — ABNORMAL HIGH (ref 15–41)
Albumin: 2.3 g/dL — ABNORMAL LOW (ref 3.5–5.0)
Alkaline Phosphatase: 178 U/L — ABNORMAL HIGH (ref 38–126)
BUN: 5 mg/dL — ABNORMAL LOW (ref 6–20)
CHLORIDE: 111 mmol/L (ref 101–111)
CO2: 24 mmol/L (ref 22–32)
Calcium: 8.1 mg/dL — ABNORMAL LOW (ref 8.9–10.3)
Creatinine, Ser: 0.6 mg/dL (ref 0.44–1.00)
GFR calc non Af Amer: >60 mL/min (ref >60)
Glucose, Bld: 84 mg/dL (ref 65–99)
Potassium: 3.8 mmol/L (ref 3.5–5.1)
SODIUM: 139 mmol/L (ref 135–145)
Total Bilirubin: 0.6 mg/dL (ref 0.3–1.2)
Total Protein: 4.9 g/dL — ABNORMAL LOW (ref 6.5–8.1)

## 2017-01-11 LAB — CBC WITH DIFFERENTIAL/PLATELET
BASOS PCT: 1 %
Basophils Absolute: 0 10*3/uL (ref 0.0–0.1)
EOS ABS: 0.1 10*3/uL (ref 0.0–0.7)
EOS PCT: 2 %
HCT: 28.8 % — ABNORMAL LOW (ref 36.0–46.0)
HEMOGLOBIN: 9.8 g/dL — AB (ref 12.0–15.0)
LYMPHS PCT: 70 %
Lymphs Abs: 2.9 10*3/uL (ref 0.7–4.0)
MCH: 32.2 pg (ref 26.0–34.0)
MCHC: 34 g/dL (ref 30.0–36.0)
MCV: 94.7 fL (ref 78.0–100.0)
Monocytes Absolute: 0.3 10*3/uL (ref 0.1–1.0)
Monocytes Relative: 7 %
NEUTROS PCT: 20 %
Neutro Abs: 0.8 10*3/uL — ABNORMAL LOW (ref 1.7–7.7)
Platelets: 87 10*3/uL — ABNORMAL LOW (ref 150–400)
RBC: 3.04 MIL/uL — AB (ref 3.87–5.11)
RDW: 12.4 % (ref 11.5–15.5)
WBC: 4.1 10*3/uL (ref 4.0–10.5)

## 2017-01-11 LAB — HEAVY METALS, BLOOD
ARSENIC: 7 ug/L (ref 2–23)
LEAD: NOT DETECTED ug/dL (ref 0–19)
MERCURY: NOT DETECTED ug/L (ref 0.0–14.9)

## 2017-01-11 LAB — CBC
HCT: 27.3 % — ABNORMAL LOW (ref 36.0–46.0)
Hemoglobin: 9.4 g/dL — ABNORMAL LOW (ref 12.0–15.0)
MCH: 32.5 pg (ref 26.0–34.0)
MCHC: 34.4 g/dL (ref 30.0–36.0)
MCV: 94.5 fL (ref 78.0–100.0)
PLATELETS: 80 10*3/uL — AB (ref 150–400)
RBC: 2.89 MIL/uL — AB (ref 3.87–5.11)
RDW: 12.4 % (ref 11.5–15.5)
WBC: 4.4 10*3/uL (ref 4.0–10.5)

## 2017-01-11 LAB — CULTURE, BLOOD (ROUTINE X 2)
CULTURE: NO GROWTH
Culture: NO GROWTH
SPECIAL REQUESTS: ADEQUATE
SPECIAL REQUESTS: ADEQUATE

## 2017-01-11 MED ORDER — POTASSIUM CHLORIDE CRYS ER 20 MEQ PO TBCR
40.0000 meq | EXTENDED_RELEASE_TABLET | Freq: Every day | ORAL | Status: DC
  Administered 2017-01-11 – 2017-01-12 (×2): 40 meq via ORAL
  Filled 2017-01-11 (×2): qty 2

## 2017-01-11 MED ORDER — DOXYCYCLINE HYCLATE 100 MG PO TABS
100.0000 mg | ORAL_TABLET | Freq: Two times a day (BID) | ORAL | Status: DC
  Administered 2017-01-11 – 2017-01-12 (×3): 100 mg via ORAL
  Filled 2017-01-11 (×3): qty 1

## 2017-01-11 NOTE — Progress Notes (Signed)
  Echocardiogram 2D Echocardiogram has been performed.  Bobbye Charleston 01/11/2017, 10:10 AM

## 2017-01-11 NOTE — Care Management Note (Signed)
Case Management Note  Patient Details  Name: Brenda Foster MRN: [id number] Date of Birth: 1965/07/17  Subjective/Objective:  Pt admitted on 01/06/17 with fever, SOB.  PTA, pt independent, lives with spouse.                Action/Plan: PT eval today; recommending no OP follow up, RW.  Family able to provide assistance at dc.   Expected Discharge Date:                  Expected Discharge Plan:  Home/Self Care  In-House Referral:     Discharge planning Services  CM Consult  Post Acute Care Choice:    Choice offered to:     DME Arranged:    DME Agency:     HH Arranged:    HH Agency:     Status of Service:  In process, will continue to follow  If discussed at Long Length of Stay Meetings, dates discussed:    Additional Comments:  [name], RN 01/11/2017, 3:05 PM

## 2017-01-11 NOTE — Progress Notes (Signed)
PROGRESS NOTE    Brenda Foster  ZOX:[id number] DOB: July 28, 1965 DOA: 01/06/2017 PCP: Ma Hillock, DO  Outpatient Specialists:     Brief Narrative:  51 y/o ? Bipolar migraines Recent Rx for UTI-Keflex for e coli completed Rx Has had also a prolonged rhinitis Recent travel [place] Has had HA and weakness as main presenting c/o  Admitted with presumed UTI cxr neg No meningismus  Progressed to have myalgias as well as spiking temp despite rocephin 7/20--broadened back abx to Vanc Zosyn Discussed case with Dr. Linus Salmons of ID 7/19 and changed Abx as below Consulted Heme Work-up underway for FUO    Assessment & Plan:   Active Problems:   Situational anxiety   Fatigue   Sepsis (HCC)   Pyelonephritis   Hyperglycemia   Hypokalemia   Fever of unknown origin--broad differential  Met acidosis presumably from SIRS--SHE IS NOT SEPTIC--metabolic acidosis resolved 7/22 Etiology unknown  Fever seems to have stopped and patient seems better 7/22  Discontinued vanc 7/19 -started doxycycline  7/19 under direction of Dr. Linus Salmons ID   Switched to PO doxy 7/23--Zosyn discontinued 7/23  BC 7/18 ngtd, urine culture also negative  BC rpt 7/20 pending  White count 0.2-0.9-1.7-->3.8-->4  Idiopathic bone marrow suppression-? Keflex versus serum sickness-improving significantly now Acute thrombocytopenia  platelets less than 5 down from 60 7/19--resolving  s/p 1 unit PLT 7/20  PLT less than 5 on 7/19 --> 63--->80 currently - discontinued neutropenic precautions 7/22 Elevated transaminases, elevated alkaline phosphatase--astill 100 range , bili 0.6 note reversal of.alb/total protein ration-- ?? SPEP ordered and pending Oncologist Dr. Jana Hakim initially consulted 7/19.  Dr. Marin Olp appreciated Ferritin elevated, LDH elevated--could be non specific   ECHO TTE 7/23 [-] concerns of endocarditis  ? Drug abuse--drug screen is pending  Await  heavy metal screen, spep  ESR is 6  HIV  negative  Ehrlichia neg  RMSF[-]  hepatitis panel neg  HIT panel neg  Ds dna .[-]    Cholecystitis  CT scan abdomen pelvis ? Cholecystitis unlikely as HIDA neg  Appreciate Gen surg input-they have signed off  ? Interstitial nephritis  Unclear this is an actual diagnosis   Stop pyridium 7/21   Hypertension with complication of hypotension  We have held propanolol 10 twice a day  Fluids NS 125 cc/h-->50 cc/h, discontinue IV fluids 7/23  Hypokalemia 3.0 on 7/22  Replaced with oral K Dur 40 daily  Recheck am  Bipolar  Continue Prozac 30, Ativan 0.5-1 mg every 6 when necessary anxiety  DC Lovenox as mild thrombocytopenia SCDs Discussed with husband at bedside on all days she has been here Inpatient She was max assist--need therapy input prior to d/c    Consultants:   None yet  Procedures:     Antimicrobials:   Vancomycin + Zosyn 7/18 -->7/23  Doxycycline 7/20-->PO 7/23   Subjective:  Much better In chair Eating some Still weak feels about 25% of herself No n/v/rash/myalfgia Still cough     Objective: Vitals:   01/10/17 1754 01/10/17 1909 01/10/17 2116 01/11/17 0421  BP: (!) 177/76  105/71 110/77  Pulse: 75  71 66  Resp: _0 Temp: (!) 100.5 F (38.1 C) 100 F (37.8 C) 99.1 F (37.3 C) 98.6 F (37 C)  TempSrc: Oral Oral Oral Oral  SpO2: 99%  96% 98%  Weight:      Height:        Intake/Output Summary (Last 24 hours) at 01/11/17 8119 Last data filed at  01/10/17 2322  Gross per 24 hour  Intake          2094.17 ml  Output                1 ml  Net          2093.17 ml   Filed Weights   01/06/17 0835  Weight: 71.7 kg (158 lb)    Examination:  Pleasant alert oriented in nad--countenance much improved No ict no pallor No LAN cta b No rales s1 s2 no m/r/g Neuro intact Psych anxious   Data Reviewed: I have personally reviewed following labs and imaging studies  CBC:  Recent Labs Lab 01/07/17 1712 01/08/17 0430  01/08/17 1151 01/09/17 0341 01/10/17 0448 01/11/17 0554  WBC 1.2* 0.2* 0.9* 1.7* 3.8* 4.4  NEUTROABS 0.9* 0.1* 0.3* 0.6* 1.3*  --   HGB 10.6* 9.6* 10.5* 10.8* 9.9* 9.4*  HCT 31.9* 27.5* 31.0* 31.8* 28.3* 27.3*  MCV 97.3 97.2 96.0 95.2 94.6 94.5  PLT 58*  60* <5* 36* 45* 61* 80*   Basic Metabolic Panel:  Recent Labs Lab 01/07/17 0119 01/08/17 0430 01/09/17 0341 01/10/17 0448 01/11/17 0554  NA 139 138 136 141 139  K 3.7 3.7 3.2* 3.0* 3.8  CL 113* 113* 110 111 111  CO2 20* 17* 19* 24 24  GLUCOSE 103* 127* 92 104* 84  BUN 6 5* <5* <5* 5*  CREATININE 0.80 0.73 0.74 0.70 0.60  CALCIUM 7.8* 7.7* 7.7* 8.1* 8.1*   GFR: Estimated Creatinine Clearance: 78.6 mL/min (by C-G formula based on SCr of 0.6 mg/dL). Liver Function Tests:  Recent Labs Lab 01/06/17 0838 01/08/17 0430 01/09/17 0341 01/10/17 0448 01/11/17 0554  AST 38 261* 168* 107* 124*  ALT 22 172* 153* 126* 147*  ALKPHOS 86 166* 217* 189* 178*  BILITOT 0.8 0.7 0.6 0.7 0.6  PROT 6.5 4.5* 5.1* 4.6* 4.9*  ALBUMIN 3.9 2.6* 2.7* 2.4* 2.3*   No results for input(s): LIPASE, AMYLASE in the last 168 hours. No results for input(s): AMMONIA in the last 168 hours. Coagulation Profile:  Recent Labs Lab 01/07/17 0821 01/07/17 1712  INR 1.18 1.19   Cardiac Enzymes: No results for input(s): CKTOTAL, CKMB, CKMBINDEX, TROPONINI in the last 168 hours. BNP (last 3 results) No results for input(s): PROBNP in the last 8760 hours. HbA1C: No results for input(s): HGBA1C in the last 72 hours. CBG:  Recent Labs Lab 01/06/17 0934  GLUCAP 97   Lipid Profile: No results for input(s): CHOL, HDL, LDLCALC, TRIG, CHOLHDL, LDLDIRECT in the last 72 hours. Thyroid Function Tests: No results for input(s): TSH, T4TOTAL, FREET4, T3FREE, THYROIDAB in the last 72 hours. Anemia Panel:  Recent Labs  01/10/17 0448  FERRITIN 653*   Urine analysis:    Component Value Date/Time   COLORURINE YELLOW 01/06/2017 1114   APPEARANCEUR  HAZY (A) 01/06/2017 1114   LABSPEC 1.014 01/06/2017 1114   PHURINE 6.0 01/06/2017 1114   GLUCOSEU NEGATIVE 01/06/2017 1114   HGBUR SMALL (A) 01/06/2017 1114   BILIRUBINUR NEGATIVE 01/06/2017 1114   BILIRUBINUR Small 12/25/2016 1440   KETONESUR 20 (A) 01/06/2017 1114   PROTEINUR NEGATIVE 01/06/2017 1114   UROBILINOGEN 0.2 12/25/2016 1440   NITRITE NEGATIVE 01/06/2017 1114   LEUKOCYTESUR LARGE (A) 01/06/2017 1114   Sepsis Labs: _0 (procalcitonin:4,lacticidven:4)  ) Recent Results (from the past 240 hour(s))  Blood Culture (routine x 2)     Status: None (Preliminary result)   Collection Time: 01/06/17  9:30 AM  Result Value Ref  Range Status   Specimen Description BLOOD RIGHT WRIST  Final   Special Requests   Final    BOTTLES DRAWN AEROBIC AND ANAEROBIC Blood Culture adequate volume   Culture NO GROWTH 4 DAYS  Final   Report Status PENDING  Incomplete  Blood Culture (routine x 2)     Status: None (Preliminary result)   Collection Time: 01/06/17  9:30 AM  Result Value Ref Range Status   Specimen Description BLOOD LEFT ANTECUBITAL  Final   Special Requests   Final    BOTTLES DRAWN AEROBIC AND ANAEROBIC Blood Culture adequate volume   Culture NO GROWTH 4 DAYS  Final   Report Status PENDING  Incomplete  Urine Culture     Status: Abnormal   Collection Time: 01/06/17 11:17 AM  Result Value Ref Range Status   Specimen Description URINE, RANDOM  Final   Special Requests NONE  Final   Culture <10,000 COLONIES/mL INSIGNIFICANT GROWTH (A)  Final   Report Status 01/07/2017 FINAL  Final  Culture, blood (routine x 2)     Status: None (Preliminary result)   Collection Time: 01/08/17  3:16 PM  Result Value Ref Range Status   Specimen Description BLOOD LEFT HAND  Final   Special Requests AEROBIC BOTTLE ONLY Blood Culture adequate volume  Final   Culture NO GROWTH 2 DAYS  Final   Report Status PENDING  Incomplete  Culture, blood (routine x 2)     Status: None (Preliminary result)    Collection Time: 01/08/17  3:20 PM  Result Value Ref Range Status   Specimen Description BLOOD LEFT HAND  Final   Special Requests AEROBIC BOTTLE ONLY Blood Culture adequate volume  Final   Culture NO GROWTH 2 DAYS  Final   Report Status PENDING  Incomplete         Radiology Studies: Nm Hepato W/eject Fract  Result Date: 01/09/2017 CLINICAL DATA:  Abnormal CT exam with minimal gallbladder wall thickening and questionable pericholecystic fluid concerning for acute cholecystitis EXAM: NUCLEAR MEDICINE HEPATOBILIARY IMAGING WITH GALLBLADDER EF TECHNIQUE: Sequential images of the abdomen were obtained out to 60 minutes following intravenous administration of radiopharmaceutical. After oral ingestion of Ensure, gallbladder ejection fraction was determined. At 60 min, normal ejection fraction is greater than 33%. RADIOPHARMACEUTICALS:  5.44 mCi Tc-33m Choletec IV COMPARISON:  CT abdomen and pelvis 01/08/2017 FINDINGS: Normal tracer extraction from bloodstream indicating normal hepatocellular function. Normal excretion of tracer into biliary tree. Gallbladder visualized by 20 min. Small bowel visualized at 34 min. No hepatic retention of tracer. Subjectively normal emptying of tracer from gallbladder following fatty meal stimulation. Calculated gallbladder ejection fraction is 70%, normal. Patient reported no symptoms following Ensure ingestion. Normal gallbladder ejection fraction following Ensure ingestion is greater than 33% at 1 hour. IMPRESSION: Normal exam. Patent biliary tree with normal gallbladder ejection fraction of 70% following fatty meal stimulation. Electronically Signed   By: MLavonia DanaM.D.   On: 01/09/2017 11:13        Scheduled Meds: . FLUoxetine  30 mg Oral Daily  . potassium chloride  40 mEq Oral BID  . sodium chloride flush  3 mL Intravenous Q12H   Continuous Infusions: . sodium chloride 50 mL/hr at 01/10/17 2240  . sodium chloride    . doxycycline (VIBRAMYCIN) IV  Stopped (01/11/17 0122)  . piperacillin-tazobactam (ZOSYN)  IV 3.375 g (01/11/17 0634)  . sodium chloride Stopped (01/06/17 1027)     LOS: 5 days    Time spent: 15  Verneita Griffes, MD [phone]   If 7PM-7AM, please contact night-coverage [email or web address] Password [facility] 01/11/2017, 7:27 AM

## 2017-01-11 NOTE — Evaluation (Signed)
Physical Therapy Evaluation Patient Details Name: Brenda Foster MRN: [id number] DOB: 01/11/1966 Today's Date: 01/11/2017   History of Present Illness  Ptis a 51 y.o.femalewith medical history significant of anxiety/depression, migraines. Patient's initial presenting complaint was for fever and SOB. Patient states that her shortness of breath started approximately 4 weeks ago after developing a UTI. Pt admitted with fever of unknown origin and metabolic acidosis.  Clinical Impression  Pt admitted with above diagnosis. Pt currently with functional limitations due to the deficits listed below (see PT Problem List). At the time of PT eval pt was able to perform transfers and ambulation with min guard to occasional min assist for balance support and safety. Overall tolerance for functional activity is decreased, and pt reports extreme fatigue after gait training. Next session will plan for stair training if pt can tolerate. Pt will benefit from skilled PT to increase their independence and safety with mobility to allow discharge to the venue listed below.       Follow Up Recommendations No PT follow up;Supervision for mobility/OOB    Equipment Recommendations  Rolling walker with 5" wheels    Recommendations for Other Services       Precautions / Restrictions Precautions Precautions: Fall Restrictions Weight Bearing Restrictions: No      Mobility  Bed Mobility Overal bed mobility: Modified Independent Bed Mobility: Sit to Supine           General bed mobility comments: No assist required. Pt completed without difficulty.   Transfers Overall transfer level: Needs assistance Equipment used: Rolling walker (2 wheeled);None Transfers: Sit to/from Stand Sit to Stand: Min assist         General transfer comment: Without RW for support, pt was provided min assist by therapist to gain/maintain standing balance prior to initiating gait training.    Ambulation/Gait Ambulation/Gait assistance: Min guard;Min assist Ambulation Distance (Feet): 200 Feet Assistive device: 1 person hand held assist Gait Pattern/deviations: Step-through pattern;Decreased stride length;Trunk flexed Gait velocity: Decreased Gait velocity interpretation: Below normal speed for age/gender General Gait Details: Gross min guard assist provided throughout gait training, and occasional min assist provided for unsteadiness. Pt reports she typically wears shoes all the time, and today was ambulating in hospital socks. Noted increased ankle strategy to compensate and maintain balance throughout gait training. Although had HHA from therapist, was not relying on therapist for balance support.   Stairs            Wheelchair Mobility    Modified Rankin (Stroke Patients Only)       Balance Overall balance assessment: Needs assistance Sitting-balance support: Feet supported;No upper extremity supported Sitting balance-Leahy Scale: Good     Standing balance support: No upper extremity supported;During functional activity Standing balance-Leahy Scale: Poor Standing balance comment: occasional assist required                             Pertinent Vitals/Pain Pain Assessment: Faces Faces Pain Scale: No hurt    Home Living Family/patient expects to be discharged to:: Private residence Living Arrangements: Spouse/significant other Available Help at Discharge: Family;Available 24 hours/day Type of Home: House Home Access: Stairs to enter Entrance Stairs-Rails: None Entrance Stairs-Number of Steps: 2 Home Layout: Two level;Bed/bath upstairs;Able to live on main level with bedroom/bathroom Home Equipment: Shower seat - built in Additional Comments: Usual bed/bath upstairs however could stay on main level if needbe temporarily.     Prior Function Level of Independence: Independent  Comments: Works as a Designer, fashion/clothing and middle school     Journalist, newspaper        Extremity/Trunk Assessment   Upper Extremity Assessment Upper Extremity Assessment: Overall WFL for tasks assessed    Lower Extremity Assessment Lower Extremity Assessment: Overall WFL for tasks assessed (strength 5/5 bilat. Pt denies N/T)    Cervical / Trunk Assessment Cervical / Trunk Assessment: Other exceptions Cervical / Trunk Exceptions: Forward head posture/rounded shoulders  Communication   Communication: No difficulties  Cognition Arousal/Alertness: Awake/alert Behavior During Therapy: WFL for tasks assessed/performed Overall Cognitive Status: Within Functional Limits for tasks assessed                                        General Comments      Exercises     Assessment/Plan    PT Assessment Patient needs continued PT services  PT Problem List Decreased strength;Decreased range of motion;Decreased activity tolerance;Decreased balance;Decreased mobility;Decreased knowledge of use of DME;Decreased safety awareness;Decreased knowledge of precautions       PT Treatment Interventions DME instruction;Gait training;Stair training;Functional mobility training;Therapeutic activities;Therapeutic exercise;Neuromuscular re-education;Patient/family education    PT Goals (Current goals can be found in the Care Plan section)  Acute Rehab PT Goals Patient Stated Goal: Return home and be independent PT Goal Formulation: With patient/family Time For Goal Achievement: 01/18/17 Potential to Achieve Goals: Good    Frequency Min 3X/week   Barriers to discharge        Co-evaluation               AM-PAC PT "6 Clicks" Daily Activity  Outcome Measure Difficulty turning over in bed (including adjusting bedclothes, sheets and blankets)?: None Difficulty moving from lying on back to sitting on the side of the bed? : None Difficulty sitting down on and standing up from a chair with arms (e.g.,  wheelchair, bedside commode, etc,.)?: A Little Help needed moving to and from a bed to chair (including a wheelchair)?: A Little Help needed walking in hospital room?: A Little Help needed climbing 3-5 steps with a railing? : A Little 6 Click Score: 20    End of Session Equipment Utilized During Treatment: Gait belt Activity Tolerance: Patient limited by fatigue Patient left: in bed;with call bell/phone within reach;with family/visitor present Nurse Communication: Mobility status PT Visit Diagnosis: Unsteadiness on feet (R26.81);Difficulty in walking, not elsewhere classified (R26.2)    Time: 1350-1406 PT Time Calculation (min) (ACUTE ONLY): 16 min   Charges:   PT Evaluation $PT Eval Moderate Complexity: 1 Procedure     PT G Codes:        Rolinda Roan, PT, DPT Acute Rehabilitation Services Pager: [phone]   Thelma Comp 01/11/2017, 2:57 PM

## 2017-01-12 LAB — COMPREHENSIVE METABOLIC PANEL
ALT: 147 U/L — ABNORMAL HIGH (ref 14–54)
ANION GAP: 8 (ref 5–15)
AST: 109 U/L — AB (ref 15–41)
Albumin: 2.6 g/dL — ABNORMAL LOW (ref 3.5–5.0)
Alkaline Phosphatase: 183 U/L — ABNORMAL HIGH (ref 38–126)
BILIRUBIN TOTAL: 0.5 mg/dL (ref 0.3–1.2)
BUN: 6 mg/dL (ref 6–20)
CO2: 25 mmol/L (ref 22–32)
Calcium: 8.5 mg/dL — ABNORMAL LOW (ref 8.9–10.3)
Chloride: 108 mmol/L (ref 101–111)
Creatinine, Ser: 0.62 mg/dL (ref 0.44–1.00)
GFR calc Af Amer: >60 mL/min (ref >60)
Glucose, Bld: 89 mg/dL (ref 65–99)
POTASSIUM: 3.9 mmol/L (ref 3.5–5.1)
Sodium: 141 mmol/L (ref 135–145)
TOTAL PROTEIN: 5.4 g/dL — AB (ref 6.5–8.1)

## 2017-01-12 LAB — CBC WITH DIFFERENTIAL/PLATELET
BASOS ABS: 0 10*3/uL (ref 0.0–0.1)
BASOS PCT: 1 %
EOS ABS: 0.1 10*3/uL (ref 0.0–0.7)
EOS PCT: 1 %
HCT: 29.8 % — ABNORMAL LOW (ref 36.0–46.0)
Hemoglobin: 10.1 g/dL — ABNORMAL LOW (ref 12.0–15.0)
LYMPHS PCT: 66 %
Lymphs Abs: 3.5 10*3/uL (ref 0.7–4.0)
MCH: 32.1 pg (ref 26.0–34.0)
MCHC: 33.9 g/dL (ref 30.0–36.0)
MCV: 94.6 fL (ref 78.0–100.0)
Monocytes Absolute: 0.3 10*3/uL (ref 0.1–1.0)
Monocytes Relative: 6 %
Neutro Abs: 1.4 10*3/uL — ABNORMAL LOW (ref 1.7–7.7)
Neutrophils Relative %: 26 %
PLATELETS: 126 10*3/uL — AB (ref 150–400)
RBC: 3.15 MIL/uL — AB (ref 3.87–5.11)
RDW: 12.3 % (ref 11.5–15.5)
WBC: 5.3 10*3/uL (ref 4.0–10.5)

## 2017-01-12 MED ORDER — DOXYCYCLINE HYCLATE 100 MG PO TABS: 100.0000 mg | ORAL_TABLET | Freq: Two times a day (BID) | ORAL | 0 refills | Status: DC

## 2017-01-12 NOTE — Progress Notes (Signed)
Physical Therapy Treatment Patient Details Name: Brenda Foster MRN: [id number] DOB: [birth year] Today's Date: 01/12/2017    History of Present Illness Ptis a 51 y.o.femalewith medical history significant of anxiety/depression, migraines. Patient's initial presenting complaint was for fever and SOB. Patient states that her shortness of breath started approximately 4 weeks ago after developing a UTI. Pt admitted with fever of unknown origin and metabolic acidosis.    PT Comments    Upon arrival pt seated in recliner chair with family present. Pt agreeable to mobilize with PT. Attempted to ambulate with straight cane; however pt more comfortable with 1 person hand held assist. Pt also able to navigate a flight of stairs today to practice going between floors in her home. Pt hesitant with stairs and continues to have instability when ambulating. Pt will have 24 hour assistance at home from family for safety. Will follow acutely to continue mobilization and balance training for safe d/c and return to PLOF.    Follow Up Recommendations  No PT follow up;Supervision for mobility/OOB     Equipment Recommendations  Cane    Recommendations for Other Services       Precautions / Restrictions Precautions Precautions: Fall Restrictions Weight Bearing Restrictions: No    Mobility  Bed Mobility               General bed mobility comments: Pt seated in chair upon arrival   Transfers Overall transfer level: Needs assistance   Transfers: Sit to/from Stand Sit to Stand: Min guard         General transfer comment: Min guard for safety. Pt did not require VCs for sequencing or hand placement   Ambulation/Gait Ambulation/Gait assistance: Min assist;Min guard Ambulation Distance (Feet): 270 Feet Assistive device: 1 person hand held assist;Straight cane Gait Pattern/deviations: Step-through pattern;Decreased stride length;Trunk flexed Gait velocity: Decreased Gait velocity  interpretation: Below normal speed for age/gender General Gait Details: Min guard for safety while using straight cane. Pt was able to ambulate with increased velocity and confidence with 1 person hand held assist (min A; however pt not reliant). Pt continues to have instability with ambulation.    Stairs Stairs: Yes   Stair Management: No rails;Step to pattern;With cane Number of Stairs: 12 General stair comments: Min A with 1 person hand held assist and use of straight cane. Pt with increased hesitation and increased time for descent.   Wheelchair Mobility    Modified Rankin (Stroke Patients Only)       Balance Overall balance assessment: Needs assistance Sitting-balance support: Feet supported;No upper extremity supported Sitting balance-Leahy Scale: Good     Standing balance support: Single extremity supported;During functional activity Standing balance-Leahy Scale: Poor Standing balance comment: pt requires at least 1 person hand held assist for stability during ambulation                             Cognition Arousal/Alertness: Awake/alert Behavior During Therapy: WFL for tasks assessed/performed Overall Cognitive Status: Within Functional Limits for tasks assessed                                        Exercises General Exercises - Lower Extremity Hip Flexion/Marching: Standing;Both;10 reps;AROM Heel Raises: Standing;Both;10 reps;AROM    General Comments        Pertinent Vitals/Pain Pain Assessment: No/denies pain    Home Living  Prior Function            PT Goals (current goals can now be found in the care plan section) Acute Rehab PT Goals Patient Stated Goal: Return home and be independent PT Goal Formulation: With patient/family Time For Goal Achievement: 01/26/17 Potential to Achieve Goals: Good Progress towards PT goals: Progressing toward goals    Frequency    Min 3X/week       PT Plan Current plan remains appropriate    Co-evaluation              AM-PAC PT "6 Clicks" Daily Activity  Outcome Measure  Difficulty turning over in bed (including adjusting bedclothes, sheets and blankets)?: None Difficulty moving from lying on back to sitting on the side of the bed? : None Difficulty sitting down on and standing up from a chair with arms (e.g., wheelchair, bedside commode, etc,.)?: A Little Help needed moving to and from a bed to chair (including a wheelchair)?: A Little Help needed walking in hospital room?: A Little Help needed climbing 3-5 steps with a railing? : A Little 6 Click Score: 20    End of Session Equipment Utilized During Treatment: Gait belt Activity Tolerance: Patient tolerated treatment well Patient left: in chair;with call bell/phone within reach;with family/visitor present Nurse Communication: Mobility status PT Visit Diagnosis: Unsteadiness on feet (R26.81);Other abnormalities of gait and mobility (R26.89);Difficulty in walking, not elsewhere classified (R26.2)     Time: 9166-0600 PT Time Calculation (min) (ACUTE ONLY): 25 min  Charges:                       G Codes:       Elberta Leatherwood, SPT Acute Rehab [place] 01/12/2017, 11:28 AM

## 2017-01-12 NOTE — Discharge Summary (Signed)
Physician Discharge Summary  Brenda Foster VAN:[id number] DOB: 12-28-1965 DOA: 01/06/2017  PCP: Howard Pouch A, DO  Admit date: 01/06/2017 Discharge date: 01/12/2017  Time spent: 35 minutes  Recommendations for Outpatient Follow-up:  1. Follow-up on SPEP performed on 01/09/2017-very unlikely to be positive--will probably be available 7/26 AM 2. Complete seventh day of doxycycline 7/25 3. Get chem 12 and CBC in 3-4 days 4. Please refer to psychiatry for bipolar 5. Will arrange for walker on discharge  Discharge Diagnoses:  Active Problems:   Situational anxiety   Fatigue   Sepsis ([place])   Pyelonephritis   Hyperglycemia   Hypokalemia   Discharge Condition: Improved  Diet recommendation: Regular  Filed Weights   01/06/17 0835  Weight: 71.7 kg (158 lb)    History of present illness:  51 y/o ? Bipolar migraines Recent Rx for UTI-Keflex for e coli completed Rx Has had also a prolonged rhinitis Recent travel [place] Has had HA and weakness as main presenting c/o  Admitted with presumed UTI cxr neg No meningismus  Progressed to have myalgias as well as spiking temp despite rocephin 7/20--broadened back abx to Vanc Zosyn Discussed case with Dr. Linus Salmons of ID 7/19 and changed Abx as below Consulted Heme  Felt finally to be a tick borne illness and patient made a full recovery  Hospital Course:   Fever of unknown origin--broad differential --final diagnosis was likely a tickborne illness Met acidosis presumably from SIRS--SHE IS NOT SEPTIC--metabolic acidosis resolved 7/22 Etiology unknown             Fever seems to have stopped and patient seems better 7/22             Discontinued vanc 7/19 -started doxycycline  7/19 under direction of Dr. Linus Salmons ID                         Switched to PO doxy 7/23--Zosyn discontinued 7/23             BC 7/18 ngtd, urine culture also negative             BC rpt 7/20 pending             White count 0.2-0.9-1.7-->3.8-->4--->5.3 on day  of discharge  Idiopathic bone marrow suppression-? Keflex versus serum sickness-improving significantly now Acute thrombocytopenia             platelets less than 5 down from 60 7/19--resolving  s/p 1 unit PLT 7/20             PLT less than 5 on 7/19 --> 63--->80--->126 on discharge  - discontinued neutropenic precautions 7/22 Elevated transaminases, elevated alkaline phosphatase--probably secondary to viral illness note reversal of.alb/total protein ration--  SPEP ordered and pending still at the time of discharge is--myeloma not felt to be etiology of discharge however needs to be followed up closely. Ferritin elevated, LDH elevated--could be non specific              ECHO TTE 7/23 [-] concerns of endocarditis             ? Drug abuse--drug screen is pending             Await  heavy metal screen, spep             ESR is 6             HIV negative  Ehrlichia neg             RMSF[-]             hepatitis panel neg             HIT panel neg             Ds dna .[-]               Cholecystitis             CT scan abdomen pelvis ? Cholecystitis unlikely as HIDA neg             Appreciate Gen surg input-they have signed off  Elevated LFTs could be secondary to viral illness and slowly is resolving  ? Interstitial nephritis             Unclear this is an actual diagnosis              Stop pyridium 7/21   Hypertension with complication of hypotension             We have held propanolol 10 twice a day             Fluids NS 125 cc/h-->50 cc/h, discontinue IV fluids 7/23  Hypokalemia 3.0 on 7/22             Replaced with oral K Dur 40 daily  Recheck as outpatient  Bipolar             Continue Prozac 30, Ativan  used in the hospital and will need to be followed closely by her primary physician I would recommend psychiatry referral as an outpatient   Procedures: HIDA scan   Discharge Exam: Vitals:   01/11/17 2136 01/12/17 0614  BP: 131/80 134/85  Pulse: 61 60   Resp: 18 18  Temp: 98.2 F (36.8 C) 98.4 F (36.9 C)    General: Alert much improved pleasant oriented S1-S2 no murmur rub or gallop Abdomen soft slightly tender in epigastrium No lower extremity edema Neurologically intact although little tired Chest is clear   Discharge Instructions    Current Discharge Medication List    START taking these medications   Details  doxycycline (VIBRA-TABS) 100 MG tablet Take 1 tablet (100 mg total) by mouth every 12 (twelve) hours. Qty: 2 tablet, Refills: 0      CONTINUE these medications which have NOT CHANGED   Details  aspirin 325 MG tablet Take 325 mg by mouth every 6 (six) hours as needed for mild pain.    FLUoxetine (PROZAC) 20 MG tablet Take 30 mg by mouth daily.    Multiple Vitamin (MULTIVITAMIN) tablet Take 1 tablet by mouth daily.    propranolol (INDERAL) 10 MG tablet Take 10 mg by mouth 2 (two) times daily.       STOP taking these medications     ibuprofen (ADVIL,MOTRIN) 200 MG tablet      meloxicam (MOBIC) 15 MG tablet      Probiotic Product (PROBIOTIC PO)      [id number]-DM-APAP (NIGHTTIME COLD/COUGH PO)      cephALEXin (KEFLEX) 500 MG capsule      phenazopyridine (PYRIDIUM) 100 MG tablet        Allergies  Allergen Reactions  . Heparin     Do not give per MD  . Sulfa Antibiotics Hives      The results of significant diagnostics from this hospitalization (including imaging, microbiology, ancillary and laboratory) are listed below for reference.  Significant Diagnostic Studies: Dg Chest 2 View  Result Date: 01/06/2017 CLINICAL DATA:  Shortness of breath since Monday EXAM: CHEST  2 VIEW COMPARISON:  12/07/2016 FINDINGS: EKG leads create artifact over the chest. Normal heart size and mediastinal contours. No acute infiltrate or edema. No effusion or pneumothorax. No acute osseous findings. IMPRESSION: No evidence of active disease. Electronically Signed   By: Monte Fantasia M.D.   On: 01/06/2017  09:22   Ct Head Wo Contrast  Result Date: 01/07/2017 CLINICAL DATA:  Fever of unknown origin.  Headaches. EXAM: CT HEAD WITHOUT CONTRAST TECHNIQUE: Contiguous axial images were obtained from the base of the skull through the vertex without intravenous contrast. COMPARISON:  None. FINDINGS: Brain: Ventricles are normal in size and configuration. All areas of the brain demonstrate normal gray-white matter attenuation. There is no mass, hemorrhage, edema or other evidence of acute parenchymal abnormality. No extra-axial hemorrhage. Vascular: No hyperdense vessel or unexpected calcification. Skull: Normal. Negative for fracture or focal lesion. Sinuses/Orbits: No acute finding. Other: None. IMPRESSION: Negative head CT.  No intracranial mass, hemorrhage or edema. Electronically Signed   By: Franki Cabot M.D.   On: 01/07/2017 18:06   Ct Angio Chest Pe W And/or Wo Contrast  Result Date: 01/06/2017 CLINICAL DATA:  Dizziness and shortness of breath for 3 days. EXAM: CT ANGIOGRAPHY CHEST WITH CONTRAST TECHNIQUE: Multidetector CT imaging of the chest was performed using the standard protocol during bolus administration of intravenous contrast. Multiplanar CT image reconstructions and MIPs were obtained to evaluate the vascular anatomy. CONTRAST:  100 cc Isovue 370 intravenous COMPARISON:  None available FINDINGS: Cardiovascular: Satisfactory opacification of the pulmonary arteries to the segmental level. Intermittent motion and streak artifact with no evidence of pulmonary embolism. Normal heart size. No pericardial effusion. Mediastinum/Nodes: Negative for mass or adenopathy Lungs/Pleura: There is no edema, consolidation, effusion, or pneumothorax. Upper Abdomen: Negative Musculoskeletal: No acute or aggressive finding Review of the MIP images confirms the above findings. IMPRESSION: No evidence of pulmonary embolism.  No explanation for symptoms. Electronically Signed   By: Monte Fantasia M.D.   On: 01/06/2017  11:13   Ct Abdomen Pelvis W Contrast  Result Date: 01/08/2017 CLINICAL DATA:  Fever, thrombocytopenia and leukocytosis. EXAM: CT ABDOMEN AND PELVIS WITH CONTRAST TECHNIQUE: Multidetector CT imaging of the abdomen and pelvis was performed using the standard protocol following bolus administration of intravenous contrast. CONTRAST:  < 75 cc ISOVUE-300 IOPAMIDOL (ISOVUE-300) INJECTION 61% COMPARISON:  Chest CT 01/06/2017 FINDINGS: Lower chest: Dependent subpleural bibasilar atelectasis but no infiltrates or pulmonary lesions. Very small pleural effusions. Heart is normal in size. No pericardial effusion. Small hiatal hernia. Hepatobiliary: Suspect gallbladder wall thickening/edema or possible small amount of pericholecystic fluid. No obvious gallstones. Right upper quadrant ultrasound may be helpful for further evaluation and to exclude cholecystitis. No focal hepatic lesions or intrahepatic biliary dilatation. The portal and splenic veins are patent. Pancreas: No mass, inflammation or ductal dilatation. Spleen: Within normal limits in size.  No focal lesions. Adrenals/Urinary Tract: The adrenal glands and kidneys are unremarkable. A very small midpole left renal calculus is noted. No obstructing ureteral calculi or bladder calculi. Small bilateral renal cysts. Stomach/Bowel: The stomach, duodenum, small bowel and colon are unremarkable. No acute inflammatory changes, mass lesions or obstructive findings. The terminal ileum is normal. The appendix is surgically absent. Vascular/Lymphatic: The aorta and branch vessels are normal. The major venous structures are patent. No mesenteric or retroperitoneal mass or adenopathy. Reproductive: Retroverted uterus.  The ovaries are normal.  Other: No pelvic mass or adenopathy. No free pelvic fluid collections. No inguinal mass or adenopathy. No abdominal wall hernia or subcutaneous lesions. Musculoskeletal: No significant bony findings. A few small scattered sclerotic bone  lesions are likely benign bone islands. IMPRESSION: 1. Suspect gallbladder wall thickening or pericholecystic fluid. Could not exclude cholecystitis. Recommend followup right upper quadrant ultrasound examination and correlation with any right upper quadrant abdominal pain. No biliary dilatation. 2. Bibasilar atelectasis and very small pleural effusions. 3. Small midpole left renal calculus but no obstructing ureteral calculi or bladder calculi. Electronically Signed   By: Marijo Sanes M.D.   On: 01/08/2017 12:11   Nm Hepato W/eject Fract  Result Date: 01/09/2017 CLINICAL DATA:  Abnormal CT exam with minimal gallbladder wall thickening and questionable pericholecystic fluid concerning for acute cholecystitis EXAM: NUCLEAR MEDICINE HEPATOBILIARY IMAGING WITH GALLBLADDER EF TECHNIQUE: Sequential images of the abdomen were obtained out to 60 minutes following intravenous administration of radiopharmaceutical. After oral ingestion of Ensure, gallbladder ejection fraction was determined. At 60 min, normal ejection fraction is greater than 33%. RADIOPHARMACEUTICALS:  5.44 mCi Tc-39m Choletec IV COMPARISON:  CT abdomen and pelvis 01/08/2017 FINDINGS: Normal tracer extraction from bloodstream indicating normal hepatocellular function. Normal excretion of tracer into biliary tree. Gallbladder visualized by 20 min. Small bowel visualized at 34 min. No hepatic retention of tracer. Subjectively normal emptying of tracer from gallbladder following fatty meal stimulation. Calculated gallbladder ejection fraction is 70%, normal. Patient reported no symptoms following Ensure ingestion. Normal gallbladder ejection fraction following Ensure ingestion is greater than 33% at 1 hour. IMPRESSION: Normal exam. Patent biliary tree with normal gallbladder ejection fraction of 70% following fatty meal stimulation. Electronically Signed   By: MLavonia DanaM.D.   On: 01/09/2017 11:13    Microbiology: Recent Results (from the past 240  hour(s))  Blood Culture (routine x 2)     Status: None   Collection Time: 01/06/17  9:30 AM  Result Value Ref Range Status   Specimen Description BLOOD RIGHT WRIST  Final   Special Requests   Final    BOTTLES DRAWN AEROBIC AND ANAEROBIC Blood Culture adequate volume   Culture NO GROWTH 5 DAYS  Final   Report Status 01/11/2017 FINAL  Final  Blood Culture (routine x 2)     Status: None   Collection Time: 01/06/17  9:30 AM  Result Value Ref Range Status   Specimen Description BLOOD LEFT ANTECUBITAL  Final   Special Requests   Final    BOTTLES DRAWN AEROBIC AND ANAEROBIC Blood Culture adequate volume   Culture NO GROWTH 5 DAYS  Final   Report Status 01/11/2017 FINAL  Final  Urine Culture     Status: Abnormal   Collection Time: 01/06/17 11:17 AM  Result Value Ref Range Status   Specimen Description URINE, RANDOM  Final   Special Requests NONE  Final   Culture <10,000 COLONIES/mL INSIGNIFICANT GROWTH (A)  Final   Report Status 01/07/2017 FINAL  Final  Culture, blood (routine x 2)     Status: None (Preliminary result)   Collection Time: 01/08/17  3:16 PM  Result Value Ref Range Status   Specimen Description BLOOD LEFT HAND  Final   Special Requests AEROBIC BOTTLE ONLY Blood Culture adequate volume  Final   Culture NO GROWTH 3 DAYS  Final   Report Status PENDING  Incomplete  Culture, blood (routine x 2)     Status: None (Preliminary result)   Collection Time: 01/08/17  3:20 PM  Result  Value Ref Range Status   Specimen Description BLOOD LEFT HAND  Final   Special Requests AEROBIC BOTTLE ONLY Blood Culture adequate volume  Final   Culture NO GROWTH 3 DAYS  Final   Report Status PENDING  Incomplete     Labs: Basic Metabolic Panel:  Recent Labs Lab 01/08/17 0430 01/09/17 0341 01/10/17 0448 01/11/17 0554 01/12/17 0614  NA 138 136 141 139 141  K 3.7 3.2* 3.0* 3.8 3.9  CL 113* 110 111 111 108  CO2 17* 19* '24 24 25  ' GLUCOSE 127* 92 104* 84 89  BUN 5* <5* <5* 5* 6  CREATININE  0.73 0.74 0.70 0.60 0.62  CALCIUM 7.7* 7.7* 8.1* 8.1* 8.5*   Liver Function Tests:  Recent Labs Lab 01/08/17 0430 01/09/17 0341 01/10/17 0448 01/11/17 0554 01/12/17 0614  AST 261* 168* 107* 124* 109*  ALT 172* 153* 126* 147* 147*  ALKPHOS 166* 217* 189* 178* 183*  BILITOT 0.7 0.6 0.7 0.6 0.5  PROT 4.5* 5.1* 4.6* 4.9* 5.4*  ALBUMIN 2.6* 2.7* 2.4* 2.3* 2.6*   No results for input(s): LIPASE, AMYLASE in the last 168 hours. No results for input(s): AMMONIA in the last 168 hours. CBC:  Recent Labs Lab 01/08/17 1151 01/09/17 0341 01/10/17 0448 01/11/17 0554 01/11/17 1325 01/12/17 0614  WBC 0.9* 1.7* 3.8* 4.4 4.1 5.3  NEUTROABS 0.3* 0.6* 1.3*  --  0.8* 1.4*  HGB 10.5* 10.8* 9.9* 9.4* 9.8* 10.1*  HCT 31.0* 31.8* 28.3* 27.3* 28.8* 29.8*  MCV 96.0 95.2 94.6 94.5 94.7 94.6  PLT 36* 45* 61* 80* 87* 126*   Cardiac Enzymes: No results for input(s): CKTOTAL, CKMB, CKMBINDEX, TROPONINI in the last 168 hours. BNP: BNP (last 3 results) No results for input(s): BNP in the last 8760 hours.  ProBNP (last 3 results) No results for input(s): PROBNP in the last 8760 hours.  CBG:  Recent Labs Lab 01/06/17 0934  GLUCAP 97       Signed:  Nita Sells MD   [facility] 01/12/2017, 9:52 AM

## 2017-01-12 NOTE — Progress Notes (Signed)
Brenda Foster to be D/C'd  per MD order. Discussed with the patient and all questions fully answered.  VSS, Skin clean, dry and intact without evidence of skin break down, no evidence of skin tears noted.  IV catheter discontinued intact. Site without signs and symptoms of complications. Dressing and pressure applied.  An After Visit Summary was printed and given to the patient. Patient received prescription.  D/c education completed with patient/family including follow up instructions, medication list, d/c activities limitations if indicated, with other d/c instructions as indicated by MD - patient able to verbalize understanding, all questions fully answered.   Patient instructed to return to ED, call 911, or call MD for any changes in condition.   Patient to be escorted via [place], and D/C home via private auto.  Verified patient got cane to take home with her delivered to room.

## 2017-01-13 LAB — PROTEIN ELECTROPHORESIS, SERUM
A/G Ratio: 1.2
ALBUMIN ELP: 2.6 g/dL
ALPHA-1-GLOBULIN: 0.3 g/dL
ALPHA-2-GLOBULIN: 0.5 g/dL
Beta Globulin: 0.7 g/dL
GAMMA GLOBULIN: 0.8 g/dL
Globulin, Total: 2.2 g/dL
TOTAL PROTEIN ELP: 4.8 g/dL — AB (ref 6.0–8.5)

## 2017-01-13 LAB — CULTURE, BLOOD (ROUTINE X 2)
CULTURE: NO GROWTH
Culture: NO GROWTH
SPECIAL REQUESTS: ADEQUATE
Special Requests: ADEQUATE

## 2017-01-15 ENCOUNTER — Encounter: Payer: Self-pay | Admitting: Family Medicine

## 2017-01-15 ENCOUNTER — Ambulatory Visit (INDEPENDENT_AMBULATORY_CARE_PROVIDER_SITE_OTHER): Payer: BC Managed Care – PPO | Admitting: Family Medicine

## 2017-01-15 VITALS — BP 111/75 | HR 59 | Temp 98.9°F | Resp 20 | Wt 159.2 lb

## 2017-01-15 DIAGNOSIS — R945 Abnormal results of liver function studies: Secondary | ICD-10-CM

## 2017-01-15 DIAGNOSIS — W57XXXD Bitten or stung by nonvenomous insect and other nonvenomous arthropods, subsequent encounter: Secondary | ICD-10-CM

## 2017-01-15 DIAGNOSIS — R7989 Other specified abnormal findings of blood chemistry: Secondary | ICD-10-CM

## 2017-01-15 DIAGNOSIS — A419 Sepsis, unspecified organism: Secondary | ICD-10-CM

## 2017-01-15 DIAGNOSIS — Z9289 Personal history of other medical treatment: Secondary | ICD-10-CM

## 2017-01-15 DIAGNOSIS — R350 Frequency of micturition: Secondary | ICD-10-CM

## 2017-01-15 LAB — CBC
HCT: 38.6 % (ref 35.0–45.0)
Hemoglobin: 13 g/dL (ref 11.7–15.5)
MCH: 33.7 pg — AB (ref 27.0–33.0)
MCHC: 33.7 g/dL (ref 32.0–36.0)
MCV: 100 fL (ref 80.0–100.0)
MPV: 10.1 fL (ref 7.5–12.5)
PLATELETS: 335 10*3/uL (ref 140–400)
RBC: 3.86 MIL/uL (ref 3.80–5.10)
RDW: 13.2 % (ref 11.0–15.0)
WBC: 8.2 10*3/uL (ref 3.8–10.8)

## 2017-01-15 LAB — POC URINALSYSI DIPSTICK (AUTOMATED)
BILIRUBIN UA: NEGATIVE
GLUCOSE UA: NEGATIVE
Ketones, UA: NEGATIVE
Leukocytes, UA: NEGATIVE
NITRITE UA: NEGATIVE
Protein, UA: NEGATIVE
RBC UA: NEGATIVE
Spec Grav, UA: 1.02 (ref 1.010–1.025)
UROBILINOGEN UA: 0.2 U/dL
pH, UA: 6.5 (ref 5.0–8.0)

## 2017-01-15 MED ORDER — DOXYCYCLINE HYCLATE 100 MG PO TABS: 100.0000 mg | ORAL_TABLET | Freq: Two times a day (BID) | ORAL | 0 refills | Status: DC

## 2017-01-15 NOTE — Progress Notes (Signed)
Brenda Foster , 1965-10-01, 51 y.o., female MRN: [id number] Patient Care Team    Relationship Specialty Notifications Start End  Ma Hillock, DO PCP - General Family Medicine  05/12/16   Dineen Kid, DO Referring Physician   05/12/16    Comment: psych  Servando Salina, MD Consulting Physician Obstetrics and Gynecology  05/12/16     Chief Complaint  Patient presents with  . Hospitalization Follow-up    sepsis     Subjective:  Brenda Foster  is a 51 y.o. female presents for hospital follow up after recent admission on July 18 for primary diagnosis sepsis. Pt was discharged on July 24 to her home. Patients discharge summary has been reviewed, as well as all labs/image studies obtained during hospitalization.  Patients hospital course: Patient was admitted with presumed urosepsis. She had had a recent urinary tract infection treated appropriately prior to that admission. On admission patient had a lactic acid 2.3, WBC of 2.1, RBC of 3.43, hemoglobin 11.1, hematocrit 33.4, platelets 72. All labs will be a candidate for more significant with 3 mL of 0.2, platelet 5, hemoglobin 9.6, hematocrit 27.5. Patient had significant elevation in alkaline phosphatase 166, AST 261, ALT 172 . LFTs have slowly started declining at discharge. Urinalysis on admission had large leukocytes, 20 ketones, small amount hemoglobin many bacteria to be received too numerous to count. However urine culture was negative. Blood cultures also negative.  Since hospital discharge patient reports she is still mildly weak feeling, but much improved. She is still confused and with the actual diagnosis was from her hospital stay. By notes it would seem they're settling on a "likely tickborne illness ". Patient was evaluated by infectious disease. She had a negative hepatitis panel, negative HIV, negative [place] spotted fever test panel. She was prescribed vancomycin and Zosyn on admission, and switched to  doxycycline in which she completed a seven-day course. She is returned to her nor mole eating habits. She does complain of some mild urinary frequency. She denies fever, chills, nausea or vomit.  Imaging reviewed: Negative chest x-ray, negative chest CTA, negative CT head, CT abdominal pelvis was concerning for potential cholecystitis. Normal HIDA.  Recent Labs Lab 01/11/17 0554 01/11/17 1325 01/12/17 0614  HGB 9.4* 9.8* 10.1*  HCT 27.3* 28.8* 29.8*  WBC 4.4 4.1 5.3  PLT 80* 87* 126*   CMP Latest Ref Rng & Units 01/12/2017 01/11/2017 01/10/2017  Glucose 65 - 99 mg/dL 89 84 104(H)  BUN 6 - 20 mg/dL 6 5(L) <5(L)  Creatinine 0.44 - 1.00 mg/dL 0.62 0.60 0.70  Sodium 135 - 145 mmol/L 141 139 141  Potassium 3.5 - 5.1 mmol/L 3.9 3.8 3.0(L)  Chloride 101 - 111 mmol/L 108 111 111  CO2 22 - 32 mmol/L 25 24 24   Calcium 8.9 - 10.3 mg/dL 8.5(L) 8.1(L) 8.1(L)  Total Protein 6.5 - 8.1 g/dL 5.4(L) 4.9(L) 4.6(L)  Total Bilirubin 0.3 - 1.2 mg/dL 0.5 0.6 0.7  Alkaline Phos 38 - 126 U/L 183(H) 178(H) 189(H)  AST 15 - 41 U/L 109(H) 124(H) 107(H)  ALT 14 - 54 U/L 147(H) 147(H) 126(H)      Dg Chest 2 View  Result Date: 01/06/2017 CLINICAL DATA:  Shortness of breath since Monday EXAM: CHEST  2 VIEW COMPARISON:  12/07/2016 FINDINGS: EKG leads create artifact over the chest. Normal heart size and mediastinal contours. No acute infiltrate or edema. No effusion or pneumothorax. No acute osseous findings. IMPRESSION: No evidence of active disease. Electronically  Signed   By: Monte Fantasia M.D.   On: 01/06/2017 09:22   Ct Head Wo Contrast  Result Date: 01/07/2017 CLINICAL DATA:  Fever of unknown origin.  Headaches. EXAM: CT HEAD WITHOUT CONTRAST TECHNIQUE: Contiguous axial images were obtained from the base of the skull through the vertex without intravenous contrast. COMPARISON:  None. FINDINGS: Brain: Ventricles are normal in size and configuration. All areas of the brain demonstrate normal gray-white  matter attenuation. There is no mass, hemorrhage, edema or other evidence of acute parenchymal abnormality. No extra-axial hemorrhage. Vascular: No hyperdense vessel or unexpected calcification. Skull: Normal. Negative for fracture or focal lesion. Sinuses/Orbits: No acute finding. Other: None. IMPRESSION: Negative head CT.  No intracranial mass, hemorrhage or edema. Electronically Signed   By: Franki Cabot M.D.   On: 01/07/2017 18:06   Ct Angio Chest Pe W And/or Wo Contrast  Result Date: 01/06/2017 CLINICAL DATA:  Dizziness and shortness of breath for 3 days. EXAM: CT ANGIOGRAPHY CHEST WITH CONTRAST TECHNIQUE: Multidetector CT imaging of the chest was performed using the standard protocol during bolus administration of intravenous contrast. Multiplanar CT image reconstructions and MIPs were obtained to evaluate the vascular anatomy. CONTRAST:  100 cc Isovue 370 intravenous COMPARISON:  None available FINDINGS: Cardiovascular: Satisfactory opacification of the pulmonary arteries to the segmental level. Intermittent motion and streak artifact with no evidence of pulmonary embolism. Normal heart size. No pericardial effusion. Mediastinum/Nodes: Negative for mass or adenopathy Lungs/Pleura: There is no edema, consolidation, effusion, or pneumothorax. Upper Abdomen: Negative Musculoskeletal: No acute or aggressive finding Review of the MIP images confirms the above findings. IMPRESSION: No evidence of pulmonary embolism.  No explanation for symptoms. Electronically Signed   By: Monte Fantasia M.D.   On: 01/06/2017 11:13   Ct Abdomen Pelvis W Contrast  Result Date: 01/08/2017 CLINICAL DATA:  Fever, thrombocytopenia and leukocytosis. EXAM: CT ABDOMEN AND PELVIS WITH CONTRAST TECHNIQUE: Multidetector CT imaging of the abdomen and pelvis was performed using the standard protocol following bolus administration of intravenous contrast. CONTRAST:  < 75 cc ISOVUE-300 IOPAMIDOL (ISOVUE-300) INJECTION 61% COMPARISON:   Chest CT 01/06/2017 FINDINGS: Lower chest: Dependent subpleural bibasilar atelectasis but no infiltrates or pulmonary lesions. Very small pleural effusions. Heart is normal in size. No pericardial effusion. Small hiatal hernia. Hepatobiliary: Suspect gallbladder wall thickening/edema or possible small amount of pericholecystic fluid. No obvious gallstones. Right upper quadrant ultrasound may be helpful for further evaluation and to exclude cholecystitis. No focal hepatic lesions or intrahepatic biliary dilatation. The portal and splenic veins are patent. Pancreas: No mass, inflammation or ductal dilatation. Spleen: Within normal limits in size.  No focal lesions. Adrenals/Urinary Tract: The adrenal glands and kidneys are unremarkable. A very small midpole left renal calculus is noted. No obstructing ureteral calculi or bladder calculi. Small bilateral renal cysts. Stomach/Bowel: The stomach, duodenum, small bowel and colon are unremarkable. No acute inflammatory changes, mass lesions or obstructive findings. The terminal ileum is normal. The appendix is surgically absent. Vascular/Lymphatic: The aorta and branch vessels are normal. The major venous structures are patent. No mesenteric or retroperitoneal mass or adenopathy. Reproductive: Retroverted uterus.  The ovaries are normal. Other: No pelvic mass or adenopathy. No free pelvic fluid collections. No inguinal mass or adenopathy. No abdominal wall hernia or subcutaneous lesions. Musculoskeletal: No significant bony findings. A few small scattered sclerotic bone lesions are likely benign bone islands. IMPRESSION: 1. Suspect gallbladder wall thickening or pericholecystic fluid. Could not exclude cholecystitis. Recommend followup right upper quadrant ultrasound examination and  correlation with any right upper quadrant abdominal pain. No biliary dilatation. 2. Bibasilar atelectasis and very small pleural effusions. 3. Small midpole left renal calculus but no  obstructing ureteral calculi or bladder calculi. Electronically Signed   By: Marijo Sanes M.D.   On: 01/08/2017 12:11   Nm Hepato W/eject Fract  Result Date: 01/09/2017 CLINICAL DATA:  Abnormal CT exam with minimal gallbladder wall thickening and questionable pericholecystic fluid concerning for acute cholecystitis EXAM: NUCLEAR MEDICINE HEPATOBILIARY IMAGING WITH GALLBLADDER EF TECHNIQUE: Sequential images of the abdomen were obtained out to 60 minutes following intravenous administration of radiopharmaceutical. After oral ingestion of Ensure, gallbladder ejection fraction was determined. At 60 min, normal ejection fraction is greater than 33%. RADIOPHARMACEUTICALS:  5.44 mCi Tc-18m  Choletec IV COMPARISON:  CT abdomen and pelvis 01/08/2017 FINDINGS: Normal tracer extraction from bloodstream indicating normal hepatocellular function. Normal excretion of tracer into biliary tree. Gallbladder visualized by 20 min. Small bowel visualized at 34 min. No hepatic retention of tracer. Subjectively normal emptying of tracer from gallbladder following fatty meal stimulation. Calculated gallbladder ejection fraction is 70%, normal. Patient reported no symptoms following Ensure ingestion. Normal gallbladder ejection fraction following Ensure ingestion is greater than 33% at 1 hour. IMPRESSION: Normal exam. Patent biliary tree with normal gallbladder ejection fraction of 70% following fatty meal stimulation. Electronically Signed   By: Lavonia Dana M.D.   On: 01/09/2017 11:13     Depression screen PHQ 2/9 05/12/2016  Decreased Interest 0  Down, Depressed, Hopeless 0  PHQ - 2 Score 0    Allergies  Allergen Reactions  . Heparin     Do not give per MD  . Sulfa Antibiotics Hives   Social History  Substance Use Topics  . Smoking status: Never Smoker  . Smokeless tobacco: Never Used  . Alcohol use 2.4 - 3.0 oz/week    4 - 5 Glasses of wine per week   Past Medical History:  Diagnosis Date  . Anxiety   .  Depression    Mild n/ infertility  . History of miscarriage   . Infertility   . Migraines   . Sepsis ([place]) 12/2016  . Urinary tract bacterial infections    2x  . Urine incontinence    Mild   Past Surgical History:  Procedure Laterality Date  . APPENDECTOMY  1980  . BREAST SURGERY  1986   Reduction  . CESAREAN SECTION  2003  . DILATION AND CURETTAGE OF UTERUS  1999-2002  . HYSTEROSCOPY  2001  . LAPAROSCOPY  2001  . TUBAL LIGATION  2003   Family History  Problem Relation Age of Onset  . Cancer Father        prostate  . Heart disease Father 10       MI, premature  . Stroke Father        Mind  . Stroke Maternal Grandfather   . Anemia Mother   . Autism spectrum disorder Daughter   . Stroke Maternal Grandmother    Allergies as of 01/15/2017      Reactions   Heparin    Do not give per MD   Sulfa Antibiotics Hives      Medication List       Accurate as of 01/15/17  3:12 PM. Always use your most recent med list.          aspirin 325 MG tablet Take 325 mg by mouth every 6 (six) hours as needed for mild pain.   FLUoxetine 20 MG tablet  Commonly known as:  PROZAC Take 30 mg by mouth daily.   multivitamin tablet Take 1 tablet by mouth daily.   propranolol 10 MG tablet Commonly known as:  INDERAL Take 10 mg by mouth 2 (two) times daily.       All past medical history, surgical history, allergies, family history, immunizations and medications were updated in the EMR today and reviewed under the history and medication portions of their EMR.      ROS: Negative, with the exception of above mentioned in HPI   Objective:  BP 111/75 (BP Location: Left Arm, Patient Position: Sitting, Cuff Size: Normal)   Pulse (!) 59   Temp 98.9 F (37.2 C)   Resp 20   Wt 159 lb 4 oz (72.2 kg)   SpO2 100%   BMI 25.32 kg/m  Body mass index is 25.32 kg/m. Gen: Afebrile. No acute distress. Nontoxic in appearance, well developed, well nourished. Pale. HENT: AT. [place].  MMM, no  oral lesions.  Eyes:Pupils Equal Round Reactive to light, Extraocular movements intact,  Conjunctiva without redness, discharge or icterus. Neck/lymp/endocrine: Supple, no lymphadenopathy CV: RRR no murmur, no edema Chest: CTAB, no wheeze or crackles. Good air movement, normal resp effort.  Abd: Soft. Flat. NTND. BS present. No Masses palpated. No rebound or guarding.  Skin: No rashes, purpura or petechiae.  Neuro: Walking with a cane at times, guarded gait. She is able to get up and down off the bed without assistance. PERLA. EOMi. Alert. Oriented x3  Psych: Normal affect, dress and demeanor. Normal speech. Normal thought content and judgment.  Results for orders placed or performed in visit on 01/15/17 (from the past 24 hour(s))  POCT Urinalysis Dipstick (Automated)     Status: None   Collection Time: 01/15/17  3:39 PM  Result Value Ref Range   Color, UA yellow    Clarity, UA clear    Glucose, UA negative    Bilirubin, UA negative    Ketones, UA negative    Spec Grav, UA 1.020 1.010 - 1.025   Blood, UA negative    pH, UA 6.5 5.0 - 8.0   Protein, UA negative    Urobilinogen, UA 0.2 0.2 or 1.0 E.U./dL   Nitrite, UA negative    Leukocytes, UA Negative Negative     Assessment/Plan: Brenda Foster is a 51 y.o. female present for OV for Hospital discharge follow up Tick bite, subsequent encounter/elevated LFTs - Patient still with unclear etiology of her hospitalization/symptoms. Consensus states that they feel it was likely secondary to a tickborne illness. - For completeness will complete a Lyme panel today. Will extend doxycycline for at least another 7 days, potentially 14 depending upon lab results (potential total of 14-21 days). Will complete 21 days if Lyme is positive. - CBC - COMPLETE METABOLIC PANEL WITH GFR - Lyme Ab/Western Blot Reflex - doxycycline (VIBRA-TABS) 100 MG tablet; Take 1 tablet (100 mg total) by mouth 2 (two) times daily.  Dispense: 28 tablet; Refill:  0  Urinary frequency - Urine today is completely normal. - POCT Urinalysis Dipstick (Automated) - SPEP normal with no M-Spike,  with exception of decreased albumin.  Elevated LFTs - Possibly secondary to tickborne illness versus viral/CMV etiology. - CMV IgM - Follow-up dependent upon repeat labs, likely will want  close follow-up within 4 weeks to ensure blood counts are returning to her baseline.   Reviewed expectations re: course of current medical issues.  Discussed self-management of symptoms.  Outlined signs and symptoms indicating  need for more acute intervention.  Patient verbalized understanding and all questions were answered.  Patient received an After-Visit Summary.  Any changes in medications were reviewed and patient was provided with updated med list with their AVS.      No orders of the defined types were placed in this encounter.    Note is dictated utilizing voice recognition software. Although note has been proof read prior to signing, occasional typographical errors still can be missed. If any questions arise, please do not hesitate to call for verification.   electronically signed by:  Howard Pouch, DO  [place]

## 2017-01-15 NOTE — Patient Instructions (Addendum)
I called the doxycycline for another 2 weeks.  Rest, hydrate. We will call you with labs once available.  Your urine is normal.

## 2017-01-16 LAB — COMPLETE METABOLIC PANEL WITH GFR
ALK PHOS: 193 U/L — AB (ref 33–130)
ALT: 100 U/L — ABNORMAL HIGH (ref 6–29)
AST: 39 U/L — ABNORMAL HIGH (ref 10–35)
Albumin: 3.9 g/dL (ref 3.6–5.1)
BUN: 13 mg/dL (ref 7–25)
CO2: 19 mmol/L — AB (ref 20–31)
Calcium: 9.1 mg/dL (ref 8.6–10.4)
Chloride: 104 mmol/L (ref 98–110)
Creat: 0.74 mg/dL (ref 0.50–1.05)
GFR, Est African American: >89 mL/min (ref >=60)
GLUCOSE: 82 mg/dL (ref 65–99)
POTASSIUM: 4.4 mmol/L (ref 3.5–5.3)
SODIUM: 139 mmol/L (ref 135–146)
Total Bilirubin: 0.5 mg/dL (ref 0.2–1.2)
Total Protein: 6.8 g/dL (ref 6.1–8.1)

## 2017-01-18 LAB — LYME AB/WESTERN BLOT REFLEX: B burgdorferi Ab IgG+IgM: <0.9 Index (ref <0.90)

## 2017-01-19 LAB — CMV IGM: CMV IgM: <30 AU/mL (ref <30.00)

## 2017-01-25 ENCOUNTER — Other Ambulatory Visit: Payer: Self-pay | Admitting: Oncology

## 2017-01-25 ENCOUNTER — Telehealth: Payer: Self-pay | Admitting: Family Medicine

## 2017-01-25 LAB — DRUG SCREEN 10 W/CONF, SERUM
AMPHETAMINES, IA: NEGATIVE ng/mL
Barbiturates, IA: NEGATIVE ug/mL
Benzodiazepines, IA: POSITIVE ng/mL
COCAINE & METABOLITE, IA: NEGATIVE ng/mL
METHADONE, IA: NEGATIVE ng/mL
OPIATES, IA: NEGATIVE ng/mL
OXYCODONES, IA: NEGATIVE ng/mL
PROPOXYPHENE, IA: NEGATIVE ng/mL
Phencyclidine, IA: NEGATIVE ng/mL
THC(Marijuana) Metabolite, IA: NEGATIVE ng/mL

## 2017-01-25 LAB — BENZODIAZEPINES,MS,WB/SP RFX
7-Aminoclonazepam: NEGATIVE ng/mL
ALPRAZOLAM: NEGATIVE ng/mL
BENZODIAZEPINES CONFIRM: POSITIVE
CHLORDIAZEPOXIDE: NEGATIVE ng/mL
CLONAZEPAM: NEGATIVE ng/mL
DESALKYLFLURAZEPAM: NEGATIVE ng/mL
DESMETHYLCHLORDIAZEPOXIDE: NEGATIVE ng/mL
DESMETHYLDIAZEPAM: NEGATIVE ng/mL
Diazepam: NEGATIVE ng/mL
FLURAZEPAM: NEGATIVE ng/mL
LORAZEPAM: 16.8 ng/mL
MIDAZOLAM: NEGATIVE ng/mL
Oxazepam: NEGATIVE ng/mL
TRIAZOLAM: NEGATIVE ng/mL
Temazepam: NEGATIVE ng/mL

## 2017-01-25 NOTE — Progress Notes (Unsigned)
The SPEP showed no M spike. The drug screen was negative except for benzodiazepines which we were providing the patient's during the hospitalization.  The patient's transient cytopenias were most likely related either to the infection she had prior to hospitalization or to its treatment. However a definitive cause cannot be ascertained.

## 2017-01-25 NOTE — Telephone Encounter (Signed)
Patient notified and verbalized understanding. Patient stated that she would call back to schedule appointment when she had her calendar.

## 2017-01-25 NOTE — Telephone Encounter (Signed)
Please call pt: - all labs are improving, not quite normal yet, but improved.  - Lyme and CMV titers are normal, and do not indicate infection was caused by those sources.  - would want to follow in another 4-6 weeks to make certain continue resolution of symptoms and labs return to normal.

## 2017-02-18 ENCOUNTER — Ambulatory Visit (INDEPENDENT_AMBULATORY_CARE_PROVIDER_SITE_OTHER): Payer: BC Managed Care – PPO | Admitting: Family Medicine

## 2017-02-18 ENCOUNTER — Encounter: Payer: Self-pay | Admitting: Family Medicine

## 2017-02-18 VITALS — BP 108/70 | HR 77 | Temp 98.9°F | Resp 20 | Ht 67.0 in | Wt 159.5 lb

## 2017-02-18 DIAGNOSIS — R42 Dizziness and giddiness: Secondary | ICD-10-CM

## 2017-02-18 DIAGNOSIS — R748 Abnormal levels of other serum enzymes: Secondary | ICD-10-CM

## 2017-02-18 DIAGNOSIS — A419 Sepsis, unspecified organism: Secondary | ICD-10-CM

## 2017-02-18 DIAGNOSIS — M791 Myalgia, unspecified site: Secondary | ICD-10-CM

## 2017-02-18 LAB — COMPREHENSIVE METABOLIC PANEL
ALK PHOS: 114 U/L (ref 39–117)
ALT: 13 U/L (ref 0–35)
AST: 16 U/L (ref 0–37)
Albumin: 4.2 g/dL (ref 3.5–5.2)
BILIRUBIN TOTAL: 0.6 mg/dL (ref 0.2–1.2)
BUN: 12 mg/dL (ref 6–23)
CALCIUM: 9.6 mg/dL (ref 8.4–10.5)
CO2: 29 meq/L (ref 19–32)
Chloride: 105 mEq/L (ref 96–112)
Creatinine, Ser: 0.74 mg/dL (ref 0.40–1.20)
GFR: 87.55 mL/min (ref >60.00)
Glucose, Bld: 90 mg/dL (ref 70–99)
POTASSIUM: 4.7 meq/L (ref 3.5–5.1)
Sodium: 140 mEq/L (ref 135–145)
Total Protein: 6.9 g/dL (ref 6.0–8.3)

## 2017-02-18 LAB — CBC WITH DIFFERENTIAL/PLATELET
BASOS ABS: 0.1 10*3/uL (ref 0.0–0.1)
Basophils Relative: 0.8 % (ref 0.0–3.0)
EOS PCT: 1.5 % (ref 0.0–5.0)
Eosinophils Absolute: 0.1 10*3/uL (ref 0.0–0.7)
HCT: 38.2 % (ref 36.0–46.0)
Hemoglobin: 12.9 g/dL (ref 12.0–15.0)
LYMPHS PCT: 40.4 % (ref 12.0–46.0)
Lymphs Abs: 2.7 10*3/uL (ref 0.7–4.0)
MCHC: 33.8 g/dL (ref 30.0–36.0)
MCV: 99.6 fl (ref 78.0–100.0)
MONOS PCT: 6 % (ref 3.0–12.0)
Monocytes Absolute: 0.4 10*3/uL (ref 0.1–1.0)
NEUTROS ABS: 3.4 10*3/uL (ref 1.4–7.7)
Neutrophils Relative %: 51.3 % (ref 43.0–77.0)
PLATELETS: 209 10*3/uL (ref 150.0–400.0)
RBC: 3.83 Mil/uL — ABNORMAL LOW (ref 3.87–5.11)
RDW: 12.9 % (ref 11.5–15.5)
WBC: 6.6 10*3/uL (ref 4.0–10.5)

## 2017-02-18 NOTE — Patient Instructions (Signed)
I am glad you are  much improved.  We will call you as soon as all labs resulted, either Friday or Tuesday.  Please help [place] help you:  We are honored you have chosen [facility] for your Primary Care home. Below you will find basic instructions that you may need to access in the future. Please help [place] help you by reading the instructions, which cover many of the frequent questions we experience.   Prescription refills and request:  -In order to allow more efficient response time, please call your pharmacy for all refills. They will forward the request electronically to [place]. This allows for the quickest possible response. Request left on a nurse line can take longer to refill, since these are checked as time allows between office patients and other phone calls.  - refill request can take up to 3-5 working days to complete.  - If request is sent electronically and request is appropiate, it is usually completed in 1-2 business days.  - all patients will need to be seen routinely for all chronic medical conditions requiring prescription medications (see follow-up below). If you are overdue for follow up on your condition, you will be asked to make an appointment and we will call in enough medication to cover you until your appointment (up to 30 days).  - all controlled substances will require a face to face visit to request/refill.  - if you desire your prescriptions to go through a new pharmacy, and have an active script at original pharmacy, you will need to call your pharmacy and have scripts transferred to new pharmacy. This is completed between the pharmacy locations and not by your provider.    Results: If any images or labs were ordered, it can take up to 1 week to get results depending on the test ordered and the lab/facility running and resulting the test. - Normal or stable results, which do not need further discussion, may be released to your mychart immediately with attached note to you.  A call may not be generated for normal results. Please make certain to sign up for mychart. If you have questions on how to activate your mychart you can call the front office.  - If your results need further discussion, our office will attempt to contact you via phone, and if unable to reach you after 2 attempts, we will release your abnormal result to your mychart with instructions.  - All results will be automatically released in mychart after 1 week.  - Your provider will provide you with explanation and instruction on all relevant material in your results. Please keep in mind, results and labs may appear confusing or abnormal to the untrained eye, but it does not mean they are actually abnormal for you personally. If you have any questions about your results that are not covered, or you desire more detailed explanation than what was provided, you should make an appointment with your provider to do so.   Our office handles many outgoing and incoming calls daily. If we have not contacted you within 1 week about your results, please check your mychart to see if there is a message first and if not, then contact our office.  In helping with this matter, you help decrease call volume, and therefore allow [place] to be able to respond to patients needs more efficiently.   Acute office visits (sick visit):  An acute visit is intended for a new problem and are scheduled in shorter time slots to  allow schedule openings for patients with new problems. This is the appropriate visit to discuss a new problem. In order to provide you with excellent quality medical care with proper time for you to explain your problem, have an exam and receive treatment with instructions, these appointments should be limited to one new problem per visit. If you experience a new problem, in which you desire to be addressed, please make an acute office visit, we save openings on the schedule to accommodate you. Please do not save your new  problem for any other type of visit, let us take care of it properly and quickly for you.   Follow up visits:  Depending on your condition(s) your provider will need to see you routinely in order to provide you with quality care and prescribe medication(s). Most chronic conditions (Example: hypertension, Diabetes, depression/anxiety... etc), require visits a couple times a year. Your provider will instruct you on proper follow up for your personal medical conditions and history. Please make certain to make follow up appointments for your condition as instructed. Failing to do so could result in lapse in your medication treatment/refills. If you request a refill, and are overdue to be seen on a condition, we will always provide you with a 30 day script (once) to allow you time to schedule.    Medicare wellness (well visit): - we have a wonderful Nurse Maudie Mercury), that will meet with you and provide you will yearly medicare wellness visits. These visits should occur yearly (can not be scheduled less than 1 calendar year apart) and cover preventive health, immunizations, advance directives and screenings you are entitled to yearly through your medicare benefits. Do not miss out on your entitled benefits, this is when medicare will pay for these benefits to be ordered for you.  These are strongly encouraged by your provider and is the appropriate type of visit to make certain you are up to date with all preventive health benefits. If you have not had your medicare wellness exam in the last 12 months, please make certain to schedule one by calling the office and schedule your medicare wellness with Maudie Mercury as soon as possible.   Yearly physical (well visit):  - Adults are recommended to be seen yearly for physicals. Check with your insurance and date of your last physical, most insurances require one calendar year between physicals. Physicals include all preventive health topics, screenings, medical exam and labs that  are appropriate for gender/age and history. You may have fasting labs needed at this visit. This is a well visit (not a sick visit), new problems should not be covered during this visit (see acute visit).  - Pediatric patients are seen more frequently when they are younger. Your provider will advise you on well child visit timing that is appropriate for your their age. - This is not a medicare wellness visit. Medicare wellness exams do not have an exam portion to the visit. Some medicare companies allow for a physical, some do not allow a yearly physical. If your medicare allows a yearly physical you can schedule the medicare wellness with our nurse Maudie Mercury and have your physical with your provider after, on the same day. Please check with insurance for your full benefits.   Late Policy/No Shows:  - all new patients should arrive 15-30 minutes earlier than appointment to allow [place] time  to  obtain all personal demographics,  insurance information and for you to complete office paperwork. - All established patients should arrive 10-15 minutes  earlier than appointment time to update all information and be checked in .  - In our best efforts to run on time, if you are late for your appointment you will be asked to either reschedule or if able, we will work you back into the schedule. There will be a wait time to work you back in the schedule,  depending on availability.  - If you are unable to make it to your appointment as scheduled, please call 24 hours ahead of time to allow [place] to fill the time slot with someone else who needs to be seen. If you do not cancel your appointment ahead of time, you may be charged a no show fee.

## 2017-02-18 NOTE — Progress Notes (Signed)
Verba Ainley , [birth year], 51 y.o., female MRN: [id number] Patient Care Team    Relationship Specialty Notifications Start End  Ma Hillock, DO PCP - General Family Medicine  05/12/16   Dineen Kid, DO Referring Physician   05/12/16    Comment: psych  Servando Salina, MD Consulting Physician Obstetrics and Gynecology  05/12/16     Chief Complaint  Patient presents with  . Follow-up     Subjective:  Brenda Foster  is a 51 y.o. female presents for follow up on elevated LFT after sepsis.  Pt reports she is feeling much improved. She is still experiencing some dizziness on rare occasions that are typically occurring after extended neck (in the shower, looking up etc). She still has some "muscle aches" and fatigue, but again, MUCH improved. No fever, chills, nausea, vomit, diarrhea or syncope.   Prior note 01/15/2017: hospital follow up after recent admission on July 18 for primary diagnosis sepsis. Pt was discharged on July 24 to her home. Patients discharge summary has been reviewed, as well as all labs/image studies obtained during hospitalization.  Patients hospital course: Patient was admitted with presumed urosepsis. She had had a recent urinary tract infection treated appropriately prior to that admission. On admission patient had a lactic acid 2.3, WBC of 2.1, RBC of 3.43, hemoglobin 11.1, hematocrit 33.4, platelets 72. All labs will be a candidate for more significant with 3 mL of 0.2, platelet 5, hemoglobin 9.6, hematocrit 27.5. Patient had significant elevation in alkaline phosphatase 166, AST 261, ALT 172 . LFTs have slowly started declining at discharge. Urinalysis on admission had large leukocytes, 20 ketones, small amount hemoglobin many bacteria to be received too numerous to count. However urine culture was negative. Blood cultures also negative.  Since hospital discharge patient reports she is still mildly weak feeling, but much improved. She is still  confused and with the actual diagnosis was from her hospital stay. By notes it would seem they're settling on a "likely tickborne illness ". Patient was evaluated by infectious disease. She had a negative hepatitis panel, negative HIV, negative [place] spotted fever test panel. She was prescribed vancomycin and Zosyn on admission, and switched to doxycycline in which she completed a seven-day course. She is returned to her nor mole eating habits. She does complain of some mild urinary frequency. She denies fever, chills, nausea or vomit.  Imaging reviewed: Negative chest x-ray, negative chest CTA, negative CT head, CT abdominal pelvis was concerning for potential cholecystitis. Normal HIDA. No results for input(s): HGB, HCT, WBC, PLT in the last 168 hours. CMP Latest Ref Rng & Units 01/15/2017 01/12/2017 01/11/2017  Glucose 65 - 99 mg/dL 82 89 84  BUN 7 - 25 mg/dL 13 6 5(L)  Creatinine 0.50 - 1.05 mg/dL 0.74 0.62 0.60  Sodium 135 - 146 mmol/L 139 141 139  Potassium 3.5 - 5.3 mmol/L 4.4 3.9 3.8  Chloride 98 - 110 mmol/L 104 108 111  CO2 20 - 31 mmol/L 19(L) 25 24  Calcium 8.6 - 10.4 mg/dL 9.1 8.5(L) 8.1(L)  Total Protein 6.1 - 8.1 g/dL 6.8 5.4(L) 4.9(L)  Total Bilirubin 0.2 - 1.2 mg/dL 0.5 0.5 0.6  Alkaline Phos 33 - 130 U/L 193(H) 183(H) 178(H)  AST 10 - 35 U/L 39(H) 109(H) 124(H)  ALT 6 - 29 U/L 100(H) 147(H) 147(H)      No results found.   Depression screen [facility] 2/9 05/12/2016  Decreased Interest 0  Down, Depressed, Hopeless 0  PHQ - 2 Score 0    Allergies  Allergen Reactions  . Heparin     Do not give per MD  . Sulfa Antibiotics Hives   Social History  Substance Use Topics  . Smoking status: Never Smoker  . Smokeless tobacco: Never Used  . Alcohol use 2.4 - 3.0 oz/week    4 - 5 Glasses of wine per week   Past Medical History:  Diagnosis Date  . Anxiety   . Depression    Mild n/ infertility  . History of miscarriage   . Infertility   . Migraines   . Sepsis ([place])  12/2016  . Urinary tract bacterial infections    2x  . Urine incontinence    Mild   Past Surgical History:  Procedure Laterality Date  . APPENDECTOMY  1980  . BREAST SURGERY  1986   Reduction  . CESAREAN SECTION  2003  . DILATION AND CURETTAGE OF UTERUS  1999-2002  . HYSTEROSCOPY  2001  . LAPAROSCOPY  2001  . TUBAL LIGATION  2003   Family History  Problem Relation Age of Onset  . Cancer Father        prostate  . Heart disease Father 44       MI, premature  . Stroke Father        Mind  . Stroke Maternal Grandfather   . Anemia Mother   . Autism spectrum disorder Daughter   . Stroke Maternal Grandmother    Allergies as of 02/18/2017      Reactions   Heparin    Do not give per MD   Sulfa Antibiotics Hives      Medication List       Accurate as of 02/18/17 10:35 AM. Always use your most recent med list.          FLUoxetine 20 MG tablet Commonly known as:  PROZAC Take 30 mg by mouth daily.   multivitamin tablet Take 1 tablet by mouth daily.   propranolol 10 MG tablet Commonly known as:  INDERAL Take 10 mg by mouth 2 (two) times daily.       All past medical history, surgical history, allergies, family history, immunizations and medications were updated in the EMR today and reviewed under the history and medication portions of their EMR.      ROS: Negative, with the exception of above mentioned in HPI   Objective:  BP 108/70 (BP Location: Left Arm, Patient Position: Sitting, Cuff Size: Normal)   Pulse 77   Temp 98.9 F (37.2 C)   Resp 20   Ht '5\' 7"'  (1.702 m)   Wt 159 lb 8 oz (72.3 kg)   SpO2 100%   BMI 24.98 kg/m  Body mass index is 24.98 kg/m. Gen: Afebrile. No acute distress.  HENT: AT. [place].  MMM.  Eyes:Pupils Equal Round Reactive to light, Extraocular movements intact,  Conjunctiva without redness, discharge or icterus. CV: RRR no murmur, no edema, +2/4 P posterior tibialis pulses Chest: CTAB, no wheeze or crackles Abd: Soft. flat. NTND. BS  present. no Masses palpated. NO HSM.  Skin: no rashes, purpura or petechiae.  Neuro:  Normal gait. PERLA. EOMi. Alert. Oriented x3 Psych: Normal affect, dress and demeanor. Normal speech. Normal thought content and judgment.  No results found for this or any previous visit (from the past 24 hour(s)).  Assessment/Plan: Brenda Foster is a 51 y.o. female present for  Sepsis, due to unspecified organism ([place]) Elevated liver enzymes Myalgia Dizziness -  repeat labs today to ensure levels are returning to normal. Overall she is improving greatly, with mild intermittent dizziness and fatigue remaining.   - CBC w/Diff - Comp Met (CMET) - F/U dependent on lab results.    Reviewed expectations re: course of current medical issues.  Discussed self-management of symptoms.  Outlined signs and symptoms indicating need for more acute intervention.  Patient verbalized understanding and all questions were answered.  Patient received an After-Visit Summary.  Any changes in medications were reviewed and patient was provided with updated med list with their AVS.     No orders of the defined types were placed in this encounter.    Note is dictated utilizing voice recognition software. Although note has been proof read prior to signing, occasional typographical errors still can be missed. If any questions arise, please do not hesitate to call for verification.   electronically signed by:  Howard Pouch, DO  [place]

## 2017-02-19 ENCOUNTER — Telehealth: Payer: Self-pay | Admitting: Family Medicine

## 2017-02-19 ENCOUNTER — Encounter: Payer: Self-pay | Admitting: *Deleted

## 2017-02-19 NOTE — Telephone Encounter (Signed)
Please call Brenda Foster: Great news, all of her labs have returned to baseline/normal.

## 2017-02-19 NOTE — Telephone Encounter (Signed)
Left message with results on voice mail and sent in [facility] Chart.

## 2017-07-28 ENCOUNTER — Encounter: Payer: Self-pay | Admitting: Gastroenterology

## 2017-08-16 ENCOUNTER — Encounter: Payer: Self-pay | Admitting: Gastroenterology

## 2017-08-25 ENCOUNTER — Telehealth: Payer: Self-pay | Admitting: Family Medicine

## 2017-08-25 NOTE — Telephone Encounter (Signed)
Left message for patient she needs to schedule appt with Dr Raoul Pitch for evaluation for her to Rx medication if appropriate.

## 2017-08-25 NOTE — Telephone Encounter (Signed)
Copied from [place] [id number]. Topic: Quick Communication - See Telephone Encounter >> Aug 25, 2017  9:13 AM Burnis Medin, NT wrote: CRM for notification. See Telephone encounter for: Patient called and said the she was taking Fluoxetine 20 mg tablets (1 a day) and Propranolol 10 mg (twice a day) and the doctor that was prescribing these medications is no longer in practice. Pt wanted to know if the doctor can take over these medications or if she have to find another doctor that can take over these medications. She would like a call back.  08/25/17.

## 2017-08-27 ENCOUNTER — Encounter: Payer: Self-pay | Admitting: Family Medicine

## 2017-08-27 ENCOUNTER — Ambulatory Visit: Payer: BC Managed Care – PPO | Admitting: Family Medicine

## 2017-08-27 VITALS — BP 112/75 | HR 61 | Temp 98.0°F | Ht 67.0 in | Wt 167.0 lb

## 2017-08-27 DIAGNOSIS — R251 Tremor, unspecified: Secondary | ICD-10-CM

## 2017-08-27 DIAGNOSIS — F418 Other specified anxiety disorders: Secondary | ICD-10-CM

## 2017-08-27 LAB — HM PAP SMEAR: HM Pap smear: NEGATIVE

## 2017-08-27 LAB — HM MAMMOGRAPHY

## 2017-08-27 MED ORDER — FLUOXETINE HCL 20 MG PO TABS: 30.0000 mg | ORAL_TABLET | Freq: Every day | ORAL | 1 refills | Status: DC

## 2017-08-27 MED ORDER — PROPRANOLOL HCL 10 MG PO TABS: 10.0000 mg | ORAL_TABLET | Freq: Two times a day (BID) | ORAL | 1 refills | Status: DC

## 2017-08-27 NOTE — Progress Notes (Signed)
Brenda Foster , 01/24/1966, 52 y.o., female MRN: [id number] Patient Care Team    Relationship Specialty Notifications Start End  Ma Hillock, DO PCP - General Family Medicine  05/12/16   Dineen Kid, DO Referring Physician   05/12/16    Comment: psych  Servando Salina, MD Consulting Physician Obstetrics and Gynecology  05/12/16     Chief Complaint  Patient presents with  . medication review    pt is here to review her fluoxetine and propranolol     Subjective: Pt presents for an OV with complaints of anxiety and tremor from prozac of 1.5 year duration.  Condition is well controlled on prozac 30 mg and Propanolol 10 mg BID. Her prior psychiatric provider has moved and she would like to come here for her care surrounding this issue.   Depression screen [facility] 2/9 08/27/2017 05/12/2016  Decreased Interest 0 0  Down, Depressed, Hopeless 0 0  PHQ - 2 Score 0 0  Altered sleeping 1 -  Tired, decreased energy 0 -  Change in appetite 0 -  Feeling bad or failure about yourself  0 -  Trouble concentrating 1 -  Moving slowly or fidgety/restless 0 -  Suicidal thoughts 0 -  PHQ-9 Score 2 -    Allergies  Allergen Reactions  . Heparin     Do not give per MD  . Sulfa Antibiotics Hives   Social History   Tobacco Use  . Smoking status: Never Smoker  . Smokeless tobacco: Never Used  Substance Use Topics  . Alcohol use: Yes    Alcohol/week: 2.4 - 3.0 oz    Types: 4 - 5 Glasses of wine per week   Past Medical History:  Diagnosis Date  . Anxiety   . Depression    Mild n/ infertility  . History of miscarriage   . Infertility   . Migraines   . Sepsis ([place]) 12/2016  . Urinary tract bacterial infections    2x  . Urine incontinence    Mild   Past Surgical History:  Procedure Laterality Date  . APPENDECTOMY  1980  . BREAST SURGERY  1986   Reduction  . CESAREAN SECTION  2003  . DILATION AND CURETTAGE OF UTERUS  1999-2002  . HYSTEROSCOPY  2001  . LAPAROSCOPY  2001    . TUBAL LIGATION  2003   Family History  Problem Relation Age of Onset  . Cancer Father        prostate  . Heart disease Father 100       MI, premature  . Stroke Father        Mind  . Stroke Maternal Grandfather   . Anemia Mother   . Autism spectrum disorder Daughter   . Stroke Maternal Grandmother    Allergies as of 08/27/2017      Reactions   Heparin    Do not give per MD   Sulfa Antibiotics Hives      Medication List        Accurate as of 08/27/17 11:32 AM. Always use your most recent med list.          FLUoxetine 20 MG tablet Commonly known as:  PROZAC Take 30 mg by mouth daily.   multivitamin tablet Take 1 tablet by mouth daily.   propranolol 10 MG tablet Commonly known as:  INDERAL Take 10 mg by mouth 2 (two) times daily.       All past medical history, surgical history, allergies, family history,  immunizations andmedications were updated in the EMR today and reviewed under the history and medication portions of their EMR.     ROS: Negative, with the exception of above mentioned in HPI   Objective:  BP 112/75 (BP Location: Left Arm, Patient Position: Sitting, Cuff Size: Normal)   Pulse 61   Temp 98 F (36.7 C) (Oral)   Ht 5\' 7"  (1.702 m)   Wt 167 lb (75.8 kg)   SpO2 100%   BMI 26.16 kg/m  Body mass index is 26.16 kg/m. Gen: Afebrile. No acute distress. Nontoxic in appearance, well developed, well nourished.  CV: RRR Chest: CTAB, no wheeze or crackles. Good air movement, normal resp effort.  Neuro: Normal gait. PERLA. EOMi. Alert. Oriented x3 No tremor noted Psych: Normal affect, dress and demeanor. Normal speech. Normal thought content and judgment.  No exam data present No results found. No results found for this or any previous visit (from the past 24 hour(s)).  Assessment/Plan: Kaylaann Mountz is a 52 y.o. female present for OV for  Situational anxiety Tremor Has been stable on Prozac 30 mg QD and Propanol 10 mg BID (tremor 2/2 to  prozac). Her prior provider for this issue has moved. Ok taking over management. Refills provided for 6 mos  If she decides she would like therapy, she can call in anytime and I will be happy to place referral for her to Dr. Mamie Levers.    Reviewed expectations re: course of current medical issues.  Discussed self-management of symptoms.  Outlined signs and symptoms indicating need for more acute intervention.  Patient verbalized understanding and all questions were answered.  Patient received an After-Visit Summary.    No orders of the defined types were placed in this encounter.    Note is dictated utilizing voice recognition software. Although note has been proof read prior to signing, occasional typographical errors still can be missed. If any questions arise, please do not hesitate to call for verification.   electronically signed by:  Howard Pouch, DO  [place]

## 2017-08-27 NOTE — Patient Instructions (Signed)
Nice to see you again.  I am glad you are doing well.  I have refilled your meds for 6 months. F/u in 6 mos, sooner if needed.    If you decide you would like referred to counseling, just call in and I will be happy to place referral anytime.    Please help [place] help you:  We are honored you have chosen [facility] for your Primary Care home. Below you will find basic instructions that you may need to access in the future. Please help [place] help you by reading the instructions, which cover many of the frequent questions we experience.   Prescription refills and request:  -In order to allow more efficient response time, please call your pharmacy for all refills. They will forward the request electronically to [place]. This allows for the quickest possible response. Request left on a nurse line can take longer to refill, since these are checked as time allows between office patients and other phone calls.  - refill request can take up to 3-5 working days to complete.  - If request is sent electronically and request is appropiate, it is usually completed in 1-2 business days.  - all patients will need to be seen routinely for all chronic medical conditions requiring prescription medications (see follow-up below). If you are overdue for follow up on your condition, you will be asked to make an appointment and we will call in enough medication to cover you until your appointment (up to 30 days).  - all controlled substances will require a face to face visit to request/refill.  - if you desire your prescriptions to go through a new pharmacy, and have an active script at original pharmacy, you will need to call your pharmacy and have scripts transferred to new pharmacy. This is completed between the pharmacy locations and not by your provider.    Results: If any images or labs were ordered, it can take up to 1 week to get results depending on the test ordered and the lab/facility running and resulting the  test. - Normal or stable results, which do not need further discussion, may be released to your mychart immediately with attached note to you. A call may not be generated for normal results. Please make certain to sign up for mychart. If you have questions on how to activate your mychart you can call the front office.  - If your results need further discussion, our office will attempt to contact you via phone, and if unable to reach you after 2 attempts, we will release your abnormal result to your mychart with instructions.  - All results will be automatically released in mychart after 1 week.  - Your provider will provide you with explanation and instruction on all relevant material in your results. Please keep in mind, results and labs may appear confusing or abnormal to the untrained eye, but it does not mean they are actually abnormal for you personally. If you have any questions about your results that are not covered, or you desire more detailed explanation than what was provided, you should make an appointment with your provider to do so.   Our office handles many outgoing and incoming calls daily. If we have not contacted you within 1 week about your results, please check your mychart to see if there is a message first and if not, then contact our office.  In helping with this matter, you help decrease call volume, and therefore allow [place] to be able  to respond to patients needs more efficiently.   Acute office visits (sick visit):  An acute visit is intended for a new problem and are scheduled in shorter time slots to allow schedule openings for patients with new problems. This is the appropriate visit to discuss a new problem. In order to provide you with excellent quality medical care with proper time for you to explain your problem, have an exam and receive treatment with instructions, these appointments should be limited to one new problem per visit. If you experience a new problem, in which you  desire to be addressed, please make an acute office visit, we save openings on the schedule to accommodate you. Please do not save your new problem for any other type of visit, let us take care of it properly and quickly for you.   Follow up visits:  Depending on your condition(s) your provider will need to see you routinely in order to provide you with quality care and prescribe medication(s). Most chronic conditions (Example: hypertension, Diabetes, depression/anxiety... etc), require visits a couple times a year. Your provider will instruct you on proper follow up for your personal medical conditions and history. Please make certain to make follow up appointments for your condition as instructed. Failing to do so could result in lapse in your medication treatment/refills. If you request a refill, and are overdue to be seen on a condition, we will always provide you with a 30 day script (once) to allow you time to schedule.    Medicare wellness (well visit): - we have a wonderful Nurse Maudie Mercury), that will meet with you and provide you will yearly medicare wellness visits. These visits should occur yearly (can not be scheduled less than 1 calendar year apart) and cover preventive health, immunizations, advance directives and screenings you are entitled to yearly through your medicare benefits. Do not miss out on your entitled benefits, this is when medicare will pay for these benefits to be ordered for you.  These are strongly encouraged by your provider and is the appropriate type of visit to make certain you are up to date with all preventive health benefits. If you have not had your medicare wellness exam in the last 12 months, please make certain to schedule one by calling the office and schedule your medicare wellness with Maudie Mercury as soon as possible.   Yearly physical (well visit):  - Adults are recommended to be seen yearly for physicals. Check with your insurance and date of your last physical, most  insurances require one calendar year between physicals. Physicals include all preventive health topics, screenings, medical exam and labs that are appropriate for gender/age and history. You may have fasting labs needed at this visit. This is a well visit (not a sick visit), new problems should not be covered during this visit (see acute visit).  - Pediatric patients are seen more frequently when they are younger. Your provider will advise you on well child visit timing that is appropriate for your their age. - This is not a medicare wellness visit. Medicare wellness exams do not have an exam portion to the visit. Some medicare companies allow for a physical, some do not allow a yearly physical. If your medicare allows a yearly physical you can schedule the medicare wellness with our nurse Maudie Mercury and have your physical with your provider after, on the same day. Please check with insurance for your full benefits.   Late Policy/No Shows:  - all new patients should arrive 15-30 minutes  earlier than appointment to allow [place] time  to  obtain all personal demographics,  insurance information and for you to complete office paperwork. - All established patients should arrive 10-15 minutes earlier than appointment time to update all information and be checked in .  - In our best efforts to run on time, if you are late for your appointment you will be asked to either reschedule or if able, we will work you back into the schedule. There will be a wait time to work you back in the schedule,  depending on availability.  - If you are unable to make it to your appointment as scheduled, please call 24 hours ahead of time to allow [place] to fill the time slot with someone else who needs to be seen. If you do not cancel your appointment ahead of time, you may be charged a no show fee.

## 2017-08-30 ENCOUNTER — Encounter: Payer: Self-pay | Admitting: Family Medicine

## 2017-09-20 ENCOUNTER — Ambulatory Visit (AMBULATORY_SURGERY_CENTER): Payer: Self-pay

## 2017-09-20 VITALS — Ht 66.75 in

## 2017-09-20 DIAGNOSIS — Z86010 Personal history of colonic polyps: Secondary | ICD-10-CM

## 2017-09-20 MED ORDER — NA SULFATE-K SULFATE-MG SULF 17.5-3.13-1.6 GM/177ML PO SOLN: 1.0000 | Freq: Once | ORAL | 0 refills | Status: AC

## 2017-09-20 NOTE — Progress Notes (Signed)
Per pt, no allergies to soy or egg products.Pt not taking any weight loss meds or using  O2 at home.  Pt refused emmi video. 

## 2017-09-21 ENCOUNTER — Encounter: Payer: Self-pay | Admitting: Gastroenterology

## 2017-09-30 ENCOUNTER — Encounter: Payer: BC Managed Care – PPO | Admitting: Gastroenterology

## 2017-09-30 ENCOUNTER — Other Ambulatory Visit: Payer: Self-pay

## 2017-09-30 ENCOUNTER — Encounter: Payer: Self-pay | Admitting: Gastroenterology

## 2017-09-30 ENCOUNTER — Ambulatory Visit (AMBULATORY_SURGERY_CENTER): Payer: BC Managed Care – PPO | Admitting: Gastroenterology

## 2017-09-30 VITALS — BP 99/68 | HR 59 | Temp 97.3°F | Resp 13 | Ht 66.75 in | Wt 167.0 lb

## 2017-09-30 DIAGNOSIS — Z86010 Personal history of colonic polyps: Secondary | ICD-10-CM

## 2017-09-30 DIAGNOSIS — D123 Benign neoplasm of transverse colon: Secondary | ICD-10-CM | POA: Diagnosis not present

## 2017-09-30 HISTORY — PX: COLONOSCOPY: SHX174

## 2017-09-30 MED ORDER — SODIUM CHLORIDE 0.9 % IV SOLN: 500.0000 mL | Freq: Once | INTRAVENOUS | Status: DC

## 2017-09-30 NOTE — Op Note (Signed)
[facility] Patient Name: Brenda Foster Procedure Date: 09/30/2017 9:53 AM MRN: [id number] Endoscopist: Mauri Pole , MD Age: 52 Referring MD:  Date of Birth: Apr 27, 1966 Gender: Female Account #: [id number] Procedure:                Colonoscopy Indications:              Surveillance: Piecemeal removal of large sessile                            adenoma last colonoscopy (< 3 yrs) Medicines:                Monitored Anesthesia Care Procedure:                Pre-Anesthesia Assessment:                           - Prior to the procedure, a History and Physical                            was performed, and patient medications and                            allergies were reviewed. The patient's tolerance of                            previous anesthesia was also reviewed. The risks                            and benefits of the procedure and the sedation                            options and risks were discussed with the patient.                            All questions were answered, and informed consent                            was obtained. Prior Anticoagulants: The patient has                            taken no previous anticoagulant or antiplatelet                            agents. ASA Grade Assessment: II - A patient with                            mild systemic disease. After reviewing the risks                            and benefits, the patient was deemed in                            satisfactory condition to undergo the procedure.  After obtaining informed consent, the colonoscope                            was passed under direct vision. Throughout the                            procedure, the patient's blood pressure, pulse, and                            oxygen saturations were monitored continuously. The                            Model PCF-H190DL [id number]) scope was introduced                            through the anus  and advanced to the the cecum,                            identified by appendiceal orifice and ileocecal                            valve. The colonoscopy was performed without                            difficulty. The patient tolerated the procedure                            well. The quality of the bowel preparation was                            excellent. The ileocecal valve, appendiceal                            orifice, and rectum were photographed. Scope In: 9:54:36 AM Scope Out: 10:13:42 AM Scope Withdrawal Time: 0 hours 14 minutes 3 seconds  Total Procedure Duration: 0 hours 19 minutes 6 seconds  Findings:                 The perianal and digital rectal examinations were                            normal.                           A 16 mm polyp was found in the transverse colon.                            The polyp was granular lateral spreading. The polyp                            was removed with a piecemeal technique using a cold                            snare. Resection and retrieval were complete.  Scattered small-mouthed diverticula were found in                            the sigmoid colon and descending colon.                           Non-bleeding internal hemorrhoids were found during                            retroflexion. The hemorrhoids were small. Complications:            No immediate complications. Estimated Blood Loss:     Estimated blood loss was minimal. Impression:               - One 16 mm polyp in the transverse colon, removed                            piecemeal using a cold snare. Resected and                            retrieved.                           - Moderate diverticulosis in the sigmoid colon and                            in the descending colon.                           - Non-bleeding internal hemorrhoids. Recommendation:           - Patient has a contact number available for                             emergencies. The signs and symptoms of potential                            delayed complications were discussed with the                            patient. Return to normal activities tomorrow.                            Written discharge instructions were provided to the                            patient.                           - Resume previous diet.                           - Continue present medications.                           - Await pathology results.                           -  Repeat colonoscopy in 1 year for surveillance                            after piecemeal polypectomy. Mauri Pole, MD 09/30/2017 10:21:45 AM This report has been signed electronically.

## 2017-09-30 NOTE — Progress Notes (Signed)
To PACU, vss patent aw report to rn 

## 2017-09-30 NOTE — Progress Notes (Signed)
Called to room to assist during endoscopic procedure.  Patient ID and intended procedure confirmed with present staff. Received instructions for my participation in the procedure from the performing physician.  

## 2017-09-30 NOTE — Patient Instructions (Signed)
YOU HAD AN ENDOSCOPIC PROCEDURE TODAY AT THE [facility]:   Refer to the procedure report that was given to you for any specific questions about what was found during the examination.  If the procedure report does not answer your questions, please call your gastroenterologist to clarify.  If you requested that your care partner not be given the details of your procedure findings, then the procedure report has been included in a sealed envelope for you to review at your convenience later.  YOU SHOULD EXPECT: Some feelings of bloating in the abdomen. Passage of more gas than usual.  Walking can help get rid of the air that was put into your GI tract during the procedure and reduce the bloating. If you had a lower endoscopy (such as a colonoscopy or flexible sigmoidoscopy) you may notice spotting of blood in your stool or on the toilet paper. If you underwent a bowel prep for your procedure, you may not have a normal bowel movement for a few days.  Please Note:  You might notice some irritation and congestion in your nose or some drainage.  This is from the oxygen used during your procedure.  There is no need for concern and it should clear up in a day or so.  SYMPTOMS TO REPORT IMMEDIATELY:   Following lower endoscopy (colonoscopy or flexible sigmoidoscopy):  Excessive amounts of blood in the stool  Significant tenderness or worsening of abdominal pains  Swelling of the abdomen that is new, acute  Fever of 100F or higher  Please see handouts given to you on polyps, hemorrhoids and Diverticulosis.  For urgent or emergent issues, a gastroenterologist can be reached at any hour by calling [phone].   DIET:  We do recommend a small meal at first, but then you may proceed to your regular diet.  Drink plenty of fluids but you should avoid alcoholic beverages for 24 hours.  ACTIVITY:  You should plan to take it easy for the rest of today and you should NOT DRIVE or use heavy  machinery until tomorrow (because of the sedation medicines used during the test).    FOLLOW UP: Our staff will call the number listed on your records the next business day following your procedure to check on you and address any questions or concerns that you may have regarding the information given to you following your procedure. If we do not reach you, we will leave a message.  However, if you are feeling well and you are not experiencing any problems, there is no need to return our call.  We will assume that you have returned to your regular daily activities without incident.  If any biopsies were taken you will be contacted by phone or by letter within the next 1-3 weeks.  Please call us at [phone] if you have not heard about the biopsies in 3 weeks.    SIGNATURES/CONFIDENTIALITY: You and/or your care partner have signed paperwork which will be entered into your electronic medical record.  These signatures attest to the fact that that the information above on your After Visit Summary has been reviewed and is understood.  Full responsibility of the confidentiality of this discharge information lies with you and/or your care-partner.  Thank you for letting us take care of your healthcare needs today. 

## 2017-09-30 NOTE — Progress Notes (Signed)
Pt's states no medical or surgical changes since previsit or office visit.  Pt did reported she started with nasal congestion, sneezing, and little cough.  She took OTC Nyquil [facility] brand this past Tuesday.  No fever 97.3.  maw

## 2017-10-01 ENCOUNTER — Telehealth: Payer: Self-pay | Admitting: *Deleted

## 2017-10-01 NOTE — Telephone Encounter (Signed)
  Follow up Call-  Call back number 09/30/2017 07/16/2016  Post procedure Call Back phone  # #[phone] cell [phone]  Permission to leave phone message Yes Yes  Some recent data might be hidden     Patient questions:  Do you have a fever, pain , or abdominal swelling? No. Pain Score  0 *  Have you tolerated food without any problems? Yes.    Have you been able to return to your normal activities? Yes.    Do you have any questions about your discharge instructions: Diet   No. Medications  No. Follow up visit  No.  Do you have questions or concerns about your Care? No.  Actions: * If pain score is 4 or above: No action needed, pain <4.

## 2017-10-01 NOTE — Telephone Encounter (Signed)
No answer. No identifier. Message left to call if questions or concerns and we would make an attempt to call later in the day.

## 2017-10-06 ENCOUNTER — Encounter: Payer: Self-pay | Admitting: Gastroenterology

## 2017-12-08 ENCOUNTER — Ambulatory Visit (HOSPITAL_BASED_OUTPATIENT_CLINIC_OR_DEPARTMENT_OTHER)
Admission: RE | Admit: 2017-12-08 | Discharge: 2017-12-08 | Disposition: A | Discharge status: Home / Self Care | Payer: BC Managed Care – PPO | Source: Ambulatory Visit | Attending: Family Medicine | Admitting: Family Medicine

## 2017-12-08 ENCOUNTER — Ambulatory Visit: Payer: BC Managed Care – PPO | Admitting: Family Medicine

## 2017-12-08 ENCOUNTER — Encounter: Payer: Self-pay | Admitting: Family Medicine

## 2017-12-08 VITALS — BP 115/79 | HR 67 | Temp 97.9°F | Resp 20 | Ht 66.5 in | Wt 167.0 lb

## 2017-12-08 DIAGNOSIS — M2550 Pain in unspecified joint: Secondary | ICD-10-CM | POA: Diagnosis not present

## 2017-12-08 DIAGNOSIS — M5136 Other intervertebral disc degeneration, lumbar region: Secondary | ICD-10-CM | POA: Insufficient documentation

## 2017-12-08 DIAGNOSIS — M25551 Pain in right hip: Secondary | ICD-10-CM

## 2017-12-08 DIAGNOSIS — M25552 Pain in left hip: Secondary | ICD-10-CM | POA: Insufficient documentation

## 2017-12-08 DIAGNOSIS — R202 Paresthesia of skin: Secondary | ICD-10-CM | POA: Diagnosis not present

## 2017-12-08 DIAGNOSIS — R5383 Other fatigue: Secondary | ICD-10-CM

## 2017-12-08 DIAGNOSIS — M545 Low back pain, unspecified: Secondary | ICD-10-CM

## 2017-12-08 DIAGNOSIS — M160 Bilateral primary osteoarthritis of hip: Secondary | ICD-10-CM | POA: Diagnosis not present

## 2017-12-08 DIAGNOSIS — R61 Generalized hyperhidrosis: Secondary | ICD-10-CM | POA: Diagnosis not present

## 2017-12-08 DIAGNOSIS — R413 Other amnesia: Secondary | ICD-10-CM

## 2017-12-08 MED ORDER — DICLOFENAC SODIUM 75 MG PO TBEC: 75.0000 mg | DELAYED_RELEASE_TABLET | Freq: Two times a day (BID) | ORAL | 5 refills | Status: DC

## 2017-12-08 NOTE — Progress Notes (Signed)
Brenda Foster , 1965/08/15, 52 y.o., female MRN: [id number] Patient Care Team    Relationship Specialty Notifications Start End  Ma Hillock, DO PCP - General Family Medicine  05/12/16   Dineen Kid, DO Referring Physician   05/12/16    Comment: psych  Servando Salina, MD Consulting Physician Obstetrics and Gynecology  05/12/16     Chief Complaint  Patient presents with  . Back Pain  . Joint Pain     Subjective: Pt is a 52 y.o. female presents for an OV with multiple complaints today.  She reports she just does not feel her normal self any longer.  She has been slowly progressing and her symptoms since last fall.  She has multiple arthralgia complaints of bilateral hips, bilateral knees and lower back.  She reports she has been seen by spine specialist since January.  She has been performing physical therapy since then.  She feels her back discomfort has worsened with standing.  She reports she has a stabbing pain when she stands.  She has radiating pain to her bilateral hips.  She has started using a standing desk at work to help prevent the symptoms.  She reports sometimes it feels better when walking.  She walks a couple miles a day to help with relief.  Reports she is doing very well on physical therapy and they comment on her range of motion being so great.  He has been tried on Mobic and she did not feel that was very helpful.  She also feels like she is in a "brain fog".  Has decreased energy.  She is worried she has Lyme disease.  She became rather ill in July of last year was hospitalized.  She had an infectious disease work-up at that time which resulted with negative Lyme titers, negative hepatitis hepatitis, negative [place] spotted fever titers, negative E. Chaffeensis titers, CMV IgM was negative at that time.  Negative for heavy metals lead/mercury also in July 2018.  MRI August 22, 2016 is altered with L1-L2 mild disc narrowing and desiccation with central endplate  spurring.  L3-L4 mild far lateral disc bulging and facet spurring.  L4-L5 mild disc narrowing and desiccation.  Disc bulging eccentric to the right far lateral region.  Annular fissure noticed posteriorly.  Mild facet spurring.  L5-S1 mild disc narrowing and bulging with tiny central protrusion.  Facet arthropathy with mild spurring.  She does not have a family history of arthritis.  Depression screen [facility] 2/9 08/27/2017 05/12/2016  Decreased Interest 0 0  Down, Depressed, Hopeless 0 0  PHQ - 2 Score 0 0  Altered sleeping 1 -  Tired, decreased energy 0 -  Change in appetite 0 -  Feeling bad or failure about yourself  0 -  Trouble concentrating 1 -  Moving slowly or fidgety/restless 0 -  Suicidal thoughts 0 -  PHQ-9 Score 2 -    Allergies  Allergen Reactions  . Heparin     Do not give per MD  . Sulfa Antibiotics Hives   Social History   Tobacco Use  . Smoking status: Never Smoker  . Smokeless tobacco: Never Used  Substance Use Topics  . Alcohol use: Yes    Alcohol/week: 3.6 oz    Types: 6 Glasses of wine per week   Past Medical History:  Diagnosis Date  . Anxiety   . Depression    Mild n/ infertility  . History of miscarriage   . Infertility   .  Interstitial cystitis   . Low back pain   . Migraines   . Sepsis ([place]) 12/2016   in hospital 1 week  . Urinary tract bacterial infections    2x  . Urine incontinence    Mild   Past Surgical History:  Procedure Laterality Date  . APPENDECTOMY  1980  . BREAST SURGERY  1986   Reduction  . CERVICAL CONE BIOPSY  1990  . CESAREAN SECTION  2003   1 time  . DILATION AND CURETTAGE OF UTERUS  1999-2002  . HYSTEROSCOPY  2001  . LAPAROSCOPY  2001  . TUBAL LIGATION  2003   Family History  Problem Relation Age of Onset  . Cancer Father        prostate  . Heart disease Father 41       MI, premature  . Stroke Father        Mind  . Prostate cancer Father   . Stroke Maternal Grandfather   . Anemia Mother   . Autism spectrum  disorder Daughter   . Stroke Maternal Grandmother   . Colon cancer Neg Hx   . Esophageal cancer Neg Hx   . Liver cancer Neg Hx   . Pancreatic cancer Neg Hx   . Rectal cancer Neg Hx   . Stomach cancer Neg Hx    Allergies as of 12/08/2017      Reactions   Heparin    Do not give per MD   Sulfa Antibiotics Hives      Medication List        Accurate as of 12/08/17 11:59 PM. Always use your most recent med list.          CALCIUM-VITAMIN D PO Take by mouth. Take 2 pills daily   diclofenac 75 MG EC tablet Commonly known as:  VOLTAREN Take 1 tablet (75 mg total) by mouth 2 (two) times daily.   FLUoxetine 20 MG tablet Commonly known as:  PROZAC Take 1.5 tablets (30 mg total) by mouth daily.   multivitamin tablet Take 1 tablet by mouth daily.   propranolol 10 MG tablet Commonly known as:  INDERAL Take 1 tablet (10 mg total) by mouth 2 (two) times daily.   TART CHERRY ADVANCED PO Take by mouth daily.       All past medical history, surgical history, allergies, family history, immunizations andmedications were updated in the EMR today and reviewed under the history and medication portions of their EMR.     ROS: Negative, with the exception of above mentioned in HPI   Objective:  BP 115/79 (BP Location: Left Arm, Patient Position: Sitting, Cuff Size: Normal)   Pulse 67   Temp 97.9 F (36.6 C)   Resp 20   Ht 5' 6.5" (1.689 m)   Wt 167 lb (75.8 kg)   LMP 01/20/2016 (Approximate)   SpO2 97%   BMI 26.55 kg/m  Body mass index is 26.55 kg/m. Gen: Afebrile. No acute distress. Nontoxic in appearance, well developed, well nourished.  Standing.  Pleasant Caucasian female. HENT: AT. [place]. Bilateral TM visualized without erythema or bulging. MMM, no oral lesions. Bilateral nares without erythema or drainage. Throat without erythema or exudates.  No cough.  No hoarseness. Eyes:Pupils Equal Round Reactive to light, Extraocular movements intact,  Conjunctiva without redness,  discharge or icterus. Neck/lymp/endocrine: Supple, no lymphadenopathy CV: RRR no murmur, no edema Chest: CTAB, no wheeze or crackles. Good air movement, normal resp effort.  MSK: No erythema, no obvious bony deformities.  Great range of motion.  Discomfort to palpation bilateral lateral hip over bony process.  Negative straight leg raise bilaterally.  Mild discomfort with left FABRE.  Neurovascularly intact distally. Skin: No rashes, purpura or petechiae.  Neuro:  Normal gait. PERLA. EOMi. Alert. Oriented x3 Cranial nerves II through XII intact. Muscle strength 5/5 bilateral upper and lower extremity. DTRs equal bilaterally. Psych:  anxious.  Normal affect, dress and demeanor. Normal speech. Normal thought content and judgment.  No exam data present No results found. No results found for this or any previous visit (from the past 24 hour(s)).  Assessment/Plan: Floella Ensz is a 52 y.o. female present for OV for  Polyarthralgia/memory deficit/paresthesia/fatigue/night sweats/lumbar pain/bilateral hip pain -Lengthy discussion with patient today on the various possibilities causing her symptoms including infectious, autoimmune, inflammatory etc. -Start diclofenac twice daily - B. Burgdorfi Antibodies - Cyclic citrul peptide antibody, IgG - Rheumatoid Factor - Sedimentation rate - C-reactive protein - Antinuclear Antib (ANA) - TSH - B12 - Vitamin D (25 hydroxy) - Epstein-Barr virus VCA antibody panel -Follow-up dependent upon lab/image results results.  Consider referral to rheumatology.    Reviewed expectations re: course of current medical issues.  Discussed self-management of symptoms.  Outlined signs and symptoms indicating need for more acute intervention.  Patient verbalized understanding and all questions were answered.  Patient received an After-Visit Summary.    Orders Placed This Encounter  Procedures  . DG Lumbar Spine Complete  . DG HIPS BILAT WITH PELVIS 3-4  VIEWS  . B. Burgdorfi Antibodies  . Cyclic citrul peptide antibody, IgG  . Rheumatoid Factor  . Sedimentation rate  . C-reactive protein  . Antinuclear Antib (ANA)  . TSH  . B12  . Vitamin D (25 hydroxy)  . Epstein-Barr virus VCA antibody panel  . Anti-nuclear ab-titer (ANA titer)     Note is dictated utilizing voice recognition software. Although note has been proof read prior to signing, occasional typographical errors still can be missed. If any questions arise, please do not hesitate to call for verification.   electronically signed by:  Howard Pouch, DO  [place]

## 2017-12-08 NOTE — Patient Instructions (Signed)
Voltaren every 12 hours with food.  We will call you once xrays and labs are returned.   Stay active as possible. We will try to get to the bottom of your discomfort for you.  Please help [place] help you:  We are honored you have chosen [facility] for your [facility] home. Below you will find basic instructions that you may need to access in the future. Please help [place] help you by reading the instructions, which cover many of the frequent questions we experience.   Prescription refills and request:  -In order to allow more efficient response time, please call your pharmacy for all refills. They will forward the request electronically to [place]. This allows for the quickest possible response. Request left on a nurse line can take longer to refill, since these are checked as time allows between office patients and other phone calls.  - refill request can take up to 3-5 working days to complete.  - If request is sent electronically and request is appropiate, it is usually completed in 1-2 business days.  - all patients will need to be seen routinely for all chronic medical conditions requiring prescription medications (see follow-up below). If you are overdue for follow up on your condition, you will be asked to make an appointment and we will call in enough medication to cover you until your appointment (up to 30 days).  - all controlled substances will require a face to face visit to request/refill.  - if you desire your prescriptions to go through a new pharmacy, and have an active script at original pharmacy, you will need to call your pharmacy and have scripts transferred to new pharmacy. This is completed between the pharmacy locations and not by your provider.    Results: If any images or labs were ordered, it can take up to 1 week to get results depending on the test ordered and the lab/facility running and resulting the test. - Normal or stable results, which do not need further discussion,  may be released to your mychart immediately with attached note to you. A call may not be generated for normal results. Please make certain to sign up for mychart. If you have questions on how to activate your mychart you can call the front office.  - If your results need further discussion, our office will attempt to contact you via phone, and if unable to reach you after 2 attempts, we will release your abnormal result to your mychart with instructions.  - All results will be automatically released in mychart after 1 week.  - Your provider will provide you with explanation and instruction on all relevant material in your results. Please keep in mind, results and labs may appear confusing or abnormal to the untrained eye, but it does not mean they are actually abnormal for you personally. If you have any questions about your results that are not covered, or you desire more detailed explanation than what was provided, you should make an appointment with your provider to do so.   Our office handles many outgoing and incoming calls daily. If we have not contacted you within 1 week about your results, please check your mychart to see if there is a message first and if not, then contact our office.  In helping with this matter, you help decrease call volume, and therefore allow [place] to be able to respond to patients needs more efficiently.   Acute office visits (sick visit):  An acute visit is  intended for a new problem and are scheduled in shorter time slots to allow schedule openings for patients with new problems. This is the appropriate visit to discuss a new problem. Problems will not be addressed by phone call or Echart message. Appointment is needed if requesting treatment. In order to provide you with excellent quality medical care with proper time for you to explain your problem, have an exam and receive treatment with instructions, these appointments should be limited to one new problem per visit. If you  experience a new problem, in which you desire to be addressed, please make an acute office visit, we save openings on the schedule to accommodate you. Please do not save your new problem for any other type of visit, let us take care of it properly and quickly for you.   Follow up visits:  Depending on your condition(s) your provider will need to see you routinely in order to provide you with quality care and prescribe medication(s). Most chronic conditions (Example: hypertension, Diabetes, depression/anxiety... etc), require visits a couple times a year. Your provider will instruct you on proper follow up for your personal medical conditions and history. Please make certain to make follow up appointments for your condition as instructed. Failing to do so could result in lapse in your medication treatment/refills. If you request a refill, and are overdue to be seen on a condition, we will always provide you with a 30 day script (once) to allow you time to schedule.    Medicare wellness (well visit): - we have a wonderful Nurse Maudie Mercury), that will meet with you and provide you will yearly medicare wellness visits. These visits should occur yearly (can not be scheduled less than 1 calendar year apart) and cover preventive health, immunizations, advance directives and screenings you are entitled to yearly through your medicare benefits. Do not miss out on your entitled benefits, this is when medicare will pay for these benefits to be ordered for you.  These are strongly encouraged by your provider and is the appropriate type of visit to make certain you are up to date with all preventive health benefits. If you have not had your medicare wellness exam in the last 12 months, please make certain to schedule one by calling the office and schedule your medicare wellness with Maudie Mercury as soon as possible.   Yearly physical (well visit):  - Adults are recommended to be seen yearly for physicals. Check with your insurance  and date of your last physical, most insurances require one calendar year between physicals. Physicals include all preventive health topics, screenings, medical exam and labs that are appropriate for gender/age and history. You may have fasting labs needed at this visit. This is a well visit (not a sick visit), new problems should not be covered during this visit (see acute visit).  - Pediatric patients are seen more frequently when they are younger. Your provider will advise you on well child visit timing that is appropriate for your their age. - This is not a medicare wellness visit. Medicare wellness exams do not have an exam portion to the visit. Some medicare companies allow for a physical, some do not allow a yearly physical. If your medicare allows a yearly physical you can schedule the medicare wellness with our nurse Maudie Mercury and have your physical with your provider after, on the same day. Please check with insurance for your full benefits.   Late Policy/No Shows:  - all new patients should arrive 15-30 minutes earlier than  appointment to allow [place] time  to  obtain all personal demographics,  insurance information and for you to complete office paperwork. - All established patients should arrive 10-15 minutes earlier than appointment time to update all information and be checked in .  - In our best efforts to run on time, if you are late for your appointment you will be asked to either reschedule or if able, we will work you back into the schedule. There will be a wait time to work you back in the schedule,  depending on availability.  - If you are unable to make it to your appointment as scheduled, please call 24 hours ahead of time to allow [place] to fill the time slot with someone else who needs to be seen. If you do not cancel your appointment ahead of time, you may be charged a no show fee.

## 2017-12-09 ENCOUNTER — Telehealth: Payer: Self-pay | Admitting: Family Medicine

## 2017-12-09 LAB — TSH: TSH: 0.88 u[IU]/mL (ref 0.35–4.50)

## 2017-12-09 NOTE — Telephone Encounter (Signed)
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 No acute other bony abnormality. -We will call her again once all lab results returned.  This may be either tomorrow or Monday depending upon the lab.  Some of the labs we ordered take longer to run. -Until then take the Voltaren every 12 hours.  This could help a great deal surrounding her discomfort with the arthritic/degenerative changes.

## 2017-12-09 NOTE — Telephone Encounter (Signed)
Patient notified and verbalized understanding. 

## 2017-12-10 LAB — EPSTEIN-BARR VIRUS VCA ANTIBODY PANEL
EBV NA IGG: 563 U/mL — AB
EBV VCA IgM: <36 U/mL

## 2017-12-10 LAB — ANTI-NUCLEAR AB-TITER (ANA TITER)

## 2017-12-10 LAB — ANA: Anti Nuclear Antibody(ANA): POSITIVE — AB

## 2017-12-10 LAB — CYCLIC CITRUL PEPTIDE ANTIBODY, IGG

## 2017-12-10 LAB — VITAMIN B12: VITAMIN B 12: 330 pg/mL (ref 200–1100)

## 2017-12-10 LAB — VITAMIN D 25 HYDROXY (VIT D DEFICIENCY, FRACTURES): VIT D 25 HYDROXY: 31 ng/mL (ref 30–100)

## 2017-12-10 LAB — B. BURGDORFI ANTIBODIES: B burgdorferi Ab IgG+IgM: <0.9 index

## 2017-12-10 LAB — SEDIMENTATION RATE: SED RATE: 14 mm/h (ref 0–30)

## 2017-12-10 LAB — RHEUMATOID FACTOR: Rhuematoid fact SerPl-aCnc: <14 IU/mL (ref <14)

## 2017-12-10 LAB — C-REACTIVE PROTEIN: CRP: 2.1 mg/L (ref <8.0)

## 2017-12-13 ENCOUNTER — Telehealth: Payer: Self-pay | Admitting: Family Medicine

## 2017-12-13 ENCOUNTER — Encounter: Payer: Self-pay | Admitting: *Deleted

## 2017-12-13 DIAGNOSIS — R202 Paresthesia of skin: Secondary | ICD-10-CM

## 2017-12-13 DIAGNOSIS — R61 Generalized hyperhidrosis: Secondary | ICD-10-CM

## 2017-12-13 DIAGNOSIS — R768 Other specified abnormal immunological findings in serum: Secondary | ICD-10-CM

## 2017-12-13 DIAGNOSIS — M791 Myalgia, unspecified site: Secondary | ICD-10-CM

## 2017-12-13 DIAGNOSIS — M2550 Pain in unspecified joint: Secondary | ICD-10-CM

## 2017-12-13 DIAGNOSIS — R5383 Other fatigue: Secondary | ICD-10-CM

## 2017-12-13 NOTE — Telephone Encounter (Signed)
Please inform patient the following information: The following labs are normal:    - lyme, thyroid, inflammatory markers, Rheumatoid arthritis factors.  - CMV and EBV current infections are negative. She did show evidence of past EBV (or mono) infection.  Her vit d is low normal at 31--> take a daily vit d 600-1000u daily Her b12 is low normal --> take a daily 500-1044mcg daily.  ANA positive which is used for detecting the  presence of up to approximately 150 autoantibodies in  various autoimmune diseases. A positive ANA  is suggestive of autoimmune disease if titers are high. Her particular titers are moderate which can go either way with autoimmune disorders. Since she is feeling such severe symptoms I have referred her to rheumatology for further evaluation.  In the meantime she should continue to take the prescribed antiinflammatory.  Please make sure the rheumatologist gets a copy of labs.

## 2017-12-13 NOTE — Telephone Encounter (Signed)
Spoke with patient reviewed lab results and instructions. Patient verbalized understanding. 

## 2017-12-15 ENCOUNTER — Encounter: Payer: Self-pay | Admitting: Family Medicine

## 2017-12-15 DIAGNOSIS — R61 Generalized hyperhidrosis: Secondary | ICD-10-CM | POA: Insufficient documentation

## 2017-12-15 DIAGNOSIS — M2550 Pain in unspecified joint: Secondary | ICD-10-CM | POA: Insufficient documentation

## 2017-12-15 DIAGNOSIS — M545 Low back pain, unspecified: Secondary | ICD-10-CM | POA: Insufficient documentation

## 2017-12-15 DIAGNOSIS — M25552 Pain in left hip: Secondary | ICD-10-CM

## 2017-12-15 DIAGNOSIS — M25551 Pain in right hip: Secondary | ICD-10-CM | POA: Insufficient documentation

## 2017-12-15 DIAGNOSIS — R5383 Other fatigue: Secondary | ICD-10-CM | POA: Insufficient documentation

## 2017-12-15 HISTORY — DX: Generalized hyperhidrosis: R61

## 2017-12-31 ENCOUNTER — Ambulatory Visit: Payer: Self-pay | Admitting: *Deleted

## 2017-12-31 NOTE — Telephone Encounter (Signed)
Pt called with having low grade temps since last week. Her t max is 101.2 and that was last night. She is on an anti inflammatory medication that she takes that once a day. She has a hx of having joint pain and has an appointment with a rheumatologist on the 22 nd of June. She denies chest pain, nausea, vomiting or shortness of breath. She did have an episode of diarrhea this morning, she stated after eating corn and taking some miralax. She is also having mild abd pain that feels mostly like gas intermittently. Appointment scheduled per protocol.  Pt advised to call back for increase in symptoms: temp elevations up to 103, headaches, chest pain, shortness of breath and nausea/vomiting. Pt voiced understanding. Will route to flow at [facility].  Reason for Disposition . [1] Intermittent fever > 100.0 F (37.8 C) AND [2] lasts > 3 weeks  Answer Assessment - Initial Assessment Questions 1. TEMPERATURE: "What is the most recent temperature?"  "How was it measured?"      99 and 101.2 last night 2. ONSET: "When did the fever start?"      Last week, 99.5 - 100 3. SYMPTOMS: "Do you have any other symptoms besides the fever?"  (e.g., colds, headache, sore throat, earache, cough, rash, diarrhea, vomiting, abdominal pain)     Mild abd pain that comes and goes, diarrhea 4. CAUSE: If there are no symptoms, ask: "What do you think is causing the fever?"      Not sure 5. CONTACTS: "Does anyone else in the family have an infection?"     no 6. TREATMENT: "What have you done so far to treat this fever?" (e.g., medications)     Taking anti inflammatory medication 7. IMMUNOCOMPROMISE: "Do you have of the following: diabetes, HIV positive, splenectomy, cancer chemotherapy, chronic steroid treatment, transplant patient, etc."     no 8. PREGNANCY: "Is there any chance you are pregnant?" "When was your last menstrual period?"     No periods  Protocols used: FEVER-A-AH

## 2017-12-31 NOTE — Telephone Encounter (Signed)
noted 

## 2018-01-03 ENCOUNTER — Encounter: Payer: Self-pay | Admitting: Family Medicine

## 2018-01-03 ENCOUNTER — Ambulatory Visit: Payer: BC Managed Care – PPO | Admitting: Family Medicine

## 2018-01-03 VITALS — BP 118/75 | HR 63 | Temp 98.4°F | Resp 16 | Ht 66.5 in | Wt 165.0 lb

## 2018-01-03 DIAGNOSIS — R509 Fever, unspecified: Secondary | ICD-10-CM

## 2018-01-03 DIAGNOSIS — R42 Dizziness and giddiness: Secondary | ICD-10-CM

## 2018-01-03 LAB — POC URINALSYSI DIPSTICK (AUTOMATED)
BILIRUBIN UA: NEGATIVE
Blood, UA: NEGATIVE
Glucose, UA: NEGATIVE
KETONES UA: NEGATIVE
Nitrite, UA: NEGATIVE
PH UA: 6 (ref 5.0–8.0)
Protein, UA: NEGATIVE
Spec Grav, UA: >=1.03 — AB (ref 1.010–1.025)
Urobilinogen, UA: 0.2 E.U./dL

## 2018-01-03 NOTE — Progress Notes (Signed)
Brenda Foster , 1965-08-05, 52 y.o., female MRN: [id number] Patient Care Team    Relationship Specialty Notifications Start End  Ma Hillock, DO PCP - General Family Medicine  05/12/16   Dineen Kid, DO Referring Physician   05/12/16    Comment: psych  Servando Salina, MD Consulting Physician Obstetrics and Gynecology  05/12/16     Chief Complaint  Patient presents with  . Fever    chills, hx of low abdominal pain     Subjective: Pt is a 52 y.o. female presents for an OV with  Fever: she states she experienced fever (Tmax 101.2 F), chills and lower abd cramping last wed-fri. She did experience one episode of diarrhea and her stools have been softer since. She denies blood per rectum. She is eating and drinking well. No other family members effected. She has not had fever or chills since Friday. She denies dysuria, but endorses dizziness.    Depression screen [facility] 2/9 08/27/2017 05/12/2016  Decreased Interest 0 0  Down, Depressed, Hopeless 0 0  PHQ - 2 Score 0 0  Altered sleeping 1 -  Tired, decreased energy 0 -  Change in appetite 0 -  Feeling bad or failure about yourself  0 -  Trouble concentrating 1 -  Moving slowly or fidgety/restless 0 -  Suicidal thoughts 0 -  PHQ-9 Score 2 -    Allergies  Allergen Reactions  . Heparin     Do not give per MD  . Sulfa Antibiotics Hives   Social History   Tobacco Use  . Smoking status: Never Smoker  . Smokeless tobacco: Never Used  Substance Use Topics  . Alcohol use: Yes    Alcohol/week: 3.6 oz    Types: 6 Glasses of wine per week   Past Medical History:  Diagnosis Date  . Anxiety   . Depression    Mild n/ infertility  . History of miscarriage   . Infertility   . Interstitial cystitis   . Low back pain   . Migraines   . Sepsis ([place]) 12/2016   in hospital 1 week  . Urinary tract bacterial infections    2x  . Urine incontinence    Mild   Past Surgical History:  Procedure Laterality Date  .  APPENDECTOMY  1980  . BREAST SURGERY  1986   Reduction  . CERVICAL CONE BIOPSY  1990  . CESAREAN SECTION  2003   1 time  . DILATION AND CURETTAGE OF UTERUS  1999-2002  . HYSTEROSCOPY  2001  . LAPAROSCOPY  2001  . TUBAL LIGATION  2003   Family History  Problem Relation Age of Onset  . Cancer Father        prostate  . Heart disease Father 39       MI, premature  . Stroke Father        Mind  . Prostate cancer Father   . Stroke Maternal Grandfather   . Anemia Mother   . Autism spectrum disorder Daughter   . Stroke Maternal Grandmother   . Colon cancer Neg Hx   . Esophageal cancer Neg Hx   . Liver cancer Neg Hx   . Pancreatic cancer Neg Hx   . Rectal cancer Neg Hx   . Stomach cancer Neg Hx    Allergies as of 01/03/2018      Reactions   Heparin    Do not give per MD   Sulfa Antibiotics Hives  Medication List        Accurate as of 01/03/18  9:06 AM. Always use your most recent med list.          CALCIUM-VITAMIN D PO Take by mouth. Take 2 pills daily   diclofenac 75 MG EC tablet Commonly known as:  VOLTAREN Take 1 tablet (75 mg total) by mouth 2 (two) times daily.   FLUoxetine 20 MG tablet Commonly known as:  PROZAC Take 1.5 tablets (30 mg total) by mouth daily.   multivitamin tablet Take 1 tablet by mouth daily.   propranolol 10 MG tablet Commonly known as:  INDERAL Take 1 tablet (10 mg total) by mouth 2 (two) times daily.   TART CHERRY ADVANCED PO Take by mouth daily.   vitamin B-12 500 MCG tablet Commonly known as:  CYANOCOBALAMIN Take 500 mcg by mouth daily.       All past medical history, surgical history, allergies, family history, immunizations andmedications were updated in the EMR today and reviewed under the history and medication portions of their EMR.     ROS: Negative, with the exception of above mentioned in HPI   Objective:  BP 118/75 (BP Location: Left Arm, Patient Position: Sitting, Cuff Size: Normal)   Pulse 63   Temp 98.4  F (36.9 C) (Oral)   Resp 16   Ht 5' 6.5" (1.689 m)   Wt 165 lb (74.8 kg)   LMP 01/20/2016 (Approximate)   SpO2 99%   BMI 26.23 kg/m  Body mass index is 26.23 kg/m.  Gen: Afebrile. No acute distress. Nontoxic in appearance, well developed, well nourished.  HENT: AT. [place].  MMM.  Eyes:Pupils Equal Round Reactive to light, Extraocular movements intact,  Conjunctiva without redness, discharge or icterus. Neck/lymp/endocrine: Supple,no lymphadenopathy, no thyromegaly CV: RRR no murmur, no edema, +2/4 P posterior tibialis pulses Chest: CTAB, no wheeze or crackles Abd: Soft. flat. NTND. BS present. no Masses palpated.  MSK: no CVA tenderness.  Skin: no rashes, purpura or petechiae.  Neuro:  Normal gait. PERLA. EOMi. Alert. Oriented x3  No exam data present No results found. Results for orders placed or performed in visit on 01/03/18 (from the past 24 hour(s))  POCT Urinalysis Dipstick (Automated)     Status: Abnormal   Collection Time: 01/03/18  9:03 AM  Result Value Ref Range   Color, UA yellow    Clarity, UA cloudy    Glucose, UA Negative Negative   Bilirubin, UA negative    Ketones, UA negative    Spec Grav, UA >=1.030 (A) 1.010 - 1.025   Blood, UA negative    pH, UA 6.0 5.0 - 8.0   Protein, UA Negative Negative   Urobilinogen, UA 0.2 0.2 or 1.0 E.U./dL   Nitrite, UA negative    Leukocytes, UA Trace (A) Negative    Assessment/Plan: Christyana Corwin is a 52 y.o. female present for OV for  Fever, unspecified fever cause/dizziness/gastroenteritis - Likely GI virus, resolving symptoms. -  hydrate, try G2 for electrolyte replacement with diarrhea.  - Restart probiotic.  - POCT Urinalysis Dipstick (Automated)--> trace leuks only--> urine culture to be complete.  - F/U PRN     Reviewed expectations re: course of current medical issues.  Discussed self-management of symptoms.  Outlined signs and symptoms indicating need for more acute intervention.  Patient verbalized  understanding and all questions were answered.  Patient received an After-Visit Summary.    Orders Placed This Encounter  Procedures  . POCT Urinalysis Dipstick (Automated)  Note is dictated utilizing voice recognition software. Although note has been proof read prior to signing, occasional typographical errors still can be missed. If any questions arise, please do not hesitate to call for verification.   electronically signed by:  Howard Pouch, DO  [place]

## 2018-01-03 NOTE — Patient Instructions (Addendum)
- hydrate, try G2 for electrolyte replacement with diarrhea.  - Restart probiotic.  - urine looked ok, sent for culture to be certain   I believe you had a GI virus.    Food Choices to Help Relieve Diarrhea, Adult When you have diarrhea, the foods you eat and your eating habits are very important. Choosing the right foods and drinks can help:  Relieve diarrhea.  Replace lost fluids and nutrients.  Prevent dehydration.  What general guidelines should I follow? Relieving diarrhea  Choose foods with less than 2 g or .07 oz. of fiber per serving.  Limit fats to less than 8 tsp (38 g or 1.34 oz.) a day.  Avoid the following: ? Foods and beverages sweetened with high-fructose corn syrup, honey, or sugar alcohols such as xylitol, sorbitol, and mannitol. ? Foods that contain a lot of fat or sugar. ? Fried, greasy, or spicy foods. ? High-fiber grains, breads, and cereals. ? Raw fruits and vegetables.  Eat foods that are rich in probiotics. These foods include dairy products such as yogurt and fermented milk products. They help increase healthy bacteria in the stomach and intestines (gastrointestinal tract, or GI tract).  If you have lactose intolerance, avoid dairy products. These may make your diarrhea worse.  Take medicine to help stop diarrhea (antidiarrheal medicine) only as told by your health care provider. Replacing nutrients  Eat small meals or snacks every 3-4 hours.  Eat bland foods, such as white rice, toast, or baked potato, until your diarrhea starts to get better. Gradually reintroduce nutrient-rich foods as tolerated or as told by your health care provider. This includes: ? Well-cooked protein foods. ? Peeled, seeded, and soft-cooked fruits and vegetables. ? Low-fat dairy products.  Take vitamin and mineral supplements as told by your health care provider. Preventing dehydration   Start by sipping water or a special solution to prevent dehydration (oral  rehydration solution, ORS). Urine that is clear or pale yellow means that you are getting enough fluid.  Try to drink at least 8-10 cups of fluid each day to help replace lost fluids.  You may add other liquids in addition to water, such as clear juice or decaffeinated sports drinks, as tolerated or as told by your health care provider.  Avoid drinks with caffeine, such as coffee, tea, or soft drinks.  Avoid alcohol. What foods are recommended? The items listed may not be a complete list. Talk with your health care provider about what dietary choices are best for you. Grains White rice. White, [place], or pita breads (fresh or toasted), including plain rolls, buns, or bagels. White pasta. Saltine, soda, or graham crackers. Pretzels. Low-fiber cereal. Cooked cereals made with water (such as cornmeal, farina, or cream cereals). Plain muffins. Matzo. Melba toast. Zwieback. Vegetables Potatoes (without the skin). Most well-cooked and canned vegetables without skins or seeds. Tender lettuce. Fruits Apple sauce. Fruits canned in juice. Cooked apricots, cherries, grapefruit, peaches, pears, or plums. Fresh bananas and cantaloupe. Meats and other protein foods Baked or boiled chicken. Eggs. Tofu. Fish. Seafood. Smooth nut butters. Ground or well-cooked tender beef, ham, veal, lamb, pork, or poultry. Dairy Plain yogurt, kefir, and unsweetened liquid yogurt. Lactose-free milk, buttermilk, skim milk, or soy milk. Low-fat or nonfat hard cheese. Beverages Water. Low-calorie sports drinks. Fruit juices without pulp. Strained tomato and vegetable juices. Decaffeinated teas. Sugar-free beverages not sweetened with sugar alcohols. Oral rehydration solutions, if approved by your health care provider. Seasoning and other foods Bouillon, broth, or soups made  from recommended foods. What foods are not recommended? The items listed may not be a complete list. Talk with your health care provider about what dietary  choices are best for you. Grains Whole grain, whole wheat, bran, or rye breads, rolls, pastas, and crackers. Wild or brown rice. Whole grain or bran cereals. Barley. Oats and oatmeal. Corn tortillas or taco shells. Granola. Popcorn. Vegetables Raw vegetables. Fried vegetables. Cabbage, broccoli, Brussels sprouts, artichokes, baked beans, beet greens, corn, kale, legumes, peas, sweet potatoes, and yams. Potato skins. Cooked spinach and cabbage. Fruits Dried fruit, including raisins and dates. Raw fruits. Stewed or dried prunes. Canned fruits with syrup. Meat and other protein foods Fried or fatty meats. Deli meats. Chunky nut butters. Nuts and seeds. Beans and lentils. Berniece Salines. Hot dogs. Sausage. Dairy High-fat cheeses. Whole milk, chocolate milk, and beverages made with milk, such as milk shakes. Half-and-half. Cream. sour cream. Ice cream. Beverages Caffeinated beverages (such as coffee, tea, soda, or energy drinks). Alcoholic beverages. Fruit juices with pulp. Prune juice. Soft drinks sweetened with high-fructose corn syrup or sugar alcohols. High-calorie sports drinks. Fats and oils Butter. Cream sauces. Margarine. Salad oils. Plain salad dressings. Olives. Avocados. Mayonnaise. Sweets and desserts Sweet rolls, doughnuts, and sweet breads. Sugar-free desserts sweetened with sugar alcohols such as xylitol and sorbitol. Seasoning and other foods Honey. Hot sauce. Chili powder. Gravy. Cream-based or milk-based soups. Pancakes and waffles. Summary  When you have diarrhea, the foods you eat and your eating habits are very important.  Make sure you get at least 8-10 cups of fluid each day, or enough to keep your urine clear or pale yellow.  Eat bland foods and gradually reintroduce healthy, nutrient-rich foods as tolerated, or as told by your health care provider.  Avoid high-fiber, fried, greasy, or spicy foods. This information is not intended to replace advice given to you by your health  care provider. Make sure you discuss any questions you have with your health care provider. Document Released: 08/29/2003 Document Revised: 06/05/2016 Document Reviewed: 06/05/2016 Elsevier Interactive Patient Education  [employer].

## 2018-01-04 LAB — URINE CULTURE
MICRO NUMBER:: 90835061
SPECIMEN QUALITY: ADEQUATE

## 2018-01-18 ENCOUNTER — Encounter: Payer: Self-pay | Admitting: Family Medicine

## 2018-02-15 ENCOUNTER — Other Ambulatory Visit: Payer: Self-pay | Admitting: *Deleted

## 2018-02-15 ENCOUNTER — Encounter: Payer: Self-pay | Admitting: *Deleted

## 2018-02-15 MED ORDER — FLUOXETINE HCL 20 MG PO TABS
30.0000 mg | ORAL_TABLET | Freq: Every day | ORAL | 0 refills | Status: DC
Start: 2018-02-15 — End: 2018-08-01

## 2018-03-01 ENCOUNTER — Other Ambulatory Visit: Payer: Self-pay

## 2018-03-01 MED ORDER — PROPRANOLOL HCL 10 MG PO TABS: 10.0000 mg | ORAL_TABLET | Freq: Two times a day (BID) | ORAL | 0 refills | Status: DC

## 2018-04-04 ENCOUNTER — Other Ambulatory Visit: Payer: Self-pay | Admitting: *Deleted

## 2018-04-04 MED ORDER — PROPRANOLOL HCL 10 MG PO TABS: 10.0000 mg | ORAL_TABLET | Freq: Two times a day (BID) | ORAL | 0 refills | Status: DC

## 2018-05-03 ENCOUNTER — Other Ambulatory Visit: Payer: Self-pay | Admitting: *Deleted

## 2018-05-03 MED ORDER — PROPRANOLOL HCL 10 MG PO TABS: 10.0000 mg | ORAL_TABLET | Freq: Two times a day (BID) | ORAL | 0 refills | Status: DC

## 2018-06-07 ENCOUNTER — Other Ambulatory Visit: Payer: Self-pay

## 2018-06-07 DIAGNOSIS — M2550 Pain in unspecified joint: Secondary | ICD-10-CM

## 2018-06-07 MED ORDER — DICLOFENAC SODIUM 75 MG PO TBEC: 75.0000 mg | DELAYED_RELEASE_TABLET | Freq: Two times a day (BID) | ORAL | 0 refills | Status: DC

## 2018-06-07 NOTE — Telephone Encounter (Signed)
Pt pharmacy [facility] requesting rf on Diclofenac. rx rfed and sent to pharmacy.

## 2018-08-01 ENCOUNTER — Ambulatory Visit: Payer: BC Managed Care – PPO | Admitting: Family Medicine

## 2018-08-01 ENCOUNTER — Encounter: Payer: Self-pay | Admitting: Family Medicine

## 2018-08-01 VITALS — BP 94/66 | HR 53 | Temp 98.8°F | Resp 16 | Ht 67.72 in | Wt 169.0 lb

## 2018-08-01 DIAGNOSIS — F418 Other specified anxiety disorders: Secondary | ICD-10-CM | POA: Diagnosis not present

## 2018-08-01 DIAGNOSIS — R251 Tremor, unspecified: Secondary | ICD-10-CM

## 2018-08-01 DIAGNOSIS — M545 Low back pain, unspecified: Secondary | ICD-10-CM

## 2018-08-01 DIAGNOSIS — M2550 Pain in unspecified joint: Secondary | ICD-10-CM

## 2018-08-01 MED ORDER — FLUOXETINE HCL 20 MG PO TABS: 30.0000 mg | ORAL_TABLET | Freq: Every day | ORAL | 1 refills | Status: DC

## 2018-08-01 MED ORDER — PROPRANOLOL HCL 10 MG PO TABS: 10.0000 mg | ORAL_TABLET | Freq: Two times a day (BID) | ORAL | 1 refills | Status: DC

## 2018-08-01 MED ORDER — DICLOFENAC SODIUM 75 MG PO TBEC: 75.0000 mg | DELAYED_RELEASE_TABLET | Freq: Two times a day (BID) | ORAL | 1 refills | Status: DC

## 2018-08-01 NOTE — Progress Notes (Signed)
Brenda Foster , [birth year], 53 y.o., female MRN: [id number] Patient Care Team    Relationship Specialty Notifications Start End  Ma Hillock, DO PCP - General Family Medicine  05/12/16   Dineen Kid, DO Referring Physician   05/12/16    Comment: psych  Servando Salina, MD Consulting Physician Obstetrics and Gynecology  05/12/16     Chief Complaint  Patient presents with  . Anxiety    No complaints.   . Joint Pain    Still having joint and lower back pain but is not as bad as it was      Subjective:  Brenda Foster is a 53 y.o. female present for  Follow up on   Anxiety/tremor:.  Patient reports she has continued to take 30 mg of Prozac daily.  She states she was taking her daughter's prescriptions and she was no longer using it.  She reports she is doing well from the anxiety standpoint.  She also continues to take the propranolol 10 mg twice daily Prior note:  Pt presents for an OV with complaints of anxiety and tremor from prozac of 1.5 year duration.  Condition is well controlled on prozac 30 mg and Propanolol 10 mg BID. Her prior psychiatric provider has moved and she would like to come here for her care surrounding this issue.   Polyarthralgia:  Patient reports her lumbar back, hips and knees are much improved on the Voltaren twice daily.  However she still complains of increased lower back pain after sitting for long periods of time.  She reports pain in the middle the night in her knees and hips.  She reports the pain is improved when she is moving around.  She had a weak positive ANA and was referred to rheumatology, in which she did not have a good experience and they did not feel her symptoms were related to a autoimmune disorder.  MRI 2018 report below.  Lumbar x-ray June 2019 with degenerative disc and facet disease.  No acute bony abnormality.  Degenerative disc most notable at L4-5 with disc space narrowing and spurring. He is frustrated with her  continued daily pain.  She would like to seek orthopedics referral for further evaluation.  Prior note 12/08/2017: Pt is a 53 y.o. female presents for an OV with multiple complaints today.  She reports she just does not feel her normal self any longer.  She has been slowly progressing and her symptoms since last fall.  She has multiple arthralgia complaints of bilateral hips, bilateral knees and lower back.  She reports she has been seen by spine specialist since January.  She has been performing physical therapy since then.  She feels her back discomfort has worsened with standing.  She reports she has a stabbing pain when she stands.  She has radiating pain to her bilateral hips.  She has started using a standing desk at work to help prevent the symptoms.  She reports sometimes it feels better when walking.  She walks a couple miles a day to help with relief.  Reports she is doing very well on physical therapy and they comment on her range of motion being so great.  He has been tried on Mobic and she did not feel that was very helpful.  She also feels like she is in a "brain fog".  Has decreased energy.  She is worried she has Lyme disease.  She became rather ill in July of last year was hospitalized.  She had  an infectious disease work-up at that time which resulted with negative Lyme titers, negative hepatitis hepatitis, negative [place] spotted fever titers, negative E. Chaffeensis titers, CMV IgM was negative at that time.  Negative for heavy metals lead/mercury also in July 2018.   MRI August 22, 2016 is altered with L1-L2 mild disc narrowing and desiccation with central endplate spurring.  L3-L4 mild far lateral disc bulging and facet spurring.  L4-L5 mild disc narrowing and desiccation.  Disc bulging eccentric to the right far lateral region.  Annular fissure noticed posteriorly.  Mild facet spurring.  L5-S1 mild disc narrowing and bulging with tiny central protrusion.  Facet arthropathy with mild  spurring.  She does not have a family history of arthritis. Depression screen [facility] 2/9 08/27/2017 05/12/2016  Decreased Interest 0 0  Down, Depressed, Hopeless 0 0  PHQ - 2 Score 0 0  Altered sleeping 1 -  Tired, decreased energy 0 -  Change in appetite 0 -  Feeling bad or failure about yourself  0 -  Trouble concentrating 1 -  Moving slowly or fidgety/restless 0 -  Suicidal thoughts 0 -  PHQ-9 Score 2 -    Allergies  Allergen Reactions  . Heparin     Do not give per MD  . Sulfa Antibiotics Hives   Social History   Tobacco Use  . Smoking status: Never Smoker  . Smokeless tobacco: Never Used  Substance Use Topics  . Alcohol use: Yes    Alcohol/week: 6.0 standard drinks    Types: 6 Glasses of wine per week   Past Medical History:  Diagnosis Date  . Anxiety   . Depression    Mild n/ infertility  . History of miscarriage   . Infertility   . Interstitial cystitis   . Low back pain   . Migraines   . Sepsis ([place]) 12/2016   in hospital 1 week  . Urinary tract bacterial infections    2x  . Urine incontinence    Mild   Past Surgical History:  Procedure Laterality Date  . APPENDECTOMY  1980  . BREAST SURGERY  1986   Reduction  . CERVICAL CONE BIOPSY  1990  . CESAREAN SECTION  2003   1 time  . DILATION AND CURETTAGE OF UTERUS  1999-2002  . HYSTEROSCOPY  2001  . LAPAROSCOPY  2001  . TUBAL LIGATION  2003   Family History  Problem Relation Age of Onset  . Cancer Father        prostate  . Heart disease Father 61       MI, premature  . Stroke Father        Mind  . Prostate cancer Father   . Stroke Maternal Grandfather   . Anemia Mother   . Autism spectrum disorder Daughter   . Stroke Maternal Grandmother   . Colon cancer Neg Hx   . Esophageal cancer Neg Hx   . Liver cancer Neg Hx   . Pancreatic cancer Neg Hx   . Rectal cancer Neg Hx   . Stomach cancer Neg Hx    Allergies as of 08/01/2018      Reactions   Heparin    Do not give per MD   Sulfa Antibiotics  Hives      Medication List       Accurate as of August 01, 2018  9:14 AM. Always use your most recent med list.        CALCIUM-VITAMIN D PO Take by mouth.  Take 2 pills daily   diclofenac 75 MG EC tablet Commonly known as:  VOLTAREN Take 1 tablet (75 mg total) by mouth 2 (two) times daily.   fluorometholone 0.1 % ophthalmic suspension Commonly known as:  FML   FLUoxetine 20 MG tablet Commonly known as:  PROZAC Take 1.5 tablets (30 mg total) by mouth daily. Needs office visit prior to any additional refills   metroNIDAZOLE 0.75 % cream Commonly known as:  METROCREAM   multivitamin tablet Take 1 tablet by mouth daily.   propranolol 10 MG tablet Commonly known as:  INDERAL Take 1 tablet (10 mg total) by mouth 2 (two) times daily. Needs appointment for additional refills.   TART CHERRY ADVANCED PO Take by mouth daily.   vitamin B-12 500 MCG tablet Commonly known as:  CYANOCOBALAMIN Take 500 mcg by mouth daily.       All past medical history, surgical history, allergies, family history, immunizations andmedications were updated in the EMR today and reviewed under the history and medication portions of their EMR.     ROS: Negative, with the exception of above mentioned in HPI   Objective:  BP 94/66 (BP Location: Right Arm, Patient Position: Sitting, Cuff Size: Normal)   Pulse (!) 53   Temp 98.8 F (37.1 C) (Oral)   Resp 16   Ht 5' 7.72" (1.72 m)   Wt 169 lb (76.7 kg)   LMP 01/20/2016 (Approximate)   SpO2 100%   BMI 25.91 kg/m  Body mass index is 25.91 kg/m. Gen: Afebrile. No acute distress.  Nontoxic in presentation.  Caucasian female. HENT: AT. [place].  MMM.  Eyes:Pupils Equal Round Reactive to light, Extraocular movements intact,  Conjunctiva without redness, discharge or icterus. CV: RRR no murmur, no edema, +2/4 P posterior tibialis pulses Chest: CTAB, no wheeze or crackles Skin: no rashes, purpura or petechiae.  Neuro:  Normal gait. PERLA. EOMi. Alert.  Oriented x3  Psych: Normal affect, dress and demeanor. Normal speech. Normal thought content and judgment.    No exam data present No results found. No results found for this or any previous visit (from the past 24 hour(s)).  Assessment/Plan: Jensen Cheramie is a 53 y.o. female present for OV for  Situational anxiety Tremor -[place]ble.  Continue Prozac 30 mg QD and Propanol 10 mg BID (tremor 2/2 to prozac). If she decides she would like therapy, she can call in anytime and I will be happy to place referral for her to Dr. Mamie Levers.  -Follow-up 6 months  Polyarthralgia:   - pt. worried about Anklosing spondylosis as a potential diagnosis. Mostly, she would like better control of her pain. She is not desiring to start narcotic.  Does have MRI changes in 2018 suggestive of at least mild spurring of the lumbar spine and x-ray 2019 with degenerative changes.  Will refer to orthopedics to consider further eval and management, she would be interested in injections if they felt would be helpful. - ortho referral made.  - refills on Voltaren.  - f/u PRN   Reviewed expectations re: course of current medical issues.  Discussed self-management of symptoms.  Outlined signs and symptoms indicating need for more acute intervention.  Patient verbalized understanding and all questions were answered.  Patient received an After-Visit Summary.    No orders of the defined types were placed in this encounter.  > 25 minutes spent with patient, >50% of time spent face to face counseling     Note is dictated utilizing voice recognition software. Although  note has been proof read prior to signing, occasional typographical errors still can be missed. If any questions arise, please do not hesitate to call for verification.   electronically signed by:  Howard Pouch, DO  [place]

## 2018-08-01 NOTE — Patient Instructions (Addendum)
I have refilled your meds for you.   I have referred you to ortho for your chronic pains.   Follow up in 6 months  Please help [place] help you:  We are honored you have chosen [facility] for your Primary Care home. Below you will find basic instructions that you may need to access in the future. Please help [place] help you by reading the instructions, which cover many of the frequent questions we experience.   Prescription refills and request:  -In order to allow more efficient response time, please call your pharmacy for all refills. They will forward the request electronically to [place]. This allows for the quickest possible response. Request left on a nurse line can take longer to refill, since these are checked as time allows between office patients and other phone calls.  - refill request can take up to 3-5 working days to complete.  - If request is sent electronically and request is appropiate, it is usually completed in 1-2 business days.  - all patients will need to be seen routinely for all chronic medical conditions requiring prescription medications (see follow-up below). If you are overdue for follow up on your condition, you will be asked to make an appointment and we will call in enough medication to cover you until your appointment (up to 30 days).  - all controlled substances will require a face to face visit to request/refill.  - if you desire your prescriptions to go through a new pharmacy, and have an active script at original pharmacy, you will need to call your pharmacy and have scripts transferred to new pharmacy. This is completed between the pharmacy locations and not by your provider.    Results: If any images or labs were ordered, it can take up to 1 week to get results depending on the test ordered and the lab/facility running and resulting the test. - Normal or stable results, which do not need further discussion, may be released to your mychart immediately with attached  note to you. A call may not be generated for normal results. Please make certain to sign up for mychart. If you have questions on how to activate your mychart you can call the front office.  - If your results need further discussion, our office will attempt to contact you via phone, and if unable to reach you after 2 attempts, we will release your abnormal result to your mychart with instructions.  - All results will be automatically released in mychart after 1 week.  - Your provider will provide you with explanation and instruction on all relevant material in your results. Please keep in mind, results and labs may appear confusing or abnormal to the untrained eye, but it does not mean they are actually abnormal for you personally. If you have any questions about your results that are not covered, or you desire more detailed explanation than what was provided, you should make an appointment with your provider to do so.   Our office handles many outgoing and incoming calls daily. If we have not contacted you within 1 week about your results, please check your mychart to see if there is a message first and if not, then contact our office.  In helping with this matter, you help decrease call volume, and therefore allow [place] to be able to respond to patients needs more efficiently.   Acute office visits (sick visit):  An acute visit is intended for a new problem and are scheduled in shorter  time slots to allow schedule openings for patients with new problems. This is the appropriate visit to discuss a new problem. Problems will not be addressed by phone call or Echart message. Appointment is needed if requesting treatment. In order to provide you with excellent quality medical care with proper time for you to explain your problem, have an exam and receive treatment with instructions, these appointments should be limited to one new problem per visit. If you experience a new problem, in which you desire to be  addressed, please make an acute office visit, we save openings on the schedule to accommodate you. Please do not save your new problem for any other type of visit, let us take care of it properly and quickly for you.   Follow up visits:  Depending on your condition(s) your provider will need to see you routinely in order to provide you with quality care and prescribe medication(s). Most chronic conditions (Example: hypertension, Diabetes, depression/anxiety... etc), require visits a couple times a year. Your provider will instruct you on proper follow up for your personal medical conditions and history. Please make certain to make follow up appointments for your condition as instructed. Failing to do so could result in lapse in your medication treatment/refills. If you request a refill, and are overdue to be seen on a condition, we will always provide you with a 30 day script (once) to allow you time to schedule.    Medicare wellness (well visit): - we have a wonderful Nurse Maudie Mercury), that will meet with you and provide you will yearly medicare wellness visits. These visits should occur yearly (can not be scheduled less than 1 calendar year apart) and cover preventive health, immunizations, advance directives and screenings you are entitled to yearly through your medicare benefits. Do not miss out on your entitled benefits, this is when medicare will pay for these benefits to be ordered for you.  These are strongly encouraged by your provider and is the appropriate type of visit to make certain you are up to date with all preventive health benefits. If you have not had your medicare wellness exam in the last 12 months, please make certain to schedule one by calling the office and schedule your medicare wellness with Maudie Mercury as soon as possible.   Yearly physical (well visit):  - Adults are recommended to be seen yearly for physicals. Check with your insurance and date of your last physical, most insurances  require one calendar year between physicals. Physicals include all preventive health topics, screenings, medical exam and labs that are appropriate for gender/age and history. You may have fasting labs needed at this visit. This is a well visit (not a sick visit), new problems should not be covered during this visit (see acute visit).  - Pediatric patients are seen more frequently when they are younger. Your provider will advise you on well child visit timing that is appropriate for your their age. - This is not a medicare wellness visit. Medicare wellness exams do not have an exam portion to the visit. Some medicare companies allow for a physical, some do not allow a yearly physical. If your medicare allows a yearly physical you can schedule the medicare wellness with our nurse Maudie Mercury and have your physical with your provider after, on the same day. Please check with insurance for your full benefits.   Late Policy/No Shows:  - all new patients should arrive 15-30 minutes earlier than appointment to allow [place] time  to  obtain all  personal demographics,  insurance information and for you to complete office paperwork. - All established patients should arrive 10-15 minutes earlier than appointment time to update all information and be checked in .  - In our best efforts to run on time, if you are late for your appointment you will be asked to either reschedule or if able, we will work you back into the schedule. There will be a wait time to work you back in the schedule,  depending on availability.  - If you are unable to make it to your appointment as scheduled, please call 24 hours ahead of time to allow [place] to fill the time slot with someone else who needs to be seen. If you do not cancel your appointment ahead of time, you may be charged a no show fee.

## 2018-08-11 ENCOUNTER — Encounter: Payer: Self-pay | Admitting: Family Medicine

## 2018-08-23 ENCOUNTER — Other Ambulatory Visit: Payer: Self-pay | Admitting: Physical Medicine and Rehabilitation

## 2018-08-23 DIAGNOSIS — R93421 Abnormal radiologic findings on diagnostic imaging of right kidney: Secondary | ICD-10-CM

## 2018-08-24 ENCOUNTER — Ambulatory Visit
Admission: RE | Admit: 2018-08-24 | Discharge: 2018-08-24 | Disposition: A | Discharge status: Home / Self Care | Payer: BC Managed Care – PPO | Source: Ambulatory Visit | Attending: Physical Medicine and Rehabilitation | Admitting: Physical Medicine and Rehabilitation

## 2018-08-24 DIAGNOSIS — R93421 Abnormal radiologic findings on diagnostic imaging of right kidney: Secondary | ICD-10-CM

## 2018-09-07 LAB — RESULTS CONSOLE HPV: CHL HPV: NEGATIVE

## 2018-09-10 IMAGING — CT CT HEAD W/O CM
4 series · 16 of 47 positions shown, 18 images · non-contrast
Comparison: None.

CLINICAL DATA: Fever of unknown origin.  Headaches.

EXAM:
CT HEAD WITHOUT CONTRAST
TECHNIQUE: Contiguous axial images were obtained from the base of the skull
through the vertex without intravenous contrast.

[Series 3: head without · axial · non-contrast · 0.43mm/px · z∈[-122,-2]mm · 7 of 33 slices shown, 9 images]
[im 5/33  brain]
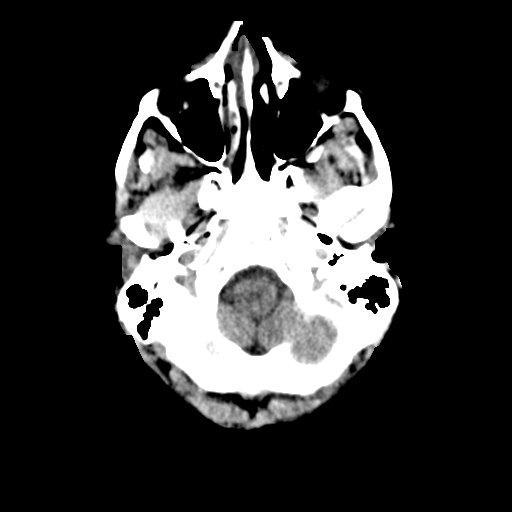
[im 5/33  bone]
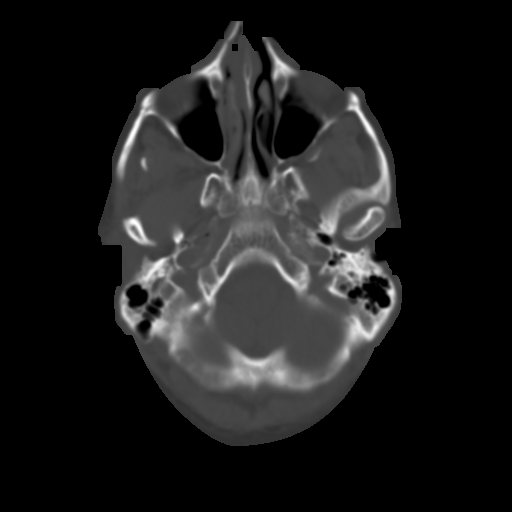
[im 9/33  brain]
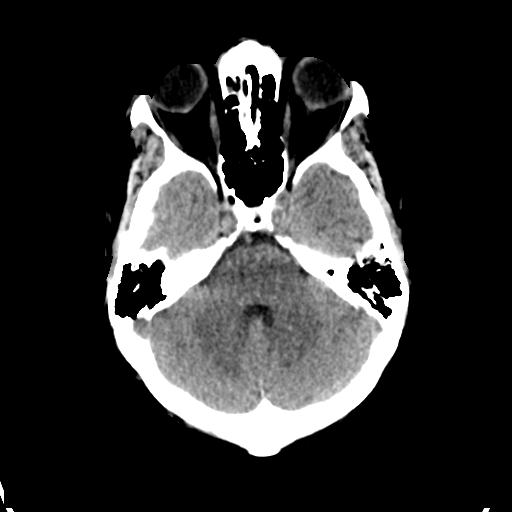
[im 13/33  brain]
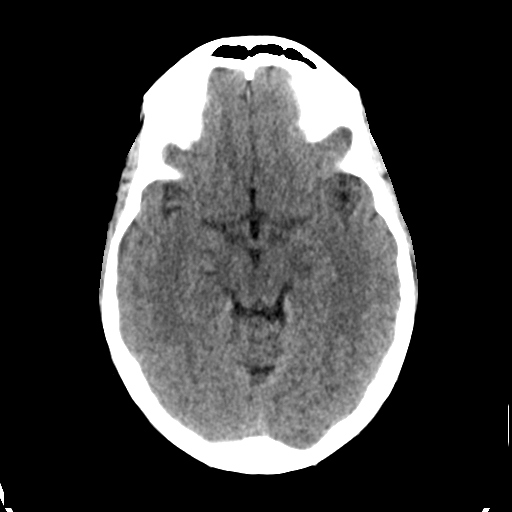
[im 17/33  brain]
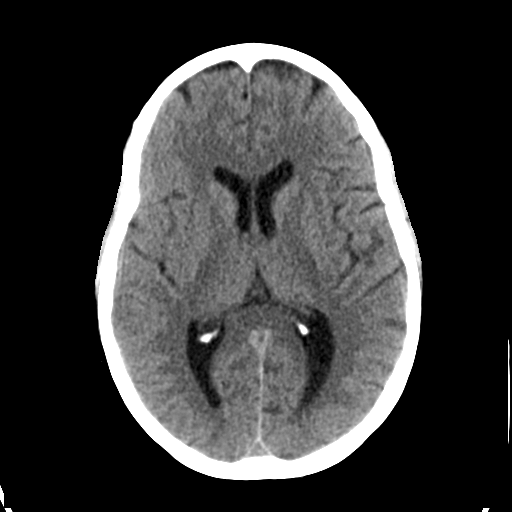
[im 21/33  brain]
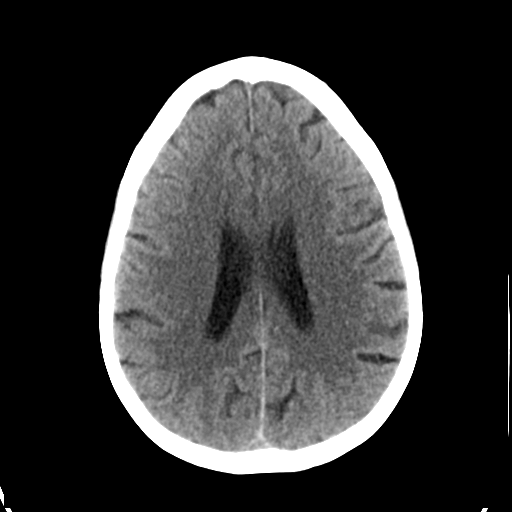
[im 21/33  bone]
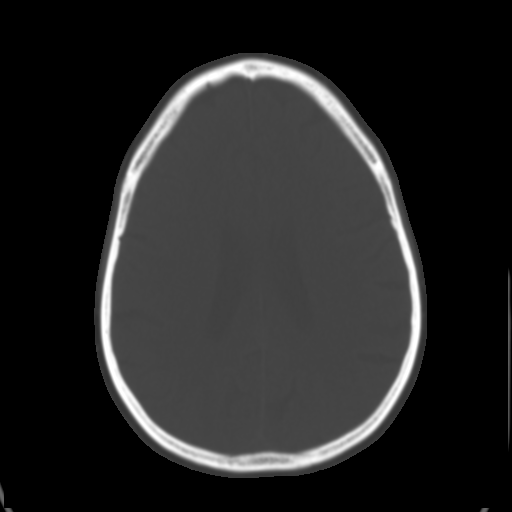
[im 25/33  brain]
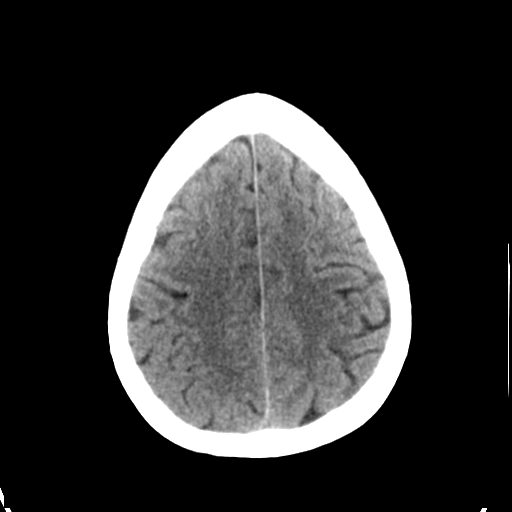
[im 29/33  brain]
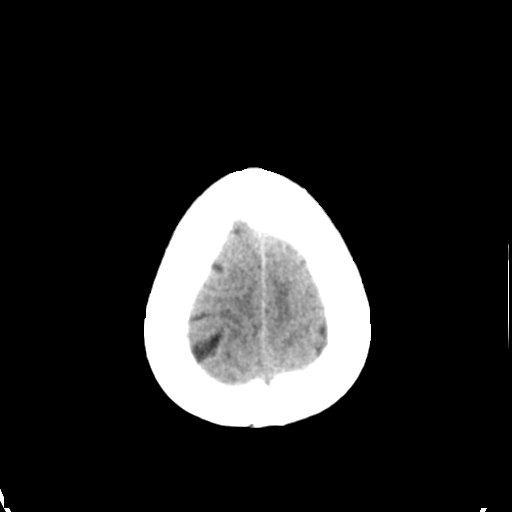

[Series 4: head bone · axial · 0.43mm/px · z∈[-126,-94]mm · 3 of 82 slices shown]
[im 9/82  bone]
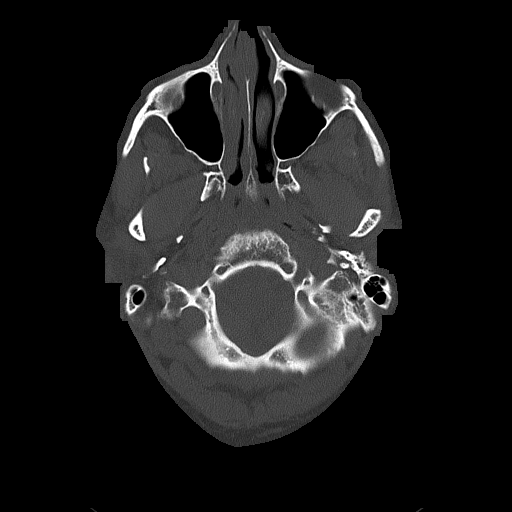
[im 17/82  bone]
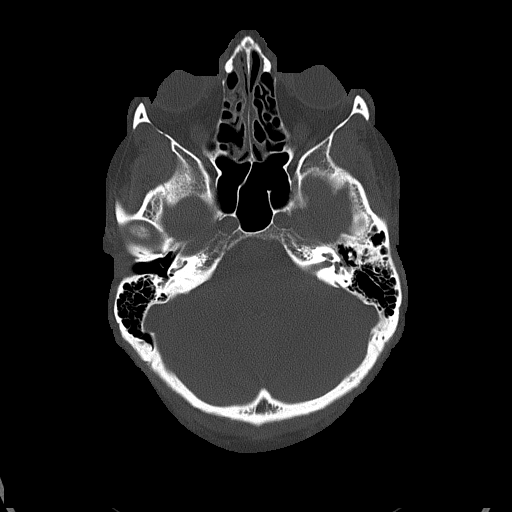
[im 25/82  bone]
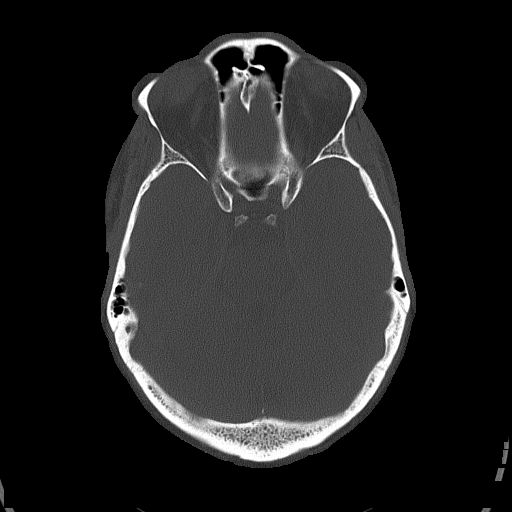

[Series 5: head without cor · coronal · non-contrast · 0.33mm/px · 3 of 70 slices shown]
[im 24/70  brain]
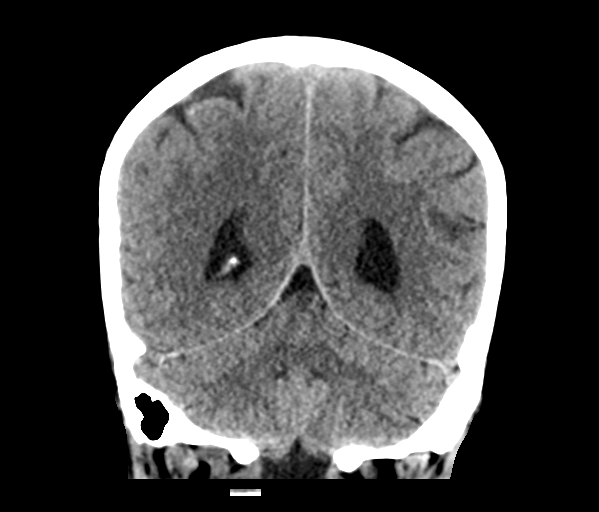
[im 31/70  brain]
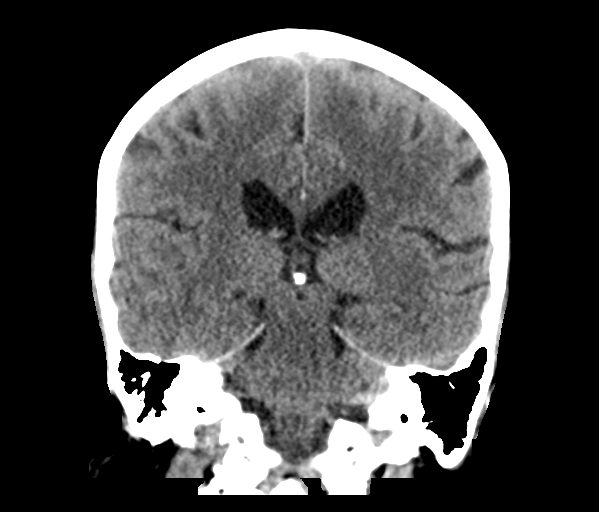
[im 39/70  brain]
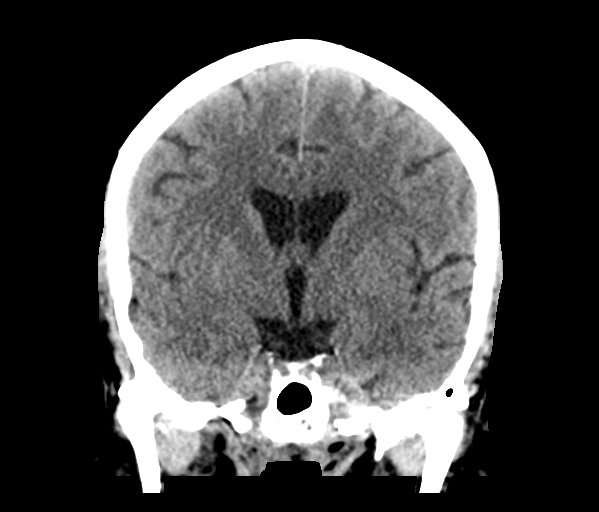

[Series 6: head without sag · sagittal · non-contrast · 0.32mm/px · 3 of 65 slices shown]
[im 22/65  brain]
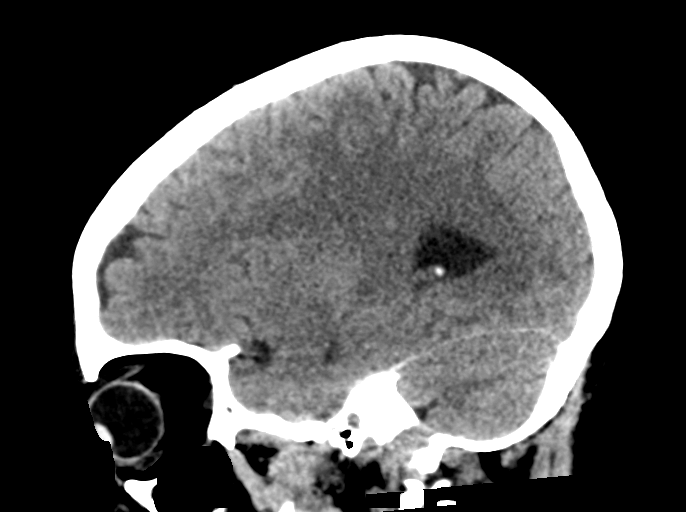
[im 33/65  brain]
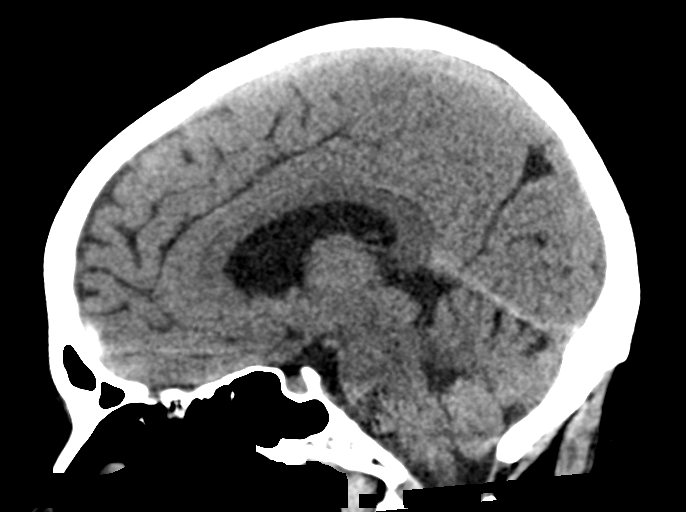
[im 43/65  brain]
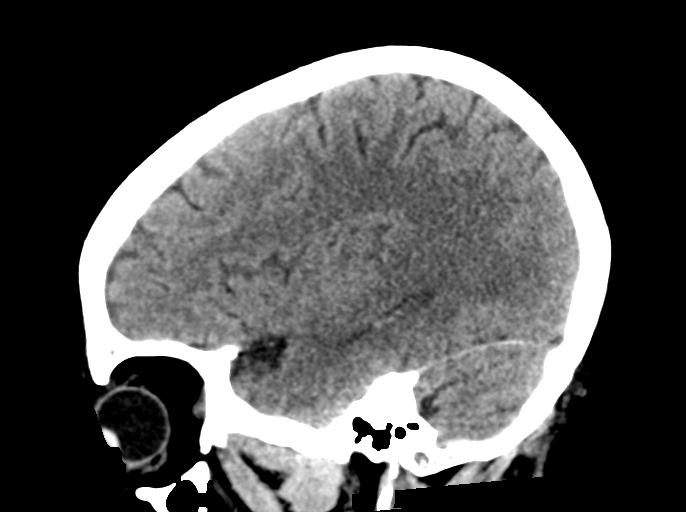

[16 of 47 positions shown; findings below may reference images not displayed]

FINDINGS: Brain: Ventricles are normal in size and configuration. All areas of
the brain demonstrate normal gray-white matter attenuation. There is
no mass, hemorrhage, edema or other evidence of acute parenchymal
abnormality. No extra-axial hemorrhage.

Vascular: No hyperdense vessel or unexpected calcification.

Skull: Normal. Negative for fracture or focal lesion.

Sinuses/Orbits: No acute finding.

Other: None.
IMPRESSION: Negative head CT.  No intracranial mass, hemorrhage or edema.

## 2018-09-22 IMAGING — DX DG CHEST 2V
2 series · 2 of 2 positions shown · non-contrast
Comparison: 12/07/2016

CLINICAL DATA: Shortness of breath since [REDACTED]

EXAM:
CHEST  2 VIEW

[w chest pa]
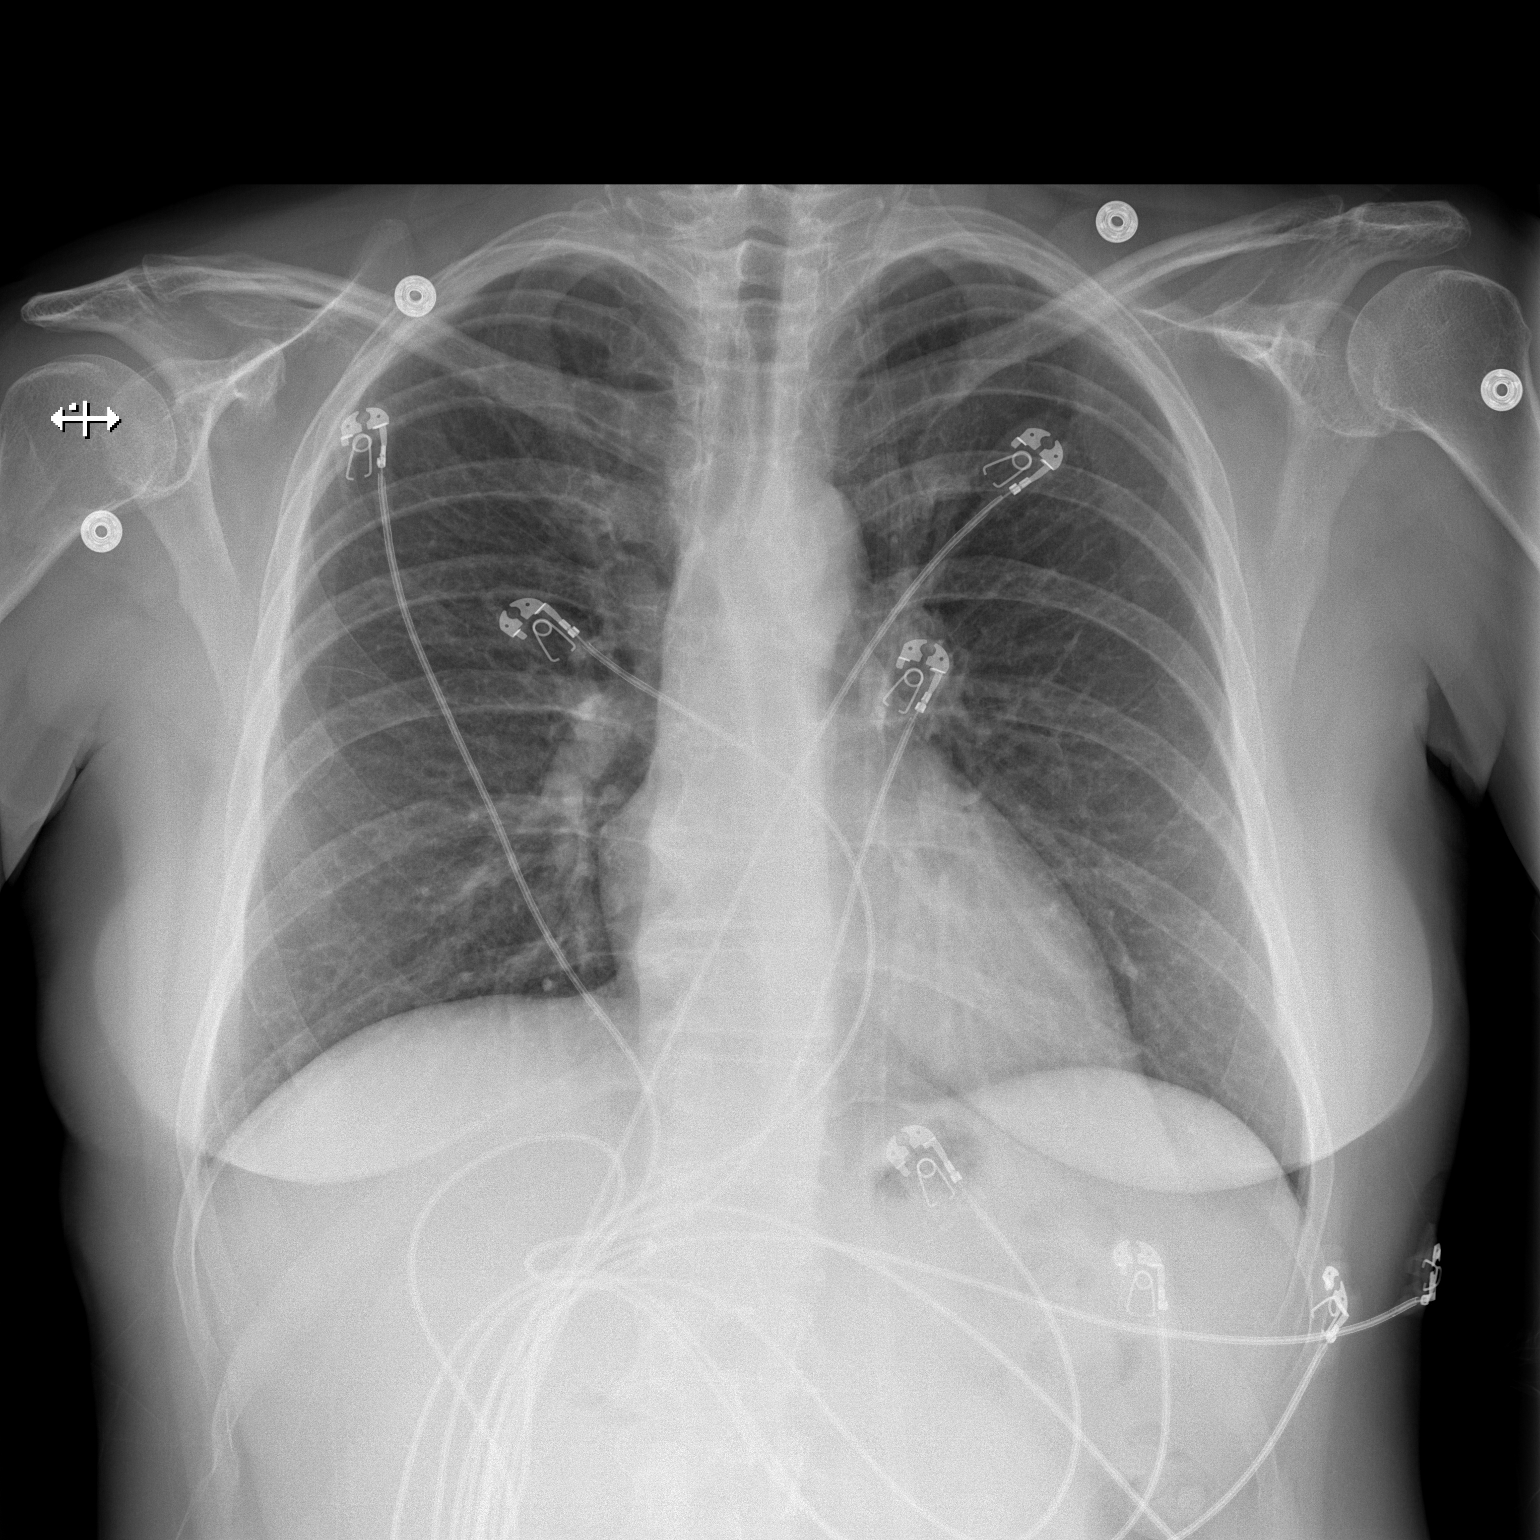

[w chest lat]
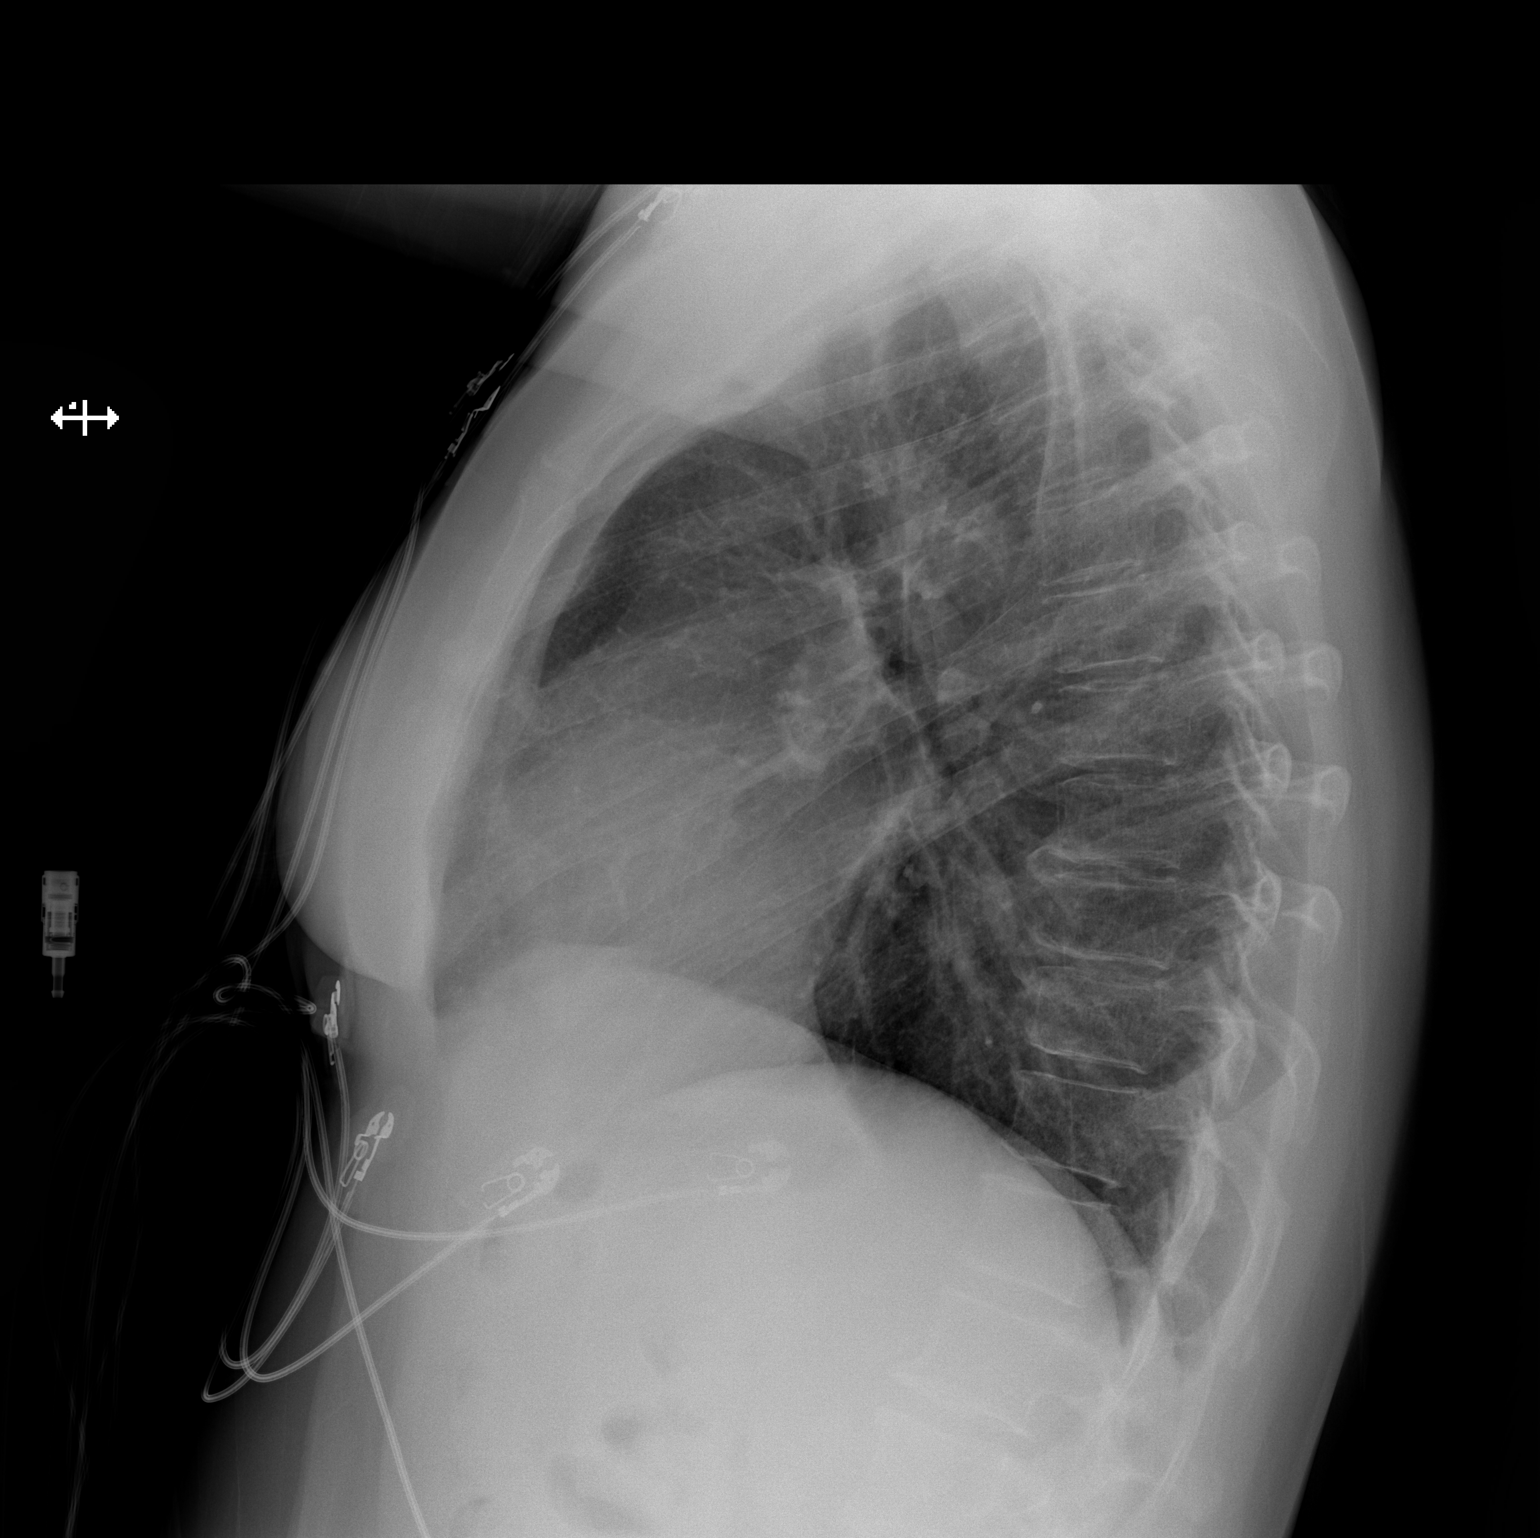

[2 of 2 positions shown; findings below may reference images not displayed]

FINDINGS: EKG leads create artifact over the chest.

Normal heart size and mediastinal contours. No acute infiltrate or
edema. No effusion or pneumothorax. No acute osseous findings.
IMPRESSION: No evidence of active disease.

## 2018-10-28 ENCOUNTER — Encounter: Payer: Self-pay | Admitting: Gastroenterology

## 2018-11-25 LAB — HM MAMMOGRAPHY

## 2019-01-05 ENCOUNTER — Encounter: Payer: Self-pay | Admitting: Gastroenterology

## 2019-01-10 ENCOUNTER — Ambulatory Visit: Payer: BC Managed Care – PPO | Admitting: *Deleted

## 2019-01-10 ENCOUNTER — Other Ambulatory Visit: Payer: Self-pay

## 2019-01-10 VITALS — Ht 66.0 in | Wt 160.0 lb

## 2019-01-10 DIAGNOSIS — Z86010 Personal history of colonic polyps: Secondary | ICD-10-CM

## 2019-01-10 MED ORDER — SUPREP BOWEL PREP KIT 17.5-3.13-1.6 GM/177ML PO SOLN: 1.0000 | Freq: Once | ORAL | 0 refills | Status: AC

## 2019-01-10 NOTE — Progress Notes (Signed)
Patient denies any allergies to egg or soy products. Patient denies complications with anesthesia/sedation.  Patient denies oxygen use at home and denies diet medications. Emmi instructions for colonoscopy/endoscopy explained and given to patient.  Pt verified name, DOB, address and insurance during PV today. Pt mailed instruction packet to included paper to complete and mail back to [facility] with addressed and stamped envelope, Emmi video, copy of consent form to read and not return, and instructions. Suprep coupon mailed in packet. PV completed over the phone. Pt encouraged to call with questions or concerns.

## 2019-01-17 ENCOUNTER — Telehealth: Payer: Self-pay | Admitting: Gastroenterology

## 2019-01-17 NOTE — Telephone Encounter (Signed)

## 2019-01-18 ENCOUNTER — Encounter: Payer: Self-pay | Admitting: Gastroenterology

## 2019-01-18 ENCOUNTER — Ambulatory Visit (AMBULATORY_SURGERY_CENTER): Payer: BC Managed Care – PPO | Admitting: Gastroenterology

## 2019-01-18 ENCOUNTER — Other Ambulatory Visit: Payer: Self-pay

## 2019-01-18 VITALS — BP 101/64 | HR 55 | Temp 98.5°F | Resp 11 | Ht 66.0 in | Wt 160.0 lb

## 2019-01-18 DIAGNOSIS — Z86010 Personal history of colonic polyps: Secondary | ICD-10-CM

## 2019-01-18 MED ORDER — SODIUM CHLORIDE 0.9 % IV SOLN: 500.0000 mL | Freq: Once | INTRAVENOUS | Status: DC

## 2019-01-18 NOTE — Progress Notes (Signed)
PT taken to PACU. Monitors in place. VSS. Report given to RN. 

## 2019-01-18 NOTE — Patient Instructions (Signed)
YOU HAD AN ENDOSCOPIC PROCEDURE TODAY AT THE [facility]:   Refer to the procedure report that was given to you for any specific questions about what was found during the examination.  If the procedure report does not answer your questions, please call your gastroenterologist to clarify.  If you requested that your care partner not be given the details of your procedure findings, then the procedure report has been included in a sealed envelope for you to review at your convenience later.  YOU SHOULD EXPECT: Some feelings of bloating in the abdomen. Passage of more gas than usual.  Walking can help get rid of the air that was put into your GI tract during the procedure and reduce the bloating. If you had a lower endoscopy (such as a colonoscopy or flexible sigmoidoscopy) you may notice spotting of blood in your stool or on the toilet paper. If you underwent a bowel prep for your procedure, you may not have a normal bowel movement for a few days.  Please Note:  You might notice some irritation and congestion in your nose or some drainage.  This is from the oxygen used during your procedure.  There is no need for concern and it should clear up in a day or so.  SYMPTOMS TO REPORT IMMEDIATELY:   Following lower endoscopy (colonoscopy or flexible sigmoidoscopy):  Excessive amounts of blood in the stool  Significant tenderness or worsening of abdominal pains  Swelling of the abdomen that is new, acute  Fever of 100F or higher  For urgent or emergent issues, a gastroenterologist can be reached at any hour by calling [phone].   DIET:  We do recommend a small meal at first, but then you may proceed to your regular diet.  Drink plenty of fluids but you should avoid alcoholic beverages for 24 hours.  ACTIVITY:  You should plan to take it easy for the rest of today and you should NOT DRIVE or use heavy machinery until tomorrow (because of the sedation medicines used during the test).     FOLLOW UP: Our staff will call the number listed on your records 48-72 hours following your procedure to check on you and address any questions or concerns that you may have regarding the information given to you following your procedure. If we do not reach you, we will leave a message.  We will attempt to reach you two times.  During this call, we will ask if you have developed any symptoms of COVID 19. If you develop any symptoms (ie: fever, flu-like symptoms, shortness of breath, cough etc.) before then, please call [phone].  If you test positive for Covid 19 in the 2 weeks post procedure, please call and report this information to us.    If any biopsies were taken you will be contacted by phone or by letter within the next 1-3 weeks.  Please call us at [phone] if you have not heard about the biopsies in 3 weeks.    SIGNATURES/CONFIDENTIALITY: You and/or your care partner have signed paperwork which will be entered into your electronic medical record.  These signatures attest to the fact that that the information above on your After Visit Summary has been reviewed and is understood.  Full responsibility of the confidentiality of this discharge information lies with you and/or your care-partner. 

## 2019-01-18 NOTE — Progress Notes (Signed)
Temp- Brenda Foster  Vital signs-courtney washington  Pt's states no medical or surgical changes since previsit or office visit.

## 2019-01-18 NOTE — Op Note (Signed)
[facility] Patient Name: Chantell Kunkler Procedure Date: 01/18/2019 8:43 AM MRN: [id number] Endoscopist: Mauri Pole , MD Age: 53 Referring MD:  Date of Birth: June 09, 1966 Gender: Female Account #: [id number] Procedure:                Colonoscopy Indications:              High risk colon cancer surveillance: Personal                            history of colonic polyps, Surveillance: Piecemeal                            removal of large sessile adenoma last colonoscopy                            (< 3 yrs) Medicines:                Monitored Anesthesia Care Procedure:                Pre-Anesthesia Assessment:                           - Prior to the procedure, a History and Physical                            was performed, and patient medications and                            allergies were reviewed. The patient's tolerance of                            previous anesthesia was also reviewed. The risks                            and benefits of the procedure and the sedation                            options and risks were discussed with the patient.                            All questions were answered, and informed consent                            was obtained. Prior Anticoagulants: The patient has                            taken no previous anticoagulant or antiplatelet                            agents. ASA Grade Assessment: II - A patient with                            mild systemic disease. After reviewing the risks  and benefits, the patient was deemed in                            satisfactory condition to undergo the procedure.                           After obtaining informed consent, the colonoscope                            was passed under direct vision. Throughout the                            procedure, the patient's blood pressure, pulse, and                            oxygen saturations were monitored continuously.  The                            Colonoscope was introduced through the anus and                            advanced to the the cecum, identified by                            appendiceal orifice and ileocecal valve. The                            colonoscopy was performed without difficulty. The                            patient tolerated the procedure well. The quality                            of the bowel preparation was excellent. The                            ileocecal valve, appendiceal orifice, and rectum                            were photographed. Scope In: 8:51:23 AM Scope Out: 9:37:16 AM Scope Withdrawal Time: 0 hours 8 minutes 36 seconds  Total Procedure Duration: 0 hours 14 minutes 54 seconds  Findings:                 The perianal and digital rectal examinations were                            normal.                           Scattered small and large-mouthed diverticula were                            found in the sigmoid colon and descending colon.  Non-bleeding internal hemorrhoids were found during                            retroflexion. The hemorrhoids were small.                           The exam was otherwise without abnormality. Complications:            No immediate complications. Estimated Blood Loss:     Estimated blood loss: none. Impression:               - Mild diverticulosis in the sigmoid colon and in                            the descending colon.                           - Non-bleeding internal hemorrhoids.                           - The examination was otherwise normal.                           - No specimens collected. Recommendation:           - Patient has a contact number available for                            emergencies. The signs and symptoms of potential                            delayed complications were discussed with the                            patient. Return to normal activities tomorrow.                             Written discharge instructions were provided to the                            patient.                           - Resume previous diet.                           - Continue present medications.                           - Repeat colonoscopy in 5 years for surveillance.                           - Return to GI clinic PRN. Mauri Pole, MD 01/18/2019 9:16:37 AM This report has been signed electronically.

## 2019-01-20 ENCOUNTER — Telehealth: Payer: Self-pay

## 2019-01-20 NOTE — Telephone Encounter (Signed)
  Follow up Call-  Call back number 01/18/2019 09/30/2017 07/16/2016  Post procedure Call Back phone  # [phone] cell [phone]  Permission to leave phone message Yes Yes Yes  Some recent data might be hidden     Patient questions:  Do you have a fever, pain , or abdominal swelling? No. Pain Score  0 *  Have you tolerated food without any problems? Yes.    Have you been able to return to your normal activities? Yes.    Do you have any questions about your discharge instructions: Diet   No. Medications  No. Follow up visit  No.  Do you have questions or concerns about your Care? No.  Actions: * If pain score is 4 or above: No action needed, pain <4.  1. Have you developed a fever since your procedure? no  2.   Have you had an respiratory symptoms (SOB or cough) since your procedure? no  3.   Have you tested positive for COVID 19 since your procedure no  4.   Have you had any family members/close contacts diagnosed with the COVID 19 since your procedure?  no   If yes to any of these questions please route to Joylene John, RN and Alphonsa Gin, Therapist, sports.

## 2019-01-20 NOTE — Telephone Encounter (Signed)
Attempted to reach pt. With follow-up call following endoscopic procedure 01/18/2019.  LM on pt. Voice mail.   Will try to reach pt. Again later today.

## 2019-02-17 ENCOUNTER — Other Ambulatory Visit: Payer: Self-pay | Admitting: Family Medicine

## 2019-02-17 DIAGNOSIS — M2550 Pain in unspecified joint: Secondary | ICD-10-CM

## 2019-02-17 NOTE — Telephone Encounter (Signed)
Per last OV note pt is to follow up every 6 mos on her chronic conditions. She is due. Please get her scheduled- can be a virtual.

## 2019-02-17 NOTE — Telephone Encounter (Signed)
RF request for Voltaren 75mg  EC tab LOV: 08/01/2018 Next ov: not scheduled Last written: 08/01/2018 #180 x 1 refill  I wasn't sure if I could refill this medication.   Please advise, thank you

## 2019-02-20 NOTE — Telephone Encounter (Signed)
Called patient and lvm to call back and schedule a CMC so we can get her medications filled. She overdue for a 8mo follow up.

## 2019-02-23 ENCOUNTER — Telehealth: Payer: Self-pay | Admitting: Family Medicine

## 2019-02-23 NOTE — Telephone Encounter (Signed)
Pt was called and told we could move appt up and do a VV for med refills. PT was agreeable as the CPE does not work with her schedule and she needed to reschedule just for a Hugo. This was scheduled and CPE was cancelled

## 2019-02-23 NOTE — Telephone Encounter (Signed)
Patient made an appointment however could not make it before her diclofenac (VOLTAREN) runs out. Can a Rx be sent in to tie her over? Thanks

## 2019-03-01 ENCOUNTER — Encounter: Payer: Self-pay | Admitting: Family Medicine

## 2019-03-01 ENCOUNTER — Ambulatory Visit (INDEPENDENT_AMBULATORY_CARE_PROVIDER_SITE_OTHER): Payer: BC Managed Care – PPO | Admitting: Family Medicine

## 2019-03-01 ENCOUNTER — Other Ambulatory Visit: Payer: Self-pay

## 2019-03-01 VITALS — Temp 97.4°F | Ht 67.72 in | Wt 163.0 lb

## 2019-03-01 DIAGNOSIS — M2550 Pain in unspecified joint: Secondary | ICD-10-CM | POA: Diagnosis not present

## 2019-03-01 DIAGNOSIS — F418 Other specified anxiety disorders: Secondary | ICD-10-CM

## 2019-03-01 DIAGNOSIS — M545 Low back pain, unspecified: Secondary | ICD-10-CM

## 2019-03-01 DIAGNOSIS — R251 Tremor, unspecified: Secondary | ICD-10-CM

## 2019-03-01 MED ORDER — PROPRANOLOL HCL 10 MG PO TABS: 10.0000 mg | ORAL_TABLET | Freq: Two times a day (BID) | ORAL | 1 refills | Status: DC

## 2019-03-01 MED ORDER — FLUOXETINE HCL 20 MG PO TABS: 30.0000 mg | ORAL_TABLET | Freq: Every day | ORAL | 1 refills | Status: DC

## 2019-03-01 MED ORDER — DICLOFENAC SODIUM 75 MG PO TBEC: 75.0000 mg | DELAYED_RELEASE_TABLET | Freq: Two times a day (BID) | ORAL | 1 refills | Status: DC

## 2019-03-01 NOTE — Progress Notes (Signed)
VIRTUAL VISIT VIA VIDEO  I connected with Brenda Foster on 03/01/2019 at  4:00 PM EDT by a video enabled telemedicine application and verified that I am speaking with the correct person using two identifiers. Location patient: Home Location provider: [facility], Office Persons participating in the virtual visit: Patient, Dr. Raoul Pitch and R.Baker, LPN  I discussed the limitations of evaluation and management by telemedicine and the availability of in person appointments. The patient expressed understanding and agreed to proceed.     Brenda Foster , [birth year], 53 y.o., female MRN: [id number] Patient Care Team    Relationship Specialty Notifications Start End  Ma Hillock, DO PCP - General Family Medicine  05/12/16   Dineen Kid, DO Referring Physician   05/12/16    Comment: psych  Servando Salina, MD Consulting Physician Obstetrics and Gynecology  05/12/16     Chief Complaint  Patient presents with  . Depression    Pt needs refills on all medications, including medicine for her back   . Anxiety  . Back Pain     Subjective:  Brenda Foster is a 53 y.o. female present for  Follow up on   Anxiety/tremor:.  Patient reports she is doing rather well on Prozac 30 mg daily and the propranolol 10 mg twice daily.  She does need refills on these medications today.   Original note:  Pt presents for an OV with complaints of anxiety and tremor from prozac of 1.5 year duration.  Condition is well controlled on prozac 30 mg and Propanolol 10 mg BID. Her prior psychiatric provider has moved and she would like to come here for her care surrounding this issue.   Polyarthralgia/lumbar pain:  Patient reports she is doing well with the diclofenac 75 mg twice daily.  It is helping control her pain and allowing her to function daily.  She was referred to orthopedics to discuss the possibility of treatment with injections of her lumbar spine.  She states she had an MRI  completed and they were going to start injections through Dr. Nelva Bush, however her pain has improved a great deal so she would like to wait before pursuing. Prior note:  Patient reports her lumbar back, hips and knees are much improved on the Voltaren twice daily.  However she still complains of increased lower back pain after sitting for long periods of time.  She reports pain in the middle the night in her knees and hips.  She reports the pain is improved when she is moving around.  She had a weak positive ANA and was referred to rheumatology, in which she did not have a good experience and they did not feel her symptoms were related to a autoimmune disorder.  MRI 2018 report below.  Lumbar x-ray June 2019 with degenerative disc and facet disease.  No acute bony abnormality.  Degenerative disc most notable at L4-5 with disc space narrowing and spurring. He is frustrated with her continued daily pain.  She would like to seek orthopedics referral for further evaluation.  Prior note 12/08/2017: Pt is a 53 y.o. female presents for an OV with multiple complaints today.  She reports she just does not feel her normal self any longer.  She has been slowly progressing and her symptoms since last fall.  She has multiple arthralgia complaints of bilateral hips, bilateral knees and lower back.  She reports she has been seen by spine specialist since January.  She has been performing physical therapy since  then.  She feels her back discomfort has worsened with standing.  She reports she has a stabbing pain when she stands.  She has radiating pain to her bilateral hips.  She has started using a standing desk at work to help prevent the symptoms.  She reports sometimes it feels better when walking.  She walks a couple miles a day to help with relief.  Reports she is doing very well on physical therapy and they comment on her range of motion being so great.  He has been tried on Mobic and she did not feel that was very  helpful.  She also feels like she is in a "brain fog".  Has decreased energy.  She is worried she has Lyme disease.  She became rather ill in July of last year was hospitalized.  She had an infectious disease work-up at that time which resulted with negative Lyme titers, negative hepatitis hepatitis, negative [place] spotted fever titers, negative E. Chaffeensis titers, CMV IgM was negative at that time.  Negative for heavy metals lead/mercury also in July 2018.   MRI August 22, 2016 is altered with L1-L2 mild disc narrowing and desiccation with central endplate spurring.  L3-L4 mild far lateral disc bulging and facet spurring.  L4-L5 mild disc narrowing and desiccation.  Disc bulging eccentric to the right far lateral region.  Annular fissure noticed posteriorly.  Mild facet spurring.  L5-S1 mild disc narrowing and bulging with tiny central protrusion.  Facet arthropathy with mild spurring.  She does not have a family history of arthritis. Depression screen [facility] 2/9 03/01/2019 08/01/2018 08/27/2017 05/12/2016  Decreased Interest 0 0 0 0  Down, Depressed, Hopeless 0 0 0 0  PHQ - 2 Score 0 0 0 0  Altered sleeping 0 0 1 -  Tired, decreased energy 0 0 0 -  Change in appetite 0 1 0 -  Feeling bad or failure about yourself  0 0 0 -  Trouble concentrating 0 0 1 -  Moving slowly or fidgety/restless 0 0 0 -  Suicidal thoughts 0 0 0 -  PHQ-9 Score 0 1 2 -  Difficult doing work/chores Not difficult at all Not difficult at all - -    Allergies  Allergen Reactions  . Heparin     Do not give per MD  . Sulfa Antibiotics Hives   Social History   Tobacco Use  . Smoking status: Never Smoker  . Smokeless tobacco: Never Used  Substance Use Topics  . Alcohol use: Yes    Alcohol/week: 7.0 - 10.0 standard drinks    Types: 7 - 10 Glasses of wine per week   Past Medical History:  Diagnosis Date  . Anxiety   . Depression    Mild n/ infertility  . History of miscarriage   . Infertility   . Interstitial  cystitis   . Low back pain   . Migraines   . Sepsis ([place]) 12/2016   in hospital 1 week  . Urinary tract bacterial infections    2x  . Urine incontinence    Mild   Past Surgical History:  Procedure Laterality Date  . APPENDECTOMY  1980  . BREAST SURGERY  1986   Reduction  . CERVICAL CONE BIOPSY  1990  . CESAREAN SECTION  2003   1 time  . COLONOSCOPY  09/30/2017   polyps  . DILATION AND CURETTAGE OF UTERUS  1999-2002  . HYSTEROSCOPY  2001  . LAPAROSCOPY  2001  . LASIK    .  TUBAL LIGATION  2003  . WISDOM TOOTH EXTRACTION     Family History  Problem Relation Age of Onset  . Cancer Father        prostate  . Heart disease Father 51       MI, premature  . Stroke Father        Mind  . Prostate cancer Father   . Stroke Maternal Grandfather   . Anemia Mother   . Autism spectrum disorder Daughter   . Stroke Maternal Grandmother   . Colon cancer Neg Hx   . Esophageal cancer Neg Hx   . Liver cancer Neg Hx   . Pancreatic cancer Neg Hx   . Rectal cancer Neg Hx   . Stomach cancer Neg Hx    Allergies as of 03/01/2019      Reactions   Heparin    Do not give per MD   Sulfa Antibiotics Hives      Medication List       Accurate as of March 01, 2019  4:09 PM. If you have any questions, ask your nurse or doctor.        CALCIUM-VITAMIN D PO Take by mouth. Take 2 pills daily   diclofenac 75 MG EC tablet Commonly known as: VOLTAREN Take 1 tablet (75 mg total) by mouth 2 (two) times daily.   FLUoxetine 20 MG tablet Commonly known as: PROZAC Take 1.5 tablets (30 mg total) by mouth daily. Needs office visit prior to any additional refills   Magnesium 200 MG Tabs Take by mouth at bedtime.   multivitamin tablet Take 1 tablet by mouth daily.   propranolol 10 MG tablet Commonly known as: INDERAL Take 1 tablet (10 mg total) by mouth 2 (two) times daily. Needs appointment for additional refills.       All past medical history, surgical history, allergies, family  history, immunizations andmedications were updated in the EMR today and reviewed under the history and medication portions of their EMR.     ROS: Negative, with the exception of above mentioned in HPI   Objective:  Temp (!) 97.4 F (36.3 C) (Temporal)   Ht 5' 7.72" (1.72 m)   Wt 163 lb (73.9 kg)   LMP 01/20/2016 (Approximate)   BMI 24.99 kg/m  Body mass index is 24.99 kg/m. Gen: Afebrile. No acute distress.  HENT: AT. [place].  MMM.  Eyes:Pupils Equal Round Reactive to light, Extraocular movements intact,  Conjunctiva without redness, discharge or icterus. Chest: CTAB, no wheeze or crackles Abd: No cough or shortness of breath Neuro:  Normal gait.Alert. Oriented x3  Psych: Normal affect, dress and demeanor. Normal speech. Normal thought content and judgment  No exam data present No results found. No results found for this or any previous visit (from the past 24 hour(s)).  Assessment/Plan: Brenda Foster is a 53 y.o. female present for OV for  Situational anxiety Tremor -Stable.  Continue Prozac 30 mg QD and Propanol 10 mg BID (tremor 2/2 to prozac).  Refills provided to her today If she decides she would like therapy, she can call in anytime and I will be happy to place referral for her to Dr. Mamie Levers.  -Follow-up 6 months  Polyarthralgia/lumbar pain:   -Stable.  Refills provided today on Voltaren 75 mg twice daily. - MRI changes in 2018 suggestive of at least mild spurring of the lumbar spine and x-ray 2019 with degenerative changes.  Patient was referred to orthopedics and established with Dr. Nelva Bush.   Follow-up every  6 months.  BMP with next visit if not collected prior.   Reviewed expectations re: course of current medical issues.  Discussed self-management of symptoms.  Outlined signs and symptoms indicating need for more acute intervention.  Patient verbalized understanding and all questions were answered.  Patient received an After-Visit Summary.    No  orders of the defined types were placed in this encounter.  > 15 minutes spent with patient, > 50% of that time face to face   Note is dictated utilizing voice recognition software. Although note has been proof read prior to signing, occasional typographical errors still can be missed. If any questions arise, please do not hesitate to call for verification.   electronically signed by:  Howard Pouch, DO  [place]

## 2019-03-17 ENCOUNTER — Encounter: Payer: BC Managed Care – PPO | Admitting: Family Medicine

## 2019-09-12 ENCOUNTER — Other Ambulatory Visit: Payer: Self-pay | Admitting: Family Medicine

## 2019-09-12 DIAGNOSIS — M2550 Pain in unspecified joint: Secondary | ICD-10-CM

## 2019-10-02 ENCOUNTER — Encounter: Payer: Self-pay | Admitting: Family Medicine

## 2019-10-02 DIAGNOSIS — M2550 Pain in unspecified joint: Secondary | ICD-10-CM

## 2019-10-02 MED ORDER — DICLOFENAC SODIUM 75 MG PO TBEC: 75.0000 mg | DELAYED_RELEASE_TABLET | Freq: Two times a day (BID) | ORAL | 0 refills | Status: DC

## 2019-10-02 NOTE — Addendum Note (Signed)
Addended by: Howard Pouch A on: 10/02/2019 12:35 PM   Modules accepted: Orders

## 2019-10-02 NOTE — Telephone Encounter (Signed)
Refill Voltaren for her.  Please, make her aware.

## 2019-10-02 NOTE — Telephone Encounter (Signed)
RF request for Voltaren  LOV: 03/01/2019 Next ov: 10/06/2019 CPE  Last written: 03/01/2019

## 2019-10-06 ENCOUNTER — Ambulatory Visit (INDEPENDENT_AMBULATORY_CARE_PROVIDER_SITE_OTHER): Payer: BC Managed Care – PPO | Admitting: Family Medicine

## 2019-10-06 ENCOUNTER — Encounter: Payer: Self-pay | Admitting: Family Medicine

## 2019-10-06 ENCOUNTER — Other Ambulatory Visit: Payer: Self-pay

## 2019-10-06 VITALS — BP 126/79 | HR 54 | Temp 97.6°F | Resp 16 | Ht 68.0 in | Wt 169.4 lb

## 2019-10-06 DIAGNOSIS — Z0000 Encounter for general adult medical examination without abnormal findings: Secondary | ICD-10-CM | POA: Diagnosis not present

## 2019-10-06 DIAGNOSIS — M2550 Pain in unspecified joint: Secondary | ICD-10-CM

## 2019-10-06 DIAGNOSIS — F418 Other specified anxiety disorders: Secondary | ICD-10-CM

## 2019-10-06 DIAGNOSIS — Z130 Encounter for screening for diseases of the blood and blood-forming organs and certain disorders involving the immune mechanism: Secondary | ICD-10-CM

## 2019-10-06 DIAGNOSIS — M545 Low back pain, unspecified: Secondary | ICD-10-CM

## 2019-10-06 DIAGNOSIS — R6889 Other general symptoms and signs: Secondary | ICD-10-CM | POA: Diagnosis not present

## 2019-10-06 DIAGNOSIS — M25552 Pain in left hip: Secondary | ICD-10-CM

## 2019-10-06 DIAGNOSIS — Z79899 Other long term (current) drug therapy: Secondary | ICD-10-CM | POA: Diagnosis not present

## 2019-10-06 DIAGNOSIS — F488 Other specified nonpsychotic mental disorders: Secondary | ICD-10-CM

## 2019-10-06 DIAGNOSIS — R5383 Other fatigue: Secondary | ICD-10-CM

## 2019-10-06 DIAGNOSIS — Z13220 Encounter for screening for lipoid disorders: Secondary | ICD-10-CM

## 2019-10-06 DIAGNOSIS — Z131 Encounter for screening for diabetes mellitus: Secondary | ICD-10-CM

## 2019-10-06 DIAGNOSIS — Z23 Encounter for immunization: Secondary | ICD-10-CM

## 2019-10-06 DIAGNOSIS — R4189 Other symptoms and signs involving cognitive functions and awareness: Secondary | ICD-10-CM | POA: Insufficient documentation

## 2019-10-06 DIAGNOSIS — M25551 Pain in right hip: Secondary | ICD-10-CM

## 2019-10-06 LAB — COMPREHENSIVE METABOLIC PANEL
ALT: 17 U/L (ref 0–35)
AST: 17 U/L (ref 0–37)
Albumin: 4 g/dL (ref 3.5–5.2)
Alkaline Phosphatase: 119 U/L — ABNORMAL HIGH (ref 39–117)
BUN: 13 mg/dL (ref 6–23)
CO2: 27 mEq/L (ref 19–32)
Calcium: 8.9 mg/dL (ref 8.4–10.5)
Chloride: 106 mEq/L (ref 96–112)
Creatinine, Ser: 0.79 mg/dL (ref 0.40–1.20)
GFR: 75.63 mL/min (ref >60.00)
Glucose, Bld: 98 mg/dL (ref 70–99)
Potassium: 4.1 mEq/L (ref 3.5–5.1)
Sodium: 139 mEq/L (ref 135–145)
Total Bilirubin: 0.4 mg/dL (ref 0.2–1.2)
Total Protein: 6.5 g/dL (ref 6.0–8.3)

## 2019-10-06 LAB — CBC
HCT: 37.1 % (ref 36.0–46.0)
Hemoglobin: 12.6 g/dL (ref 12.0–15.0)
MCHC: 34 g/dL (ref 30.0–36.0)
MCV: 100.6 fl — ABNORMAL HIGH (ref 78.0–100.0)
Platelets: 204 10*3/uL (ref 150.0–400.0)
RBC: 3.69 Mil/uL — ABNORMAL LOW (ref 3.87–5.11)
RDW: 12.8 % (ref 11.5–15.5)
WBC: 4.5 10*3/uL (ref 4.0–10.5)

## 2019-10-06 LAB — TSH: TSH: 0.72 u[IU]/mL (ref 0.35–4.50)

## 2019-10-06 LAB — VITAMIN D 25 HYDROXY (VIT D DEFICIENCY, FRACTURES): VITD: 24.66 ng/mL — ABNORMAL LOW (ref 30.00–100.00)

## 2019-10-06 LAB — T3, FREE: T3, Free: 3 pg/mL (ref 2.3–4.2)

## 2019-10-06 LAB — LIPID PANEL
Cholesterol: 211 mg/dL — ABNORMAL HIGH (ref 0–200)
HDL: 36.6 mg/dL — ABNORMAL LOW
LDL Cholesterol: 150 mg/dL — ABNORMAL HIGH (ref 0–99)
NonHDL: 174.3
Total CHOL/HDL Ratio: 6
Triglycerides: 120 mg/dL (ref 0.0–149.0)
VLDL: 24 mg/dL (ref 0.0–40.0)

## 2019-10-06 LAB — HEMOGLOBIN A1C: Hgb A1c MFr Bld: 5.1 % (ref 4.6–6.5)

## 2019-10-06 LAB — T4, FREE: Free T4: 0.84 ng/dL (ref 0.60–1.60)

## 2019-10-06 MED ORDER — PROPRANOLOL HCL 10 MG PO TABS: 10.0000 mg | ORAL_TABLET | Freq: Two times a day (BID) | ORAL | 1 refills | Status: DC

## 2019-10-06 MED ORDER — DICLOFENAC SODIUM 75 MG PO TBEC: 75.0000 mg | DELAYED_RELEASE_TABLET | Freq: Two times a day (BID) | ORAL | 0 refills | Status: DC

## 2019-10-06 MED ORDER — FLUOXETINE HCL 20 MG PO TABS: 30.0000 mg | ORAL_TABLET | Freq: Every day | ORAL | 1 refills | Status: DC

## 2019-10-06 NOTE — Progress Notes (Signed)
This visit occurred during the SARS-CoV-2 public health emergency.  Safety protocols were in place, including screening questions prior to the visit, additional usage of staff PPE, and extensive cleaning of exam room while observing appropriate contact time as indicated for disinfecting solutions.    Patient ID: Brenda Foster, female  DOB: 15-May-1966, 54 y.o.   MRN: [id number] Patient Care Team    Relationship Specialty Notifications Start End  Ma Hillock, DO PCP - General Family Medicine  05/12/16   Dineen Kid, DO Referring Physician   05/12/16    Comment: psych  Servando Salina, MD Consulting Physician Obstetrics and Gynecology  05/12/16     Chief Complaint  Patient presents with  . Annual Exam    Fasting. 2020 Mammogram, Pap smear 2019- [facility]. Will request records.    Subjective: Brenda Foster is a 54 y.o.  Female  present for CPE. All past medical history, surgical history, allergies, family history, immunizations, medications and social history were updated in the electronic medical record today. All recent labs, ED visits and hospitalizations within the last year were reviewed.  Health maintenance:  Colonoscopy: completed 12/2018 Dr. Silverio Decamp- rpt 5 yr.  Mammogram: completed:2020 [facility] Cervical cancer screening: last pap:2019, results:normal per pt, completed by: Dr. Garwin Brothers.  Immunizations: tdap UTD 2015, Influenza UTD 2020 (encouraged yearly), shingrix #1 provided today, covid pfizer completed Infectious disease screening: HIV completed 2015 DEXA:N/A consider at 54 for estrogen deficiency (perimenopausal) and vit d deficiency. Takes multivitamin with Vit D only.  Assistive device: none Oxygen [id number] Patient has a Dental home. Hospitalizations/ED visits: reviewed  Fatigue/brain fog/constipation/shortness of breath: Patient reports she also continues to have fatigue and does not feel she is ever been back to baseline since she was  hospitalized a few years ago with sepsis.  She feels like she has brain fog and is lack of the ability to focus as she had before.  She struggles with constipation on occasions.  She is not taking stool softeners at this time.  She walks daily through her neighborhood and has found that she feels more winded during her walk and even with other activities such as washing dishes/standing for long periods of time.  She denies chest pain, dizziness, lower extremity edema or syncope.  Anxiety/tremor:.  Patient reports she is doing very well on Prozac 30 mg daily and the propranolol 10 mg twice daily.   Original note:  Pt presents for an OV with complaints of anxiety and tremor from prozac of 1.5 year duration.  Condition is well controlled on prozac 30 mg and Propanolol 10 mg BID. Her prior psychiatric provider has moved and she would like to come here for her care surrounding this issue.   Polyarthralgia/lumbar pain:  Patient reports diclofenac 75 mg twice daily has been very helpful and allows her to have her quality of life.   It is helping control her pain and allowing her to function daily.  She was referred to orthopedics to discuss the possibility of treatment with injections of her lumbar spine.  She states she had an MRI completed and they were going to start injections through Dr. Nelva Bush, however her pain has improved a great deal so she would like to wait before pursuing. Prior note:  Patient reports her lumbar back, hips and knees are much improved on the Voltaren twice daily.  However she still complains of increased lower back pain after sitting for long periods of time.  She reports pain in the middle  the night in her knees and hips.  She reports the pain is improved when she is moving around.  She had a weak positive ANA and was referred to rheumatology, in which she did not have a good experience and they did not feel her symptoms were related to a autoimmune disorder.  MRI 2018 report below.   Lumbar x-ray June 2019 with degenerative disc and facet disease.  No acute bony abnormality.  Degenerative disc most notable at L4-5 with disc space narrowing and spurring. He is frustrated with her continued daily pain.  She would like to seek orthopedics referral for further evaluation.    Depression screen [facility] 2/9 10/06/2019 03/01/2019 08/01/2018 08/27/2017 05/12/2016  Decreased Interest 0 0 0 0 0  Down, Depressed, Hopeless 0 0 0 0 0  PHQ - 2 Score 0 0 0 0 0  Altered sleeping 0 0 0 1 -  Tired, decreased energy 0 0 0 0 -  Change in appetite 0 0 1 0 -  Feeling bad or failure about yourself  0 0 0 0 -  Trouble concentrating 3 0 0 1 -  Moving slowly or fidgety/restless 0 0 0 0 -  Suicidal thoughts 0 0 0 0 -  PHQ-9 Score 3 0 1 2 -  Difficult doing work/chores Not difficult at all Not difficult at all Not difficult at all - -   GAD 7 : Generalized Anxiety Score 10/06/2019 03/01/2019 08/01/2018 08/27/2017  Nervous, Anxious, on Edge 0 0 1 1  Control/stop worrying 0 0 0 0  Worry too much - different things 0 0 0 0  Trouble relaxing 0 0 1 0  Restless 0 0 0 0  Easily annoyed or irritable 0 1 0 0  Afraid - awful might happen 0 0 0 0  Total GAD 7 Score 0 1 2 1   Anxiety Difficulty Not difficult at all Not difficult at all Not difficult at all Not difficult at all    Immunization History  Administered Date(s) Administered  . Influenza,inj,Quad PF,6+ Mos 02/28/2014, 05/15/2018, 02/28/2019  . PFIZER SARS-COV-2 Vaccination 08/17/2019, 09/08/2019  . Tdap 06/22/1996, 10/07/2013, 04/06/2014    Past Medical History:  Diagnosis Date  . Anxiety   . Depression    Mild n/ infertility  . History of miscarriage   . Infertility   . Interstitial cystitis   . Low back pain   . Migraines   . Sepsis ([place]) 12/2016   in hospital 1 week  . Urinary tract bacterial infections    2x  . Urine incontinence    Mild   Allergies  Allergen Reactions  . Heparin     Do not give per MD  . Sulfa Antibiotics Hives     Past Surgical History:  Procedure Laterality Date  . APPENDECTOMY  1980  . BREAST SURGERY  1986   Reduction  . CERVICAL CONE BIOPSY  1990  . CESAREAN SECTION  2003   1 time  . COLONOSCOPY  09/30/2017   polyps  . DILATION AND CURETTAGE OF UTERUS  1999-2002  . HYSTEROSCOPY  2001  . LAPAROSCOPY  2001  . LASIK    . TUBAL LIGATION  2003  . WISDOM TOOTH EXTRACTION     Family History  Problem Relation Age of Onset  . Cancer Father        prostate  . Heart disease Father 48       MI, premature  . Stroke Father        Mind  .  Prostate cancer Father   . Stroke Maternal Grandfather   . Anemia Mother   . Autism spectrum disorder Daughter   . Stroke Maternal Grandmother   . Colon cancer Neg Hx   . Esophageal cancer Neg Hx   . Liver cancer Neg Hx   . Pancreatic cancer Neg Hx   . Rectal cancer Neg Hx   . Stomach cancer Neg Hx    Social History   Social History Narrative   Lives with husband and children. Works FT as Teaching laboratory technician.    Allergies as of 10/06/2019      Reactions   Heparin    Do not give per MD   Sulfa Antibiotics Hives      Medication List       Accurate as of October 06, 2019  9:11 AM. If you have any questions, ask your nurse or doctor.        CALCIUM-VITAMIN D PO Take by mouth. Take 2 pills daily   diclofenac 75 MG EC tablet Commonly known as: VOLTAREN Take 1 tablet (75 mg total) by mouth 2 (two) times daily.   FLUoxetine 20 MG tablet Commonly known as: PROZAC Take 1.5 tablets (30 mg total) by mouth daily. Needs office visit prior to any additional refills   Magnesium 200 MG Tabs Take by mouth at bedtime.   multivitamin tablet Take 1 tablet by mouth daily.   propranolol 10 MG tablet Commonly known as: INDERAL Take 1 tablet (10 mg total) by mouth 2 (two) times daily. Needs appointment for additional refills.       All past medical history, surgical history, allergies, family history, immunizations andmedications were updated in  the EMR today and reviewed under the history and medication portions of their EMR.     No results found for this or any previous visit (from the past 2160 hour(s)).  ROS: 14 pt review of systems performed and negative (unless mentioned in an HPI)  Objective: BP 126/79 (BP Location: Right Arm, Patient Position: Sitting, Cuff Size: Normal)   Pulse (!) 54   Temp 97.6 F (36.4 C) (Temporal)   Resp 16   Ht 5\' 8"  (1.727 m)   Wt 169 lb 6 oz (76.8 kg)   LMP 01/20/2016 (Approximate)   SpO2 100%   BMI 25.75 kg/m  Gen: Afebrile. No acute distress. Nontoxic in appearance, well-developed, well-nourished, pleasant female HENT: AT. [place]. Bilateral TM visualized and normal in appearance, normal external auditory canal. MMM, no oral lesions, adequate dentition. Bilateral nares within normal limits. Throat without erythema, ulcerations or exudates.  No cough on exam, no hoarseness on exam. Eyes:Pupils Equal Round Reactive to light, Extraocular movements intact,  Conjunctiva without redness, discharge or icterus. Neck/lymp/endocrine: Supple, no lymphadenopathy, no thyromegaly CV: RRR no murmur, no edema, +2/4 P posterior tibialis pulses.  Chest: CTAB, no wheeze, rhonchi or crackles.  Normal respiratory effort.  Good air movement. Abd: Soft.  Flat. NTND. BS present.  No masses palpated. No hepatosplenomegaly. No rebound tenderness or guarding. Skin: No rashes, purpura or petechiae. Warm and well-perfused. Skin intact. Neuro/Msk:  Normal gait. PERLA. EOMi. Alert. Oriented x3.  Cranial nerves II through XII intact. Muscle strength 5/5 upper/lower extremity. DTRs equal bilaterally. Psych: Normal affect, dress and demeanor. Normal speech. Normal thought content and judgment.  No exam data present  Assessment/plan: Alesandra Auclair is a 54 y.o. female present for CPE Encounter for long-term current use of medication - Comprehensive metabolic panel Screening for deficiency anemia - CBC Diabetes mellitus  screening - Hemoglobin A1c Lipid screening - Lipid panel  Brain fog/Fatigue/exercise intolerance -We will collect laboratory evaluation today to rule out vitamin deficiencies, endocrine deficiency and iron deficiency anemia as potential cause.  If work-up is normal - refer to pulmonology for further evaluation of possible lung disorder causing her to be more winded, including sleep apnea as cause for brain fog and chronic fatigue.  Patient is in agreement with this plan. -TSH - T4, free - T3, free - Iron, TIBC and Ferritin Panel - Vitamin D (25 hydroxy) - Ambulatory referral to Pulmonology  Need for shingles vaccine - Varicella-zoster vaccine IM  Situational anxiety/Tremor Stable. -  Continue Prozac 30 mg QD  -Continue propanol 10 mg BID (tremor 2/2 to prozac).  If she decides she would like therapy, she can call in anytime and I will be happy to place referral for her -Follow-up 5.5 months  Polyarthralgia/lumbar pain/bilateral hip pain:   Stable. -Continue Voltaren 75 mg twice daily. - MRI changes in 2018 suggestive of at least mild spurring of the lumbar spine and x-ray 2019 with degenerative changes.  Patient was referred to orthopedics and established with Dr. Nelva Bush.   Follow-up 5.5 months.    Preventive health exam:  Patient was encouraged to exercise greater than 150 minutes a week. Patient was encouraged to choose a diet filled with fresh fruits and vegetables, and lean meats. AVS provided to patient today for education/recommendation on gender specific health and safety maintenance. Colonoscopy:  Up-to-date due 12/2023 Mammogram: Up-to-date Cervical cancer screening: Up-to-date Immunizations: tdap UTD 2015, Influenza UTD 2020 (encouraged yearly), shingrix #1 provided today, covid pfizer completed Infectious disease screening: Completed DEXA: Consider early screening at 34  Return in about 6 months (around 04/06/2020) for [place] (30 min) and 1 yr CPE. 3 months for  Shingrix No. 2 by nurse visit.   Orders Placed This Encounter  Procedures  . CBC  . Comprehensive metabolic panel  . Hemoglobin A1c  . Lipid panel  . TSH  . T4, free  . T3, free  . Iron, TIBC and Ferritin Panel  . Vitamin D (25 hydroxy)   Meds ordered this encounter  Medications  . diclofenac (VOLTAREN) 75 MG EC tablet    Sig: Take 1 tablet (75 mg total) by mouth 2 (two) times daily.    Dispense:  180 tablet    Refill:  0  . FLUoxetine (PROZAC) 20 MG tablet    Sig: Take 1.5 tablets (30 mg total) by mouth daily. Needs office visit prior to any additional refills    Dispense:  135 tablet    Refill:  1  . propranolol (INDERAL) 10 MG tablet    Sig: Take 1 tablet (10 mg total) by mouth 2 (two) times daily. Needs appointment for additional refills.    Dispense:  180 tablet    Refill:  1   Referral Orders  No referral(s) requested today     Electronically signed by: Howard Pouch, [place]

## 2019-10-06 NOTE — Patient Instructions (Addendum)
We will call you with all results and guide you further.  If labs do not indicate cause for your symptoms would refer you to pulmonology for evaluation: would need to consider reactive airway/asthma and possily sleep apnea work up for the winded feelings and daily brain fog.   Make sure to HYDRATE> at least 100 ounces of water/d.   Health Maintenance, Female Adopting a healthy lifestyle and getting preventive care are important in promoting health and wellness. Ask your health care provider about:  The right schedule for you to have regular tests and exams.  Things you can do on your own to prevent diseases and keep yourself healthy. What should I know about diet, weight, and exercise? Eat a healthy diet   Eat a diet that includes plenty of vegetables, fruits, low-fat dairy products, and lean protein.  Do not eat a lot of foods that are high in solid fats, added sugars, or sodium. Maintain a healthy weight Body mass index (BMI) is used to identify weight problems. It estimates body fat based on height and weight. Your health care provider can help determine your BMI and help you achieve or maintain a healthy weight. Get regular exercise Get regular exercise. This is one of the most important things you can do for your health. Most adults should:  Exercise for at least 150 minutes each week. The exercise should increase your heart rate and make you sweat (moderate-intensity exercise).  Do strengthening exercises at least twice a week. This is in addition to the moderate-intensity exercise.  Spend less time sitting. Even light physical activity can be beneficial. Watch cholesterol and blood lipids Have your blood tested for lipids and cholesterol at 55 years of age, then have this test every 5 years. Have your cholesterol levels checked more often if:  Your lipid or cholesterol levels are high.  You are older than 55 years of age.  You are at high risk for heart disease. What  should I know about cancer screening? Depending on your health history and family history, you may need to have cancer screening at various ages. This may include screening for:  Breast cancer.  Cervical cancer.  Colorectal cancer.  Skin cancer.  Lung cancer. What should I know about heart disease, diabetes, and high blood pressure? Blood pressure and heart disease  High blood pressure causes heart disease and increases the risk of stroke. This is more likely to develop in people who have high blood pressure readings, are of African descent, or are overweight.  Have your blood pressure checked: ? Every 3-5 years if you are 29-51 years of age. ? Every year if you are 54 years old or older. Diabetes Have regular diabetes screenings. This checks your fasting blood sugar level. Have the screening done:  Once every three years after age 68 if you are at a normal weight and have a low risk for diabetes.  More often and at a younger age if you are overweight or have a high risk for diabetes. What should I know about preventing infection? Hepatitis B If you have a higher risk for hepatitis B, you should be screened for this virus. Talk with your health care provider to find out if you are at risk for hepatitis B infection. Hepatitis C Testing is recommended for:  Everyone born from [birth year] through 1966.  Anyone with known risk factors for hepatitis C. Sexually transmitted infections (STIs)  Get screened for STIs, including gonorrhea and chlamydia, if: ? You are sexually  active and are younger than 55 years of age. ? You are older than 55 years of age and your health care provider tells you that you are at risk for this type of infection. ? Your sexual activity has changed since you were last screened, and you are at increased risk for chlamydia or gonorrhea. Ask your health care provider if you are at risk.  Ask your health care provider about whether you are at high risk for HIV. Your  health care provider may recommend a prescription medicine to help prevent HIV infection. If you choose to take medicine to prevent HIV, you should first get tested for HIV. You should then be tested every 3 months for as long as you are taking the medicine. Pregnancy  If you are about to stop having your period (premenopausal) and you may become pregnant, seek counseling before you get pregnant.  Take 400 to 800 micrograms (mcg) of folic acid every day if you become pregnant.  Ask for birth control (contraception) if you want to prevent pregnancy. Osteoporosis and menopause Osteoporosis is a disease in which the bones lose minerals and strength with aging. This can result in bone fractures. If you are 21 years old or older, or if you are at risk for osteoporosis and fractures, ask your health care provider if you should:  Be screened for bone loss.  Take a calcium or vitamin D supplement to lower your risk of fractures.  Be given hormone replacement therapy (HRT) to treat symptoms of menopause. Follow these instructions at home: Lifestyle  Do not use any products that contain nicotine or tobacco, such as cigarettes, e-cigarettes, and chewing tobacco. If you need help quitting, ask your health care provider.  Do not use street drugs.  Do not share needles.  Ask your health care provider for help if you need support or information about quitting drugs. Alcohol use  Do not drink alcohol if: ? Your health care provider tells you not to drink. ? You are pregnant, may be pregnant, or are planning to become pregnant.  If you drink alcohol: ? Limit how much you use to 0-1 drink a day. ? Limit intake if you are breastfeeding.  Be aware of how much alcohol is in your drink. In the [place] one drink equals one 12 oz bottle of beer (355 mL), one 5 oz glass of wine (148 mL), or one 1 oz glass of hard liquor (44 mL). General instructions  Schedule regular health, dental, and eye  exams.  Stay current with your vaccines.  Tell your health care provider if: ? You often feel depressed. ? You have ever been abused or do not feel safe at home. Summary  Adopting a healthy lifestyle and getting preventive care are important in promoting health and wellness.  Follow your health care provider's instructions about healthy diet, exercising, and getting tested or screened for diseases.  Follow your health care provider's instructions on monitoring your cholesterol and blood pressure. This information is not intended to replace advice given to you by your health care provider. Make sure you discuss any questions you have with your health care provider. Document Revised: 06/01/2018 Document Reviewed: 06/01/2018 Elsevier Patient Education  2020 [employer].

## 2019-10-07 LAB — IRON,TIBC AND FERRITIN PANEL
%SAT: 23 % (calc) (ref 16–45)
Ferritin: 55 ng/mL (ref 16–232)
Iron: 62 ug/dL (ref 45–160)
TIBC: 270 mcg/dL (calc) (ref 250–450)

## 2019-10-10 ENCOUNTER — Telehealth: Payer: Self-pay | Admitting: Family Medicine

## 2019-10-10 NOTE — Telephone Encounter (Addendum)
Please inform patient the following information: - Liver, kidney and thyroid function are normal. - Diabetes screen is normal. - Iron panel was normal. - Vitamin D is mildly low at 24.6, greater than 30 is normal and ideal bone health achieved between 40-50.  I would encourage her to start [phone] units of vitamin D daily.  Vitamin D is better absorbed when taken with food. - Cholesterol screening is mildly elevated with an LDL of 150. Goal for her should be < 130.  I would recommend a mediterranean diet and regular exercise.  A mediterranean diet is high in fruits, vegetables, whole grains, fish, chicken, nuts, healthy fats (olive oil or canola oil). Low fat dairy. There are many online resources and books on this diet. Limit butter, margarine, red meat and sweets.    -An over-the-counter supplement that is effective to help bring down LDL is red yeast rice.  Since her labs are essentially normal, I did refer her to pul to further evaluate her complaint of feeling more winded.

## 2019-10-10 NOTE — Telephone Encounter (Signed)
Spoke w/ Pt- informed of results and recommendations. Pt verbalized understanding.  

## 2019-10-18 ENCOUNTER — Encounter: Payer: Self-pay | Admitting: Family Medicine

## 2019-11-02 ENCOUNTER — Institutional Professional Consult (permissible substitution): Payer: BC Managed Care – PPO | Admitting: Pulmonary Disease

## 2019-12-04 ENCOUNTER — Other Ambulatory Visit: Payer: Self-pay

## 2019-12-04 ENCOUNTER — Ambulatory Visit (INDEPENDENT_AMBULATORY_CARE_PROVIDER_SITE_OTHER): Payer: BC Managed Care – PPO | Admitting: Family Medicine

## 2019-12-04 VITALS — BP 108/70 | HR 58 | Temp 98.0°F | Resp 12 | Ht 68.0 in | Wt 172.8 lb

## 2019-12-04 DIAGNOSIS — F4321 Adjustment disorder with depressed mood: Secondary | ICD-10-CM | POA: Diagnosis not present

## 2019-12-04 DIAGNOSIS — F418 Other specified anxiety disorders: Secondary | ICD-10-CM

## 2019-12-04 MED ORDER — LORAZEPAM 0.5 MG PO TABS: 0.5000 mg | ORAL_TABLET | Freq: Two times a day (BID) | ORAL | 5 refills | Status: DC | PRN

## 2019-12-04 NOTE — Patient Instructions (Signed)
I have called in ativan to use up to 2x a day as needed.  You have refills on this medication as well. If additional refills are needed, we will need to see you since this is a controlled substance.  FYI: Any refills provided today will automatically expire in 6 mos.   Follow up per your routine follow ups for chronic conditions, sooner if needed .

## 2019-12-04 NOTE — Progress Notes (Signed)
This visit occurred during the SARS-CoV-2 public health emergency.  Safety protocols were in place, including screening questions prior to the visit, additional usage of staff PPE, and extensive cleaning of exam room while observing appropriate contact time as indicated for disinfecting solutions.    Patient ID: Brenda Foster, female  DOB: 07-03-1965, 54 y.o.   MRN: [id number] Patient Care Team    Relationship Specialty Notifications Start End  Ma Hillock, DO PCP - General Family Medicine  05/12/16   Dineen Kid, DO Referring Physician   05/12/16    Comment: psych  Servando Salina, MD Consulting Physician Obstetrics and Gynecology  05/12/16     Chief Complaint  Patient presents with  . Anxiety    Subjective: Brenda Foster is a 54 y.o.  Female  present for grief Anxiety/tremor:.  Patient reports she was doing very well on Prozac 30 mg daily and the propranolol 10 mg twice daily.  However, she is having more stress and grief at home. Her daughter has decided not to go to college, which has created stress in the family. Alvetta's father passed away 2 weeks in [place]. She had been present for her mother during these times but states she really is just starting to feel the grief surrounding the passing. She finds herself tearing up > 20x a day. She would like to have a medicine to help her cope with the grief. She would like to be able to speak to people surrounding her fathers passing and not get tearful.  Original note:  Pt presents for an OV with complaints of anxiety and tremor from prozac of 1.5 year duration.  Condition is well controlled on prozac 30 mg and Propanolol 10 mg BID. Her prior psychiatric provider has moved and she would like to come here for her care surrounding this issue.    Depression screen [facility] 2/9 10/06/2019 03/01/2019 08/01/2018 08/27/2017 05/12/2016  Decreased Interest 0 0 0 0 0  Down, Depressed, Hopeless 0 0 0 0 0  PHQ - 2 Score 0 0 0 0 0  Altered  sleeping 0 0 0 1 -  Tired, decreased energy 0 0 0 0 -  Change in appetite 0 0 1 0 -  Feeling bad or failure about yourself  0 0 0 0 -  Trouble concentrating 3 0 0 1 -  Moving slowly or fidgety/restless 0 0 0 0 -  Suicidal thoughts 0 0 0 0 -  PHQ-9 Score 3 0 1 2 -  Difficult doing work/chores Not difficult at all Not difficult at all Not difficult at all - -   GAD 7 : Generalized Anxiety Score 10/06/2019 03/01/2019 08/01/2018 08/27/2017  Nervous, Anxious, on Edge 0 0 1 1  Control/stop worrying 0 0 0 0  Worry too much - different things 0 0 0 0  Trouble relaxing 0 0 1 0  Restless 0 0 0 0  Easily annoyed or irritable 0 1 0 0  Afraid - awful might happen 0 0 0 0  Total GAD 7 Score 0 1 2 1   Anxiety Difficulty Not difficult at all Not difficult at all Not difficult at all Not difficult at all    Immunization History  Administered Date(s) Administered  . Influenza,inj,Quad PF,6+ Mos 02/28/2014, 05/15/2018, 02/28/2019  . PFIZER SARS-COV-2 Vaccination 08/17/2019, 09/08/2019  . Tdap 06/22/1996, 10/07/2013, 04/06/2014  . Zoster Recombinat (Shingrix) 10/06/2019    Past Medical History:  Diagnosis Date  . Anxiety   . Depression  Mild n/ infertility  . History of miscarriage   . Infertility   . Interstitial cystitis   . Low back pain   . Migraines   . Sepsis ([place]) 12/2016   in hospital 1 week  . Urinary tract bacterial infections    2x  . Urine incontinence    Mild   Allergies  Allergen Reactions  . Heparin     Do not give per MD  . Sulfa Antibiotics Hives   Past Surgical History:  Procedure Laterality Date  . APPENDECTOMY  1980  . BREAST SURGERY  1986   Reduction  . CERVICAL CONE BIOPSY  1990  . CESAREAN SECTION  2003   1 time  . COLONOSCOPY  09/30/2017   polyps  . DILATION AND CURETTAGE OF UTERUS  1999-2002  . HYSTEROSCOPY  2001  . LAPAROSCOPY  2001  . LASIK    . TUBAL LIGATION  2003  . WISDOM TOOTH EXTRACTION     Family History  Problem Relation Age of Onset   . Cancer Father        prostate  . Heart disease Father 44       MI, premature  . Stroke Father        Mind  . Prostate cancer Father   . Stroke Maternal Grandfather   . Anemia Mother   . Autism spectrum disorder Daughter   . Stroke Maternal Grandmother   . Colon cancer Neg Hx   . Esophageal cancer Neg Hx   . Liver cancer Neg Hx   . Pancreatic cancer Neg Hx   . Rectal cancer Neg Hx   . Stomach cancer Neg Hx    Social History   Social History Narrative   Lives with husband and children. Works FT as Teaching laboratory technician.    Allergies as of 12/04/2019      Reactions   Heparin    Do not give per MD   Sulfa Antibiotics Hives      Medication List       Accurate as of December 04, 2019  8:56 AM. If you have any questions, ask your nurse or doctor.        STOP taking these medications   CALCIUM-VITAMIN D PO Stopped by: Howard Pouch, DO   Magnesium 200 MG Tabs Stopped by: Howard Pouch, DO     TAKE these medications   diclofenac 75 MG EC tablet Commonly known as: VOLTAREN Take 1 tablet (75 mg total) by mouth 2 (two) times daily.   FLUoxetine 20 MG tablet Commonly known as: PROZAC Take 1.5 tablets (30 mg total) by mouth daily. Needs office visit prior to any additional refills   LORazepam 0.5 MG tablet Commonly known as: ATIVAN Take 1 tablet (0.5 mg total) by mouth 2 (two) times daily as needed for anxiety. Started by: Howard Pouch, DO   multivitamin tablet Take 1 tablet by mouth daily.   propranolol 10 MG tablet Commonly known as: INDERAL Take 1 tablet (10 mg total) by mouth 2 (two) times daily. Needs appointment for additional refills.       All past medical history, surgical history, allergies, family history, immunizations andmedications were updated in the EMR today and reviewed under the history and medication portions of their EMR.      ROS: 14 pt review of systems performed and negative (unless mentioned in an HPI)  Objective: BP 108/70 (BP  Location: Right Arm, Cuff Size: Normal)   Pulse (!) 58   Temp 98 F (  36.7 C) (Temporal)   Resp 12   Ht 5\' 8"  (1.727 m)   Wt 172 lb 12.8 oz (78.4 kg)   LMP 01/20/2016 (Approximate)   SpO2 99%   BMI 26.27 kg/m  Gen: Afebrile. No acute distress.  HENT: AT. [place].  Psych: tearful. Otherwise, Normal affect, dress and demeanor. Normal speech. Normal thought content and judgment.    No exam data present  Assessment/plan: Lyda Colcord is a 54 y.o. female present for  Situational anxiety/Tremor/grief - normal grief reaction after the passing of her father. Discussed options to help her with her grief and she would like to try ativan. She has been on ativan in the past.  - Start ativan 0.5mg  BD PRN. [place] reviewed.  -Continue Prozac 30 mg QD  -Continue propanol 10 mg BID (tremor 2/2 to prozac).  - She has started therapy for her grief.  -Follow-up and her routine [facility] appts, sooner if needed.    No orders of the defined types were placed in this encounter.  Meds ordered this encounter  Medications  . LORazepam (ATIVAN) 0.5 MG tablet    Sig: Take 1 tablet (0.5 mg total) by mouth 2 (two) times daily as needed for anxiety.    Dispense:  60 tablet    Refill:  5   Referral Orders  No referral(s) requested today     Electronically signed by: Howard Pouch, [place]

## 2019-12-05 ENCOUNTER — Institutional Professional Consult (permissible substitution): Payer: BC Managed Care – PPO | Admitting: Pulmonary Disease

## 2020-01-08 ENCOUNTER — Ambulatory Visit (INDEPENDENT_AMBULATORY_CARE_PROVIDER_SITE_OTHER): Payer: BC Managed Care – PPO | Admitting: Family Medicine

## 2020-01-08 ENCOUNTER — Other Ambulatory Visit: Payer: Self-pay

## 2020-01-08 DIAGNOSIS — Z23 Encounter for immunization: Secondary | ICD-10-CM

## 2020-01-08 DIAGNOSIS — M2550 Pain in unspecified joint: Secondary | ICD-10-CM

## 2020-01-08 MED ORDER — DICLOFENAC SODIUM 75 MG PO TBEC: 75.0000 mg | DELAYED_RELEASE_TABLET | Freq: Two times a day (BID) | ORAL | 0 refills | Status: DC

## 2020-03-19 ENCOUNTER — Other Ambulatory Visit: Payer: Self-pay

## 2020-03-19 MED ORDER — PROPRANOLOL HCL 10 MG PO TABS: 10.0000 mg | ORAL_TABLET | Freq: Two times a day (BID) | ORAL | 1 refills | Status: DC

## 2020-06-17 ENCOUNTER — Encounter: Payer: Self-pay | Admitting: Family Medicine

## 2020-06-17 ENCOUNTER — Other Ambulatory Visit: Payer: Self-pay

## 2020-06-17 ENCOUNTER — Ambulatory Visit: Payer: BC Managed Care – PPO | Admitting: Family Medicine

## 2020-06-17 VITALS — BP 108/74 | HR 66 | Temp 98.8°F | Ht 67.0 in | Wt 173.0 lb

## 2020-06-17 DIAGNOSIS — M2550 Pain in unspecified joint: Secondary | ICD-10-CM

## 2020-06-17 DIAGNOSIS — R251 Tremor, unspecified: Secondary | ICD-10-CM | POA: Diagnosis not present

## 2020-06-17 DIAGNOSIS — F4321 Adjustment disorder with depressed mood: Secondary | ICD-10-CM | POA: Diagnosis not present

## 2020-06-17 DIAGNOSIS — M545 Low back pain, unspecified: Secondary | ICD-10-CM

## 2020-06-17 DIAGNOSIS — M25552 Pain in left hip: Secondary | ICD-10-CM

## 2020-06-17 DIAGNOSIS — F418 Other specified anxiety disorders: Secondary | ICD-10-CM

## 2020-06-17 DIAGNOSIS — M25551 Pain in right hip: Secondary | ICD-10-CM

## 2020-06-17 MED ORDER — DICLOFENAC SODIUM 75 MG PO TBEC
75.0000 mg | DELAYED_RELEASE_TABLET | Freq: Two times a day (BID) | ORAL | 1 refills | Status: DC
Start: 2020-06-17 — End: 2020-12-19

## 2020-06-17 MED ORDER — FLUOXETINE HCL 20 MG PO TABS: 30.0000 mg | ORAL_TABLET | Freq: Every day | ORAL | 1 refills | Status: DC

## 2020-06-17 MED ORDER — PROPRANOLOL HCL 10 MG PO TABS: 10.0000 mg | ORAL_TABLET | Freq: Two times a day (BID) | ORAL | 1 refills | Status: DC

## 2020-06-17 MED ORDER — LORAZEPAM 0.5 MG PO TABS: 0.5000 mg | ORAL_TABLET | Freq: Two times a day (BID) | ORAL | 5 refills | Status: DC | PRN

## 2020-06-17 NOTE — Progress Notes (Signed)
This visit occurred during the SARS-CoV-2 public health emergency.  Safety protocols were in place, including screening questions prior to the visit, additional usage of staff PPE, and extensive cleaning of exam room while observing appropriate contact time as indicated for disinfecting solutions.    Patient ID: Brenda Foster, female  DOB: 1966-02-10, 54 y.o.   MRN: [id number] Patient Care Team    Relationship Specialty Notifications Start End  Natalia Leatherwood, DO PCP - General Family Medicine  05/12/16   Lendon Colonel, DO Referring Physician   05/12/16    Comment: psych  Maxie Better, MD Consulting Physician Obstetrics and Gynecology  05/12/16     Chief Complaint  Patient presents with  . Follow-up    [facility]; pt is not fasting    Subjective: Brenda Foster is a 54 y.o.  Female  present for [facility] Anxiety/tremor/grief:.  Patient reports she feels her medications are working well on Prozac 30 mg daily and the propranolol 10 mg twice daily. She will occasional take an ativan when feeling more depressed or anxious. She reports occasional continue grief and tearfulness when thinkingof her father that passed earlier in the year.  Original note:  Pt presents for an OV with complaints of anxiety and tremor from prozac of 1.5 year duration.  Condition is well controlled on prozac 30 mg and Propanolol 10 mg BID. Her prior psychiatric provider has moved and she would like to come here for her care surrounding this issue.   Pain: She reports compliance with diclofenac. She states this helps a great deal to keep her comfortable and active.    Depression screen Brenda Foster 2/9 10/06/2019 03/01/2019 08/01/2018 08/27/2017 05/12/2016  Decreased Interest 0 0 0 0 0  Down, Depressed, Hopeless 0 0 0 0 0  PHQ - 2 Score 0 0 0 0 0  Altered sleeping 0 0 0 1 -  Tired, decreased energy 0 0 0 0 -  Change in appetite 0 0 1 0 -  Feeling bad or failure about yourself  0 0 0 0 -  Trouble concentrating 3 0 0 1 -   Moving slowly or fidgety/restless 0 0 0 0 -  Suicidal thoughts 0 0 0 0 -  PHQ-9 Score 3 0 1 2 -  Difficult doing work/chores Not difficult at all Not difficult at all Not difficult at all - -   GAD 7 : Generalized Anxiety Score 10/06/2019 03/01/2019 08/01/2018 08/27/2017  Nervous, Anxious, on Edge 0 0 1 1  Control/stop worrying 0 0 0 0  Worry too much - different things 0 0 0 0  Trouble relaxing 0 0 1 0  Restless 0 0 0 0  Easily annoyed or irritable 0 1 0 0  Afraid - awful might happen 0 0 0 0  Total GAD 7 Score 0 1 2 1   Anxiety Difficulty Not difficult at all Not difficult at all Not difficult at all Not difficult at all    Immunization History  Administered Date(s) Administered  . Influenza,inj,Quad PF,6+ Mos 02/28/2014, 05/15/2018, 02/28/2019, 05/17/2020  . PFIZER SARS-COV-2 Vaccination 08/17/2019, 09/08/2019, 03/30/2020  . Tdap 06/22/1996, 10/07/2013, 04/06/2014  . Zoster Recombinat (Shingrix) 10/06/2019, 01/08/2020    Past Medical History:  Diagnosis Date  . Anxiety   . Depression    Mild n/ infertility  . History of miscarriage   . Infertility   . Interstitial cystitis   . Low back pain   . Migraines   . Sepsis ([facility]) 12/2016   in hospital  1 week  . Urinary tract bacterial infections    2x  . Urine incontinence    Mild   Allergies  Allergen Reactions  . Heparin     Do not give per MD  . Sulfa Antibiotics Hives   Past Surgical History:  Procedure Laterality Date  . APPENDECTOMY  1980  . BREAST SURGERY  1986   Reduction  . CERVICAL CONE BIOPSY  1990  . CESAREAN SECTION  2003   1 time  . COLONOSCOPY  09/30/2017   polyps  . DILATION AND CURETTAGE OF UTERUS  1999-2002  . HYSTEROSCOPY  2001  . LAPAROSCOPY  2001  . LASIK    . TUBAL LIGATION  2003  . WISDOM TOOTH EXTRACTION     Family History  Problem Relation Age of Onset  . Cancer Father        prostate  . Heart disease Father 54       MI, premature  . Stroke Father        Mind  . Prostate cancer  Father   . Stroke Maternal Grandfather   . Anemia Mother   . Autism spectrum disorder Daughter   . Stroke Maternal Grandmother   . Colon cancer Neg Hx   . Esophageal cancer Neg Hx   . Liver cancer Neg Hx   . Pancreatic cancer Neg Hx   . Rectal cancer Neg Hx   . Stomach cancer Neg Hx    Social History   Social History Narrative   Lives with husband and children. Works FT as Social research officer, government.    Allergies as of 06/17/2020      Reactions   Heparin    Do not give per MD   Sulfa Antibiotics Hives      Medication List       Accurate as of June 17, 2020 10:08 AM. If you have any questions, ask your nurse or doctor.        diclofenac 75 MG EC tablet Commonly known as: VOLTAREN Take 1 tablet (75 mg total) by mouth 2 (two) times daily.   FLUoxetine 20 MG tablet Commonly known as: PROZAC Take 1.5 tablets (30 mg total) by mouth daily. Needs office visit prior to any additional refills   LORazepam 0.5 MG tablet Commonly known as: ATIVAN Take 1 tablet (0.5 mg total) by mouth 2 (two) times daily as needed for anxiety.   multivitamin tablet Take 1 tablet by mouth daily.   propranolol 10 MG tablet Commonly known as: INDERAL Take 1 tablet (10 mg total) by mouth 2 (two) times daily. Needs appointment for additional refills.       All past medical history, surgical history, allergies, family history, immunizations andmedications were updated in the EMR today and reviewed under the history and medication portions of their EMR.      ROS: 14 pt review of systems performed and negative (unless mentioned in an HPI)  Objective: BP 108/74   Pulse 66   Temp 98.8 F (37.1 C) (Oral)   Ht 5\' 7"  (1.702 m)   Wt 173 lb (78.5 kg)   LMP 01/20/2016 (Approximate)   SpO2 99%   BMI 27.10 kg/m  Gen: Afebrile. No acute distress. Nontoxic, pleasant female.  HENT: AT. [place].  Eyes:Pupils Equal Round Reactive to light, Extraocular movements intact,  Conjunctiva without redness,  discharge or icterus. CV: RRR  Chest: CTAB, no wheeze or crackles Neuro:  Normal gait. PERLA. EOMi. Alert. Oriented. x3 Psych: Normal affect, dress and demeanor.  Normal speech. Normal thought content and judgment.    No exam data present  Assessment/plan: Nao Linz is a 54 y.o. female present for  Situational anxiety/Tremor/grief -stable.  - continue  ativan 0.5mg  BD PRN. - [facility] database reviewed 06/17/20  -Continue Prozac 30 mg QD  -Continue propanol 10 mg BID (tremor 2/2 to prozac).  - She has started therapy for her grief.  -Follow-up 5.5 mos for CPE/[facility]  Lumbar pain/Bilateral hip pain/Polyarthralgia Stable.  Continue diclofenac.    No orders of the defined types were placed in this encounter.  Meds ordered this encounter  Medications  . FLUoxetine (PROZAC) 20 MG tablet    Sig: Take 1.5 tablets (30 mg total) by mouth daily. Needs office visit prior to any additional refills    Dispense:  135 tablet    Refill:  1  . propranolol (INDERAL) 10 MG tablet    Sig: Take 1 tablet (10 mg total) by mouth 2 (two) times daily. Needs appointment for additional refills.    Dispense:  180 tablet    Refill:  1  . diclofenac (VOLTAREN) 75 MG EC tablet    Sig: Take 1 tablet (75 mg total) by mouth 2 (two) times daily.    Dispense:  180 tablet    Refill:  1  . LORazepam (ATIVAN) 0.5 MG tablet    Sig: Take 1 tablet (0.5 mg total) by mouth 2 (two) times daily as needed for anxiety.    Dispense:  60 tablet    Refill:  5   Referral Orders  No referral(s) requested today     Electronically signed by: Howard Pouch, [place]

## 2020-06-17 NOTE — Patient Instructions (Signed)
   Great to see you today.  I have refilled your medications.  Next appt in 5.5 mos for physical and chronic conditions.

## 2020-12-15 ENCOUNTER — Other Ambulatory Visit: Payer: Self-pay | Admitting: Family Medicine

## 2020-12-15 DIAGNOSIS — M2550 Pain in unspecified joint: Secondary | ICD-10-CM

## 2020-12-17 ENCOUNTER — Other Ambulatory Visit: Payer: Self-pay | Admitting: Family Medicine

## 2020-12-19 ENCOUNTER — Ambulatory Visit (INDEPENDENT_AMBULATORY_CARE_PROVIDER_SITE_OTHER): Payer: BC Managed Care – PPO | Admitting: Family Medicine

## 2020-12-19 ENCOUNTER — Other Ambulatory Visit: Payer: Self-pay

## 2020-12-19 ENCOUNTER — Encounter: Payer: Self-pay | Admitting: Family Medicine

## 2020-12-19 VITALS — BP 110/69 | HR 57 | Temp 98.5°F | Ht 66.25 in | Wt 171.0 lb

## 2020-12-19 DIAGNOSIS — R251 Tremor, unspecified: Secondary | ICD-10-CM | POA: Diagnosis not present

## 2020-12-19 DIAGNOSIS — M545 Low back pain, unspecified: Secondary | ICD-10-CM | POA: Diagnosis not present

## 2020-12-19 DIAGNOSIS — F418 Other specified anxiety disorders: Secondary | ICD-10-CM

## 2020-12-19 DIAGNOSIS — Z1231 Encounter for screening mammogram for malignant neoplasm of breast: Secondary | ICD-10-CM

## 2020-12-19 DIAGNOSIS — Z130 Encounter for screening for diseases of the blood and blood-forming organs and certain disorders involving the immune mechanism: Secondary | ICD-10-CM | POA: Diagnosis not present

## 2020-12-19 DIAGNOSIS — Z131 Encounter for screening for diabetes mellitus: Secondary | ICD-10-CM

## 2020-12-19 DIAGNOSIS — M2550 Pain in unspecified joint: Secondary | ICD-10-CM

## 2020-12-19 DIAGNOSIS — Z0000 Encounter for general adult medical examination without abnormal findings: Secondary | ICD-10-CM

## 2020-12-19 DIAGNOSIS — Z13220 Encounter for screening for lipoid disorders: Secondary | ICD-10-CM | POA: Diagnosis not present

## 2020-12-19 LAB — CBC WITH DIFFERENTIAL/PLATELET
Basophils Absolute: 0 10*3/uL (ref 0.0–0.1)
Basophils Relative: 0.7 % (ref 0.0–3.0)
Eosinophils Absolute: 0.1 10*3/uL (ref 0.0–0.7)
Eosinophils Relative: 2.2 % (ref 0.0–5.0)
HCT: 38.3 % (ref 36.0–46.0)
Hemoglobin: 13.2 g/dL (ref 12.0–15.0)
Lymphocytes Relative: 33.7 % (ref 12.0–46.0)
Lymphs Abs: 1.9 10*3/uL (ref 0.7–4.0)
MCHC: 34.4 g/dL (ref 30.0–36.0)
MCV: 99.5 fl (ref 78.0–100.0)
Monocytes Absolute: 0.4 10*3/uL (ref 0.1–1.0)
Monocytes Relative: 6.9 % (ref 3.0–12.0)
Neutro Abs: 3.2 10*3/uL (ref 1.4–7.7)
Neutrophils Relative %: 56.5 % (ref 43.0–77.0)
Platelets: 189 10*3/uL (ref 150.0–400.0)
RBC: 3.85 Mil/uL — ABNORMAL LOW (ref 3.87–5.11)
RDW: 12.6 % (ref 11.5–15.5)
WBC: 5.7 10*3/uL (ref 4.0–10.5)

## 2020-12-19 LAB — COMPREHENSIVE METABOLIC PANEL
ALT: 21 U/L (ref 0–35)
AST: 17 U/L (ref 0–37)
Albumin: 4.3 g/dL (ref 3.5–5.2)
Alkaline Phosphatase: 117 U/L (ref 39–117)
BUN: 15 mg/dL (ref 6–23)
CO2: 29 mEq/L (ref 19–32)
Calcium: 9.5 mg/dL (ref 8.4–10.5)
Chloride: 104 mEq/L (ref 96–112)
Creatinine, Ser: 0.9 mg/dL (ref 0.40–1.20)
GFR: 71.74 mL/min (ref >60.00)
Glucose, Bld: 76 mg/dL (ref 70–99)
Potassium: 4 mEq/L (ref 3.5–5.1)
Sodium: 141 mEq/L (ref 135–145)
Total Bilirubin: 0.5 mg/dL (ref 0.2–1.2)
Total Protein: 7 g/dL (ref 6.0–8.3)

## 2020-12-19 LAB — LIPID PANEL
Cholesterol: 239 mg/dL — ABNORMAL HIGH (ref 0–200)
HDL: 37.5 mg/dL — ABNORMAL LOW (ref >39.00)
NonHDL: 201.12
Total CHOL/HDL Ratio: 6
Triglycerides: 214 mg/dL — ABNORMAL HIGH (ref 0.0–149.0)
VLDL: 42.8 mg/dL — ABNORMAL HIGH (ref 0.0–40.0)

## 2020-12-19 LAB — HEMOGLOBIN A1C: Hgb A1c MFr Bld: 5.4 % (ref 4.6–6.5)

## 2020-12-19 LAB — TSH: TSH: 0.97 u[IU]/mL (ref 0.35–5.50)

## 2020-12-19 LAB — LDL CHOLESTEROL, DIRECT: Direct LDL: 145 mg/dL

## 2020-12-19 MED ORDER — FLUOXETINE HCL 20 MG PO TABS: 40.0000 mg | ORAL_TABLET | Freq: Every day | ORAL | 1 refills | Status: DC

## 2020-12-19 MED ORDER — DICLOFENAC SODIUM 75 MG PO TBEC: 75.0000 mg | DELAYED_RELEASE_TABLET | Freq: Two times a day (BID) | ORAL | 1 refills | Status: DC

## 2020-12-19 MED ORDER — PROPRANOLOL HCL 10 MG PO TABS: 10.0000 mg | ORAL_TABLET | Freq: Two times a day (BID) | ORAL | 1 refills | Status: DC

## 2020-12-19 MED ORDER — LORAZEPAM 0.5 MG PO TABS: 0.5000 mg | ORAL_TABLET | Freq: Two times a day (BID) | ORAL | 5 refills | Status: DC | PRN

## 2020-12-19 NOTE — Progress Notes (Signed)
This visit occurred during the SARS-CoV-2 public health emergency.  Safety protocols were in place, including screening questions prior to the visit, additional usage of staff PPE, and extensive cleaning of exam room while observing appropriate contact time as indicated for disinfecting solutions.    Patient ID: Brenda Foster, female  DOB: Feb 14, 1966, 55 y.o.   MRN: [id number] Patient Care Team    Relationship Specialty Notifications Start End  Ma Hillock, DO PCP - General Family Medicine  05/12/16   Dineen Kid, DO Referring Physician   05/12/16    Comment: psych  Servando [place], MD Consulting Physician Obstetrics and Gynecology  05/12/16     Chief Complaint  Patient presents with   Annual Exam    Pt is fasting;     Subjective:  Brenda Foster is a 55 y.o.  Female  present for CPE/CMC. All past medical history, surgical history, allergies, family history, immunizations, medications and social history were updated in the electronic medical record today. All recent labs, ED visits and hospitalizations within the last year were reviewed.  Health maintenance:  Colonoscopy: completed 12/2018 Dr. Silverio Decamp- rpt 5 yr.  Mammogram: completed:2020 [facility] Cervical cancer screening: last pap:2019, results:normal per pt, completed by: Dr. Garwin Brothers.  Immunizations: tdap UTD 2015, Influenza UTD 2021 (encouraged yearly), shingrix  completed covid pfizer completed, covid x3- discussed 2nd booster Infectious disease screening: HIV completed 2015; hep c completed DEXA:N/A consider at 18 for estrogen deficiency (perimenopausal) and vit d deficiency. Takes multivitamin with Vit D only.  Assistive device: none Oxygen DGU:YQIH Patient has a Dental home. Hospitalizations/ED visits: reviewed  Anxiety/tremor/grief:.  Patient reports she feels her medications are working ok for on Prozac 30 mg daily and the propranolol 10 mg twice daily. She will occasionally take an ativan when  feeling more depressed or anxious. She reports occasional continue grief and tearfulness when thinkingof her father that passed earlier in the year. She is also going through a separation with her husband and moved out in March.  Original note: Pt presents for an OV with complaints of anxiety and tremor from prozac of 1.5 year duration.  Condition is well controlled on prozac 30 mg and Propanolol 10 mg BID. Her prior psychiatric provider has moved and she would like to come here for her care surrounding this issue.    Pain: She reports compliance with diclofenac. She states this helps keep her active.   Depression screen [facility] 2/9 12/19/2020 06/17/2020 10/06/2019 03/01/2019 08/01/2018  Decreased Interest 1 0 0 0 0  Down, Depressed, Hopeless 1 1 0 0 0  PHQ - 2 Score 2 1 0 0 0  Altered sleeping 0 1 0 0 0  Tired, decreased energy 1 1 0 0 0  Change in appetite 2 2 0 0 1  Feeling bad or failure about yourself  1 1 0 0 0  Trouble concentrating 0 1 3 0 0  Moving slowly or fidgety/restless 0 0 0 0 0  Suicidal thoughts - 0 0 0 0  PHQ-9 Score 6 7 3  0 1  Difficult doing work/chores - - Not difficult at all Not difficult at all Not difficult at all   GAD 7 : Generalized Anxiety Score 12/19/2020 06/17/2020 10/06/2019 03/01/2019  Nervous, Anxious, on Edge 3 0 0 0  Control/stop worrying 2 1 0 0  Worry too much - different things 0 1 0 0  Trouble relaxing 1 1 0 0  Restless 0 0 0 0  Easily annoyed or irritable  2 0 0 1  Afraid - awful might happen 0 0 0 0  Total GAD 7 Score 8 3 0 1  Anxiety Difficulty - - Not difficult at all Not difficult at all    Immunization History  Administered Date(s) Administered   Influenza,inj,Quad PF,6+ Mos 02/28/2014, 05/15/2018, 02/28/2019, 05/17/2020   PFIZER(Purple Top)SARS-COV-2 Vaccination 08/17/2019, 09/08/2019, 03/30/2020   Tdap 06/22/1996, 10/07/2013, 04/06/2014   Zoster Recombinat (Shingrix) 10/06/2019, 01/08/2020   Past Medical History:  Diagnosis Date   Anxiety     Depression    Mild n/ infertility   History of miscarriage    Infertility    Interstitial cystitis    Low back pain    Migraines    Sepsis ([place]) 12/2016   in hospital 1 week   Urinary tract bacterial infections    2x   Urine incontinence    Mild   Allergies  Allergen Reactions   Heparin     Do not give per MD   Sulfa Antibiotics Hives   Past Surgical History:  Procedure Laterality Date   [place]   Reduction   CERVICAL CONE BIOPSY  1990   CESAREAN SECTION  2003   1 time   COLONOSCOPY  09/30/2017   polyps   DILATION AND CURETTAGE OF UTERUS  1999-2002   HYSTEROSCOPY  2001   LAPAROSCOPY  2001   LASIK     TUBAL LIGATION  2003   WISDOM TOOTH EXTRACTION     Family History  Problem Relation Age of Onset   Cancer Father        prostate   Heart disease Father 8       MI, premature   Stroke Father        Mind   Prostate cancer Father    Stroke Maternal Grandfather    Anemia Mother    Autism spectrum disorder Daughter    Stroke Maternal Grandmother    Colon cancer Neg Hx    Esophageal cancer Neg Hx    Liver cancer Neg Hx    Pancreatic cancer Neg Hx    Rectal cancer Neg Hx    Stomach cancer Neg Hx    Social History   Social History Narrative   Lives with husband and children. Works FT as Teaching laboratory technician.    Allergies as of 12/19/2020       Reactions   Heparin    Do not give per MD   Sulfa Antibiotics Hives        Medication List        Accurate as of December 19, 2020 11:03 AM. If you have any questions, ask your nurse or doctor.          diclofenac 75 MG EC tablet Commonly known as: VOLTAREN Take 1 tablet (75 mg total) by mouth 2 (two) times daily.   FLUoxetine 20 MG tablet Commonly known as: PROZAC Take 2 tablets (40 mg total) by mouth daily. What changed:  how much to take additional instructions Changed by: Howard Pouch, DO   LORazepam 0.5 MG tablet Commonly known as: ATIVAN Take 1 tablet (0.5  mg total) by mouth 2 (two) times daily as needed for anxiety.   multivitamin tablet Take 1 tablet by mouth daily.   propranolol 10 MG tablet Commonly known as: INDERAL Take 1 tablet (10 mg total) by mouth 2 (two) times daily. Needs appointment for additional refills.        All past  medical history, surgical history, allergies, family history, immunizations andmedications were updated in the EMR today and reviewed under the history and medication portions of their EMR.     No results found for this or any previous visit (from the past 2160 hour(s)).   ROS: 14 pt review of systems performed and negative (unless mentioned in an HPI)  Objective: BP 110/69   Pulse (!) 57   Temp 98.5 F (36.9 C) (Oral)   Ht 5' 6.25" (1.683 m)   Wt 171 lb (77.6 kg)   LMP 01/20/2016 (Approximate)   SpO2 100%   BMI 27.39 kg/m  Gen: Afebrile. No acute distress. Nontoxic in appearance, well-developed, well-nourished,  very pleasant, mildly overweight female.  HENT: AT. [place]. Bilateral TM visualized and normal in appearance, normal external auditory canal. MMM, no oral lesions, adequate dentition. Bilateral nares within normal limits. Throat without erythema, ulcerations or exudates. no Cough on exam, no hoarseness on exam. Eyes:Pupils Equal Round Reactive to light, Extraocular movements intact,  Conjunctiva without redness, discharge or icterus. Neck/lymp/endocrine: Supple,no lymphadenopathy, no thyromegaly CV: RRR no murmur, no edema, +2/4 P posterior tibialis pulses.  Chest: CTAB, no wheeze, rhonchi or crackles. normal Respiratory effort. good Air movement. Abd: Soft. flat. NTND. BS present. no Masses palpated. No hepatosplenomegaly. No rebound tenderness or guarding. Skin: no rashes, purpura or petechiae. Warm and well-perfused. Skin intact. Neuro/Msk:  Normal gait. PERLA. EOMi. Alert. Oriented x3.  Cranial nerves II through XII intact. Muscle strength 5/5 upper/lower extremity. DTRs equal  bilaterally. Psych: Normal affect, dress and demeanor. Normal speech. Normal thought content and judgment.  No results found.  Assessment/plan: Brenda Foster is a 54 y.o. female present for CPE/CMC Situational anxiety/Tremor/grief - stable.  - increase  ativan 0.5mg  TID PRN. - NCCS database reviewed today - increase Prozac 30 mg > 40 mg QD - continue propanol 10 mg BID (tremor 2/2 to prozac).  - She has started therapy for her grief. -Follow-up 5.5 mos for [facility]   Lumbar pain/Bilateral hip pain/Polyarthralgia stable Continue diclofenac Breast cancer screening by mammogram - MM 3D SCREEN BREAST BILATERAL; Future Diabetes mellitus screening - Hemoglobin A1c - Comprehensive metabolic panel Lipid screening - Lipid panel Screening for deficiency anemia - CBC with Differential/Platelet Routine general medical examination at a health care facility Colonoscopy: completed 12/2018 Dr. Silverio Decamp- rpt 5 yr.  Mammogram: completed:2020 [facility] Cervical cancer screening: last pap:2019, results:normal per pt, completed by: Dr. Garwin Brothers.  Immunizations: tdap UTD 2015, Influenza UTD 2021 (encouraged yearly), shingrix  completed covid pfizer completed, covid x3- discussed 2nd booster Infectious disease screening: HIV completed 2015; hep c completed DEXA:N/A consider at 27 for estrogen deficiency (perimenopausal) and vit d deficiency. Takes multivitamin with Vit D only.  Patient was encouraged to exercise greater than 150 minutes a week. Patient was encouraged to choose a diet filled with fresh fruits and vegetables, and lean meats. AVS provided to patient today for education/recommendation on gender specific health and safety maintenance.  Return in about 5 months (around 06/04/2021) for CMC (30 min) and 1 yr1d cpe.  Orders Placed This Encounter  Procedures   MM 3D SCREEN BREAST BILATERAL   CBC with Differential/Platelet   Hemoglobin A1c   Comprehensive metabolic panel   Lipid panel    TSH     Orders Placed This Encounter  Procedures   MM 3D SCREEN BREAST BILATERAL   CBC with Differential/Platelet   Hemoglobin A1c   Comprehensive metabolic panel   Lipid panel   TSH  Meds ordered this encounter  Medications   diclofenac (VOLTAREN) 75 MG EC tablet    Sig: Take 1 tablet (75 mg total) by mouth 2 (two) times daily.    Dispense:  180 tablet    Refill:  1   FLUoxetine (PROZAC) 20 MG tablet    Sig: Take 2 tablets (40 mg total) by mouth daily.    Dispense:  180 tablet    Refill:  1   propranolol (INDERAL) 10 MG tablet    Sig: Take 1 tablet (10 mg total) by mouth 2 (two) times daily. Needs appointment for additional refills.    Dispense:  180 tablet    Refill:  1   LORazepam (ATIVAN) 0.5 MG tablet    Sig: Take 1 tablet (0.5 mg total) by mouth 2 (two) times daily as needed for anxiety.    Dispense:  90 tablet    Refill:  5    Referral Orders  No referral(s) requested today     Electronically signed by: Howard Pouch, [place]

## 2020-12-19 NOTE — Patient Instructions (Signed)
Great to see you today.  I have refilled the medication(s) we provide.   If labs were collected, we will inform you of lab results once received either by echart message or telephone call.   - echart message- for normal results that have been seen by the patient already.   - telephone call: abnormal results or if patient has not viewed results in their echart.   Health Maintenance, Female Adopting a healthy lifestyle and getting preventive care are important in promoting health and wellness. Ask your health care provider about: The right schedule for you to have regular tests and exams. Things you can do on your own to prevent diseases and keep yourself healthy. What should I know about diet, weight, and exercise? Eat a healthy diet  Eat a diet that includes plenty of vegetables, fruits, low-fat dairy products, and lean protein. Do not eat a lot of foods that are high in solid fats, added sugars, or sodium.  Maintain a healthy weight Body mass index (BMI) is used to identify weight problems. It estimates body fat based on height and weight. Your health care provider can help determineyour BMI and help you achieve or maintain a healthy weight. Get regular exercise Get regular exercise. This is one of the most important things you can do for your health. Most adults should: Exercise for at least 150 minutes each week. The exercise should increase your heart rate and make you sweat (moderate-intensity exercise). Do strengthening exercises at least twice a week. This is in addition to the moderate-intensity exercise. Spend less time sitting. Even light physical activity can be beneficial. Watch cholesterol and blood lipids Have your blood tested for lipids and cholesterol at 56 years of age, then havethis test every 5 years. Have your cholesterol levels checked more often if: Your lipid or cholesterol levels are high. You are older than 56 years of age. You are at high risk for heart  disease. What should I know about cancer screening? Depending on your health history and family history, you may need to have cancer screening at various ages. This may include screening for: Breast cancer. Cervical cancer. Colorectal cancer. Skin cancer. Lung cancer. What should I know about heart disease, diabetes, and high blood pressure? Blood pressure and heart disease High blood pressure causes heart disease and increases the risk of stroke. This is more likely to develop in people who have high blood pressure readings, are of African descent, or are overweight. Have your blood pressure checked: Every 3-5 years if you are 18-39 years of age. Every year if you are 40 years old or older. Diabetes Have regular diabetes screenings. This checks your fasting blood sugar level. Have the screening done: Once every three years after age 40 if you are at a normal weight and have a low risk for diabetes. More often and at a younger age if you are overweight or have a high risk for diabetes. What should I know about preventing infection? Hepatitis B If you have a higher risk for hepatitis B, you should be screened for this virus. Talk with your health care provider to find out if you are at risk forhepatitis B infection. Hepatitis C Testing is recommended for: Everyone born from 1945 through 1965. Anyone with known risk factors for hepatitis C. Sexually transmitted infections (STIs) Get screened for STIs, including gonorrhea and chlamydia, if: You are sexually active and are younger than 56 years of age. You are older than 56 years of age and your   health care provider tells you that you are at risk for this type of infection. Your sexual activity has changed since you were last screened, and you are at increased risk for chlamydia or gonorrhea. Ask your health care provider if you are at risk. Ask your health care provider about whether you are at high risk for HIV. Your health care provider  may recommend a prescription medicine to help prevent HIV infection. If you choose to take medicine to prevent HIV, you should first get tested for HIV. You should then be tested every 3 months for as long as you are taking the medicine. Pregnancy If you are about to stop having your period (premenopausal) and you may become pregnant, seek counseling before you get pregnant. Take 400 to 800 micrograms (mcg) of folic acid every day if you become pregnant. Ask for birth control (contraception) if you want to prevent pregnancy. Osteoporosis and menopause Osteoporosis is a disease in which the bones lose minerals and strength with aging. This can result in bone fractures. If you are 65 years old or older, or if you are at risk for osteoporosis and fractures, ask your health care provider if you should: Be screened for bone loss. Take a calcium or vitamin D supplement to lower your risk of fractures. Be given hormone replacement therapy (HRT) to treat symptoms of menopause. Follow these instructions at home: Lifestyle Do not use any products that contain nicotine or tobacco, such as cigarettes, e-cigarettes, and chewing tobacco. If you need help quitting, ask your health care provider. Do not use street drugs. Do not share needles. Ask your health care provider for help if you need support or information about quitting drugs. Alcohol use Do not drink alcohol if: Your health care provider tells you not to drink. You are pregnant, may be pregnant, or are planning to become pregnant. If you drink alcohol: Limit how much you use to 0-1 drink a day. Limit intake if you are breastfeeding. Be aware of how much alcohol is in your drink. In the [place] one drink equals one 12 oz bottle of beer (355 mL), one 5 oz glass of wine (148 mL), or one 1 oz glass of hard liquor (44 mL). General instructions Schedule regular health, dental, and eye exams. Stay current with your vaccines. Tell your health care  provider if: You often feel depressed. You have ever been abused or do not feel safe at home. Summary Adopting a healthy lifestyle and getting preventive care are important in promoting health and wellness. Follow your health care provider's instructions about healthy diet, exercising, and getting tested or screened for diseases. Follow your health care provider's instructions on monitoring your cholesterol and blood pressure. This information is not intended to replace advice given to you by your health care provider. Make sure you discuss any questions you have with your healthcare provider. Document Revised: 06/01/2018 Document Reviewed: 06/01/2018 Elsevier Patient Education  2022 Elsevier Inc.  

## 2020-12-20 ENCOUNTER — Telehealth: Payer: Self-pay | Admitting: Family Medicine

## 2020-12-20 DIAGNOSIS — Z8249 Family history of ischemic heart disease and other diseases of the circulatory system: Secondary | ICD-10-CM

## 2020-12-20 MED ORDER — ATORVASTATIN CALCIUM 10 MG PO TABS: 10.0000 mg | ORAL_TABLET | Freq: Every day | ORAL | 3 refills | Status: DC

## 2020-12-20 MED ORDER — ATORVASTATIN CALCIUM 10 MG PO TABS: 10.0000 mg | ORAL_TABLET | Freq: Every evening | ORAL | 3 refills | Status: DC

## 2020-12-20 NOTE — Telephone Encounter (Signed)
Please instruct her to take atorvastatin 1 tab before bed and make sure she has her 80-month follow-up.  Thanks

## 2020-12-20 NOTE — Telephone Encounter (Signed)
Spoke with pt regarding rx instructions, she already has 3 month f/u scheduled for 10/25

## 2020-12-20 NOTE — Telephone Encounter (Signed)
Please call patient Liver, kidney and thyroid function are normal Blood cell counts and electrolytes are normal Diabetes screening/A1c is normal  Cholesterol panel is above goal.    -Her triglycerides are 214, normal is less than 150.   -Her LDL/bad cholesterol is 145.  This was 150 last year.  Goal for her should be closer to 100 . These levels do put her at higher risk of cardiovascular event such as heart attack or stroke the normal population, especially with her family history of heart disease.  By American Heart Association criteria she would benefit from a medication start for her cholesterol to help her decrease her cholesterol and also provide her with cardiovascular protection. If she is agreeable to start statin medication, I will call this in for her today and we would follow-up in 3 months with provider and repeat fasting labs.  In addition to medication, recommendations are to increase exercise to greater than 150 minutes a week of cardiovascular exercise.  Avoid use of butter, consuming fatty meats and red meat frequently.  Decrease saturated fats in diet.  Increase fiber.  Focus on fresh fruits vegetables, lean meats and exercise.

## 2020-12-20 NOTE — Addendum Note (Signed)
Addended by: Howard Pouch A on: 12/20/2020 12:10 PM   Modules accepted: Orders

## 2020-12-20 NOTE — Telephone Encounter (Signed)
LVM for pt to CB regarding meds.  

## 2020-12-20 NOTE — Telephone Encounter (Signed)
Spoke with pt regarding labs and instructions. Pt agreed to start meds

## 2021-02-22 ENCOUNTER — Ambulatory Visit
Admission: RE | Admit: 2021-02-22 | Discharge: 2021-02-22 | Disposition: A | Discharge status: Home / Self Care | Payer: BC Managed Care – PPO | Source: Ambulatory Visit | Attending: Family Medicine | Admitting: Family Medicine

## 2021-02-22 ENCOUNTER — Other Ambulatory Visit: Payer: Self-pay

## 2021-02-22 DIAGNOSIS — Z1231 Encounter for screening mammogram for malignant neoplasm of breast: Secondary | ICD-10-CM

## 2021-02-25 ENCOUNTER — Telehealth: Payer: BC Managed Care – PPO | Admitting: Physician Assistant

## 2021-02-25 DIAGNOSIS — U071 COVID-19: Secondary | ICD-10-CM | POA: Diagnosis not present

## 2021-02-25 MED ORDER — MOLNUPIRAVIR EUA 200MG CAPSULE: 4.0000 | ORAL_CAPSULE | Freq: Two times a day (BID) | ORAL | 0 refills | Status: AC

## 2021-02-25 MED ORDER — BENZONATATE 100 MG PO CAPS: 100.0000 mg | ORAL_CAPSULE | Freq: Three times a day (TID) | ORAL | 0 refills | Status: DC | PRN

## 2021-02-25 NOTE — Progress Notes (Signed)
Virtual Visit Consent   Brenda Foster, you are scheduled for a virtual visit with a [facility] provider today.     Just as with appointments in the office, your consent must be obtained to participate.  Your consent will be active for this visit and any virtual visit you may have with one of our providers in the next 365 days.     If you have a MyChart account, a copy of this consent can be sent to you electronically.  All virtual visits are billed to your insurance company just like a traditional visit in the office.    As this is a virtual visit, video technology does not allow for your provider to perform a traditional examination.  This may limit your provider's ability to fully assess your condition.  If your provider identifies any concerns that need to be evaluated in person or the need to arrange testing (such as labs, EKG, etc.), we will make arrangements to do so.     Although advances in technology are sophisticated, we cannot ensure that it will always work on either your end or our end.  If the connection with a video visit is poor, the visit may have to be switched to a telephone visit.  With either a video or telephone visit, we are not always able to ensure that we have a secure connection.     I need to obtain your verbal consent now.   Are you willing to proceed with your visit today?    Brenda Foster has provided verbal consent on 02/25/2021 for a virtual visit (video or telephone).   Brenda Foster, [place]   Date: 02/25/2021 12:41 PM   Virtual Visit via Video Note   I, Brenda Foster, connected with  Brenda Foster  ([id number], 06/01/1966) on 02/25/21 at 12:30 PM EDT by a video-enabled telemedicine application and verified that I am speaking with the correct person using two identifiers.  Location: Patient: Virtual Visit Location Patient: Home Provider: Virtual Visit Location Provider: Home Office   I discussed the limitations of evaluation and  management by telemedicine and the availability of in person appointments. The patient expressed understanding and agreed to proceed.    History of Present Illness: Brenda Foster is a 55 y.o. who identifies as a female who was assigned female at birth, and is being seen today for COVID-19. Endorses symptoms onset this past Saturday evening with scratchy throat. Then woke up with fever, chills. As of yesterday also noting increased sore throat, fatigue, headache and head congestion. Notes last night she started having more chest congestion and cough (dry). Denies chest pain or SOB. Notes testing positive on a home test Sunday.   HPI: HPI  Problems:  Patient Active Problem List   Diagnosis Date Noted   FH: heart disease 12/20/2020   Grief 12/04/2019   Brain fog 10/06/2019   Exercise intolerance 10/06/2019   Night sweats 12/15/2017   Lumbar pain 12/15/2017   Bilateral hip pain 12/15/2017   Other fatigue 12/15/2017   Polyarthralgia 12/15/2017   Tremor 08/27/2017   Situational anxiety 10/26/2014   Stress incontinence in female 10/12/2013   History of depression 12/22/2012    Allergies:  Allergies  Allergen Reactions   Heparin     Do not give per MD   Sulfa Antibiotics Hives   Medications:  Current Outpatient Medications:    benzonatate (TESSALON) 100 MG capsule, Take 1 capsule (100 mg total) by mouth 3 (three) times daily as needed  for cough., Disp: 30 capsule, Rfl: 0   molnupiravir EUA 200 mg CAPS, Take 4 capsules (800 mg total) by mouth 2 (two) times daily for 5 days., Disp: 40 capsule, Rfl: 0   atorvastatin (LIPITOR) 10 MG tablet, Take 1 tablet (10 mg total) by mouth every evening., Disp: 90 tablet, Rfl: 3   diclofenac (VOLTAREN) 75 MG EC tablet, Take 1 tablet (75 mg total) by mouth 2 (two) times daily., Disp: 180 tablet, Rfl: 1   FLUoxetine (PROZAC) 20 MG tablet, Take 2 tablets (40 mg total) by mouth daily., Disp: 180 tablet, Rfl: 1   LORazepam (ATIVAN) 0.5 MG tablet, Take 1  tablet (0.5 mg total) by mouth 2 (two) times daily as needed for anxiety., Disp: 90 tablet, Rfl: 5   Multiple Vitamin (MULTIVITAMIN) tablet, Take 1 tablet by mouth daily., Disp: , Rfl:    propranolol (INDERAL) 10 MG tablet, Take 1 tablet (10 mg total) by mouth 2 (two) times daily. Needs appointment for additional refills., Disp: 180 tablet, Rfl: 1  Current Facility-Administered Medications:    0.9 %  sodium chloride infusion, 500 mL, Intravenous, Once, Nandigam, Kavitha V, MD  Observations/Objective: Patient is well-developed, well-nourished in no acute distress.  Resting comfortably at home.  Head is normocephalic, atraumatic.  No labored breathing. Speech is clear and coherent with logical content.  Patient is alert and oriented at baseline.   Assessment and Plan: 1. COVID-19 - MyChart COVID-19 home monitoring program; Future - benzonatate (TESSALON) 100 MG capsule; Take 1 capsule (100 mg total) by mouth 3 (three) times daily as needed for cough.  Dispense: 30 capsule; Refill: 0 - molnupiravir EUA 200 mg CAPS; Take 4 capsules (800 mg total) by mouth 2 (two) times daily for 5 days.  Dispense: 40 capsule; Refill: 0 Discussed risks/benefits of antiviral medications including most common potential ADRs. Patient voiced understanding and would like to proceed with antiviral medication. They are candidate for molnupiravir. Rx sent to pharmacy. Supportive measures, OTC medications and vitamin regimen reviewed. Tessalon per order. Patient has been enrolled in a MyChart COVID symptom monitoring program. Samule Dry reviewed in detail. Strict ER precautions discussed with patient.    Follow Up Instructions: I discussed the assessment and treatment plan with the patient. The patient was provided an opportunity to ask questions and all were answered. The patient agreed with the plan and demonstrated an understanding of the instructions.  A copy of instructions were sent to the patient via MyChart.  The  patient was advised to call back or seek an in-person evaluation if the symptoms worsen or if the condition fails to improve as anticipated.  Time:  I spent 15 minutes with the patient via telehealth technology discussing the above problems/concerns.    Brenda Rio, PA-C

## 2021-02-25 NOTE — Patient Instructions (Signed)
Marry Guan, thank you for joining Leeanne Rio, PA-C for today's virtual visit.  While this provider is not your primary care provider (PCP), if your PCP is located in our provider database this encounter information will be shared with them immediately following your visit.  Consent: (Patient) Brenda Foster provided verbal consent for this virtual visit at the beginning of the encounter.  Current Medications:  Current Outpatient Medications:    atorvastatin (LIPITOR) 10 MG tablet, Take 1 tablet (10 mg total) by mouth every evening., Disp: 90 tablet, Rfl: 3   diclofenac (VOLTAREN) 75 MG EC tablet, Take 1 tablet (75 mg total) by mouth 2 (two) times daily., Disp: 180 tablet, Rfl: 1   FLUoxetine (PROZAC) 20 MG tablet, Take 2 tablets (40 mg total) by mouth daily., Disp: 180 tablet, Rfl: 1   LORazepam (ATIVAN) 0.5 MG tablet, Take 1 tablet (0.5 mg total) by mouth 2 (two) times daily as needed for anxiety., Disp: 90 tablet, Rfl: 5   Multiple Vitamin (MULTIVITAMIN) tablet, Take 1 tablet by mouth daily., Disp: , Rfl:    propranolol (INDERAL) 10 MG tablet, Take 1 tablet (10 mg total) by mouth 2 (two) times daily. Needs appointment for additional refills., Disp: 180 tablet, Rfl: 1  Current Facility-Administered Medications:    0.9 %  sodium chloride infusion, 500 mL, Intravenous, Once, Nandigam, Kavitha V, MD   Medications ordered in this encounter:  No orders of the defined types were placed in this encounter.    *If you need refills on other medications prior to your next appointment, please contact your pharmacy*  Follow-Up: Call back or seek an in-person evaluation if the symptoms worsen or if the condition fails to improve as anticipated.  Other Instructions Please keep well-hydrated and get plenty of rest. Start a saline nasal rinse to flush out your nasal passages. You can use plain Mucinex to help thin congestion. If you have a humidifier, running in the bedroom at  night. I want you to start OTC vitamin D3 1000 units daily, vitamin C 1000 mg daily, and a zinc supplement. Please take prescribed medications as directed.  You have been enrolled in a MyChart symptom monitoring program. Please answer these questions daily so we can keep track of how you are doing.  You were to quarantine for 5 days from onset of your symptoms.  After day 5, if you have had no fever and you are feeling better, you can end quarantine but need to mask for an additional 5 days. After day 5 if you have a fever or are having significant symptoms, please quarantine for full 10 days.  If you note any worsening of symptoms, any significant shortness of breath or any chest pain, please seek ER evaluation ASAP.  Please do not delay care!    If you have been instructed to have an in-person evaluation today at a local Urgent Care facility, please use the link below. It will take you to a list of all of our available [facility], including address, phone number and hours of operation. Please do not delay care.  [facility]  If you or a family member do not have a primary care provider, use the link below to schedule a visit and establish care. When you choose a [facility] primary care physician or advanced practice provider, you gain a long-term partner in health. Find a Primary Care Provider  Learn more about [facility]'s in-office and virtual care options: [facility] -  Get Care Now

## 2021-04-01 ENCOUNTER — Other Ambulatory Visit: Payer: Self-pay | Admitting: Family Medicine

## 2021-04-15 ENCOUNTER — Ambulatory Visit (INDEPENDENT_AMBULATORY_CARE_PROVIDER_SITE_OTHER): Payer: BC Managed Care – PPO | Admitting: Family Medicine

## 2021-04-15 ENCOUNTER — Other Ambulatory Visit: Payer: Self-pay | Admitting: Family Medicine

## 2021-04-15 ENCOUNTER — Encounter: Payer: Self-pay | Admitting: Family Medicine

## 2021-04-15 ENCOUNTER — Other Ambulatory Visit: Payer: Self-pay

## 2021-04-15 VITALS — BP 102/67 | HR 57 | Temp 98.1°F | Ht 66.0 in | Wt 159.0 lb

## 2021-04-15 DIAGNOSIS — Z23 Encounter for immunization: Secondary | ICD-10-CM

## 2021-04-15 DIAGNOSIS — Z8249 Family history of ischemic heart disease and other diseases of the circulatory system: Secondary | ICD-10-CM

## 2021-04-15 DIAGNOSIS — E785 Hyperlipidemia, unspecified: Secondary | ICD-10-CM

## 2021-04-15 DIAGNOSIS — M2550 Pain in unspecified joint: Secondary | ICD-10-CM | POA: Diagnosis not present

## 2021-04-15 LAB — COMPREHENSIVE METABOLIC PANEL
ALT: 24 U/L (ref 0–35)
AST: 20 U/L (ref 0–37)
Albumin: 4.4 g/dL (ref 3.5–5.2)
Alkaline Phosphatase: 113 U/L (ref 39–117)
BUN: 15 mg/dL (ref 6–23)
CO2: 29 mEq/L (ref 19–32)
Calcium: 9.4 mg/dL (ref 8.4–10.5)
Chloride: 103 mEq/L (ref 96–112)
Creatinine, Ser: 0.87 mg/dL (ref 0.40–1.20)
GFR: 74.55 mL/min (ref >60.00)
Glucose, Bld: 93 mg/dL (ref 70–99)
Potassium: 4.2 mEq/L (ref 3.5–5.1)
Sodium: 139 mEq/L (ref 135–145)
Total Bilirubin: 0.6 mg/dL (ref 0.2–1.2)
Total Protein: 7 g/dL (ref 6.0–8.3)

## 2021-04-15 LAB — LIPID PANEL
Cholesterol: 155 mg/dL (ref 0–200)
HDL: 40.4 mg/dL (ref >39.00)
LDL Cholesterol: 93 mg/dL (ref 0–99)
NonHDL: 114.28
Total CHOL/HDL Ratio: 4
Triglycerides: 106 mg/dL (ref 0.0–149.0)
VLDL: 21.2 mg/dL (ref 0.0–40.0)

## 2021-04-15 MED ORDER — PROPRANOLOL HCL 10 MG PO TABS: 10.0000 mg | ORAL_TABLET | Freq: Two times a day (BID) | ORAL | 1 refills | Status: DC

## 2021-04-15 MED ORDER — DICLOFENAC SODIUM 75 MG PO TBEC: 75.0000 mg | DELAYED_RELEASE_TABLET | Freq: Two times a day (BID) | ORAL | 1 refills | Status: DC

## 2021-04-15 MED ORDER — FLUOXETINE HCL 20 MG PO TABS: 40.0000 mg | ORAL_TABLET | Freq: Every day | ORAL | 1 refills | Status: DC

## 2021-04-15 MED ORDER — ATORVASTATIN CALCIUM 10 MG PO TABS: 10.0000 mg | ORAL_TABLET | Freq: Every evening | ORAL | 3 refills | Status: DC

## 2021-04-15 MED ORDER — LORAZEPAM 0.5 MG PO TABS: 0.5000 mg | ORAL_TABLET | Freq: Three times a day (TID) | ORAL | 5 refills | Status: DC | PRN

## 2021-04-15 NOTE — Progress Notes (Addendum)
This visit occurred during the SARS-CoV-2 public health emergency.  Safety protocols were in place, including screening questions prior to the visit, additional usage of staff PPE, and extensive cleaning of exam room while observing appropriate contact time as indicated for disinfecting solutions.    Brenda Foster , 08-24-1965, 55 y.o., female MRN: [id number] Patient Care Team    Relationship Specialty Notifications Start End  Brenda Hillock, DO PCP - General Family Medicine  05/12/16   Brenda Kid, DO Referring Physician   05/12/16    Comment: psych  Brenda Salina, MD Consulting Physician Obstetrics and Gynecology  05/12/16     Chief Complaint  Patient presents with   Hyperlipidemia    F/u; pt is fasting     Subjective: Pt presents for an OV  Hyperlipidemia/fh heart disease: Pt made dietary changes- avoided sugar and fried foods.  Exericse: walking but not routine exercise.   Tolerating lipitor qhs.   Depression screen [facility] 2/9 04/15/2021 12/19/2020 06/17/2020 10/06/2019 03/01/2019  Decreased Interest 0 1 0 0 0  Down, Depressed, Hopeless 0 1 1 0 0  PHQ - 2 Score 0 2 1 0 0  Altered sleeping - 0 1 0 0  Tired, decreased energy - 1 1 0 0  Change in appetite - 2 2 0 0  Feeling bad or failure about yourself  - 1 1 0 0  Trouble concentrating - 0 1 3 0  Moving slowly or fidgety/restless - 0 0 0 0  Suicidal thoughts - - 0 0 0  PHQ-9 Score - 6 7 3  0  Difficult doing work/chores - - - Not difficult at all Not difficult at all    Allergies  Allergen Reactions   Heparin     Do not give per MD   Sulfa Antibiotics Hives   Social History   Social History Narrative   Lives with husband and children. 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   Past Medical History:  Diagnosis Date   Anxiety    Depression    Mild n/ infertility   History of miscarriage    Infertility    Interstitial cystitis    Low back pain    Migraines    Sepsis ([place]) 12/2016   in hospital 1 week    Urinary tract bacterial infections    2x   Urine incontinence    Mild   Past Surgical History:  Procedure Laterality Date   [place]   Reduction   CERVICAL CONE BIOPSY  1990   CESAREAN SECTION  2003   1 time   COLONOSCOPY  09/30/2017   polyps   DILATION AND CURETTAGE OF UTERUS  1999-2002   HYSTEROSCOPY  2001   LAPAROSCOPY  2001   LASIK     TUBAL LIGATION  2003   WISDOM TOOTH EXTRACTION     Family History  Problem Relation Age of Onset   Cancer Father        prostate   Heart disease Father 23       MI, premature   Stroke Father        Mind   Prostate cancer Father    Stroke Maternal Grandfather    Anemia Mother    Autism spectrum disorder Daughter    Stroke Maternal Grandmother    Colon cancer Neg Hx    Esophageal cancer Neg Hx    Liver cancer Neg Hx    Pancreatic cancer Neg Hx  Rectal cancer Neg Hx    Stomach cancer Neg Hx    Allergies as of 04/15/2021       Reactions   Heparin    Do not give per MD   Sulfa Antibiotics Hives        Medication List        Accurate as of April 15, 2021  3:28 PM. If you have any questions, ask your nurse or doctor.          STOP taking these medications    benzonatate 100 MG capsule Commonly known as: TESSALON Stopped by: Howard Pouch, DO       TAKE these medications    atorvastatin 10 MG tablet Commonly known as: LIPITOR Take 1 tablet (10 mg total) by mouth every evening.   diclofenac 75 MG EC tablet Commonly known as: VOLTAREN Take 1 tablet (75 mg total) by mouth 2 (two) times daily.   FLUoxetine 20 MG tablet Commonly known as: PROZAC Take 2 tablets (40 mg total) by mouth daily.   LORazepam 0.5 MG tablet Commonly known as: ATIVAN Take 1 tablet (0.5 mg total) by mouth every 8 (eight) hours as needed for anxiety. What changed: when to take this Changed by: Howard Pouch, DO   multivitamin tablet Take 1 tablet by mouth daily.   propranolol 10 MG  tablet Commonly known as: INDERAL Take 1 tablet (10 mg total) by mouth 2 (two) times daily. Needs appointment for additional refills.        All past medical history, surgical history, allergies, family history, immunizations andmedications were updated in the EMR today and reviewed under the history and medication portions of their EMR.     ROS: Negative, with the exception of above mentioned in HPI   Objective:  BP 102/67   Pulse (!) 57   Temp 98.1 F (36.7 C) (Oral)   Ht 5\' 6"  (1.676 m)   Wt 159 lb (72.1 kg)   LMP 01/20/2016 (Approximate)   SpO2 99%   BMI 25.66 kg/m  Body mass index is 25.66 kg/m. Gen: Afebrile. No acute distress. Nontoxic in appearance, well developed, well nourished. Very pleasant female.  HENT: AT. .  Eyes:Pupils Equal Round Reactive to light, Extraocular movements intact,  Conjunctiva without redness, discharge or icterus. Neck/lymp/endocrine: Supple,no lymphadenopathy CV: RRR no murmur, no edema. Chest: CTAB, no wheeze or crackles. Good air movement, normal resp effort.  Skin: no rashes, purpura or petechiae.  Neuro: Normal gait. PERLA. EOMi. Alert. Oriented x3  Psych: Normal affect, dress and demeanor. Normal speech. Normal thought content and judgment.  No results found. No results found. No results found for this or any previous visit (from the past 24 hour(s)).  Assessment/Plan: Brenda Foster is a 55 y.o. female present for OV for  FH: heart disease/Hyperlipidemia LDL goal <100 Tolerating lipitor.  Discussed goals today including CV protection of statin group.  If able will lower lipitor to 5 mg qhs if appropriate after results per her desire. She is willing to stay on 10 mg also.   influenza vaccine needed   Reviewed expectations re: course of current medical issues. Discussed self-management of symptoms. Outlined signs and symptoms indicating need for more acute intervention. Patient verbalized understanding and all questions  were answered. Patient received an After-Visit Summary.    Orders Placed This Encounter  Procedures   Flu Vaccine QUAD 6+ mos PF IM (Fluarix Quad PF)   Comprehensive metabolic panel   Lipid panel   Meds ordered this encounter  Medications   FLUoxetine (PROZAC) 20 MG tablet    Sig: Take 2 tablets (40 mg total) by mouth daily.    Dispense:  180 tablet    Refill:  1   propranolol (INDERAL) 10 MG tablet    Sig: Take 1 tablet (10 mg total) by mouth 2 (two) times daily. Needs appointment for additional refills.    Dispense:  180 tablet    Refill:  1   diclofenac (VOLTAREN) 75 MG EC tablet    Sig: Take 1 tablet (75 mg total) by mouth 2 (two) times daily.    Dispense:  180 tablet    Refill:  1   LORazepam (ATIVAN) 0.5 MG tablet    Sig: Take 1 tablet (0.5 mg total) by mouth every 8 (eight) hours as needed for anxiety.    Dispense:  90 tablet    Refill:  5    Referral Orders  No referral(s) requested today     Note is dictated utilizing voice recognition software. Although note has been proof read prior to signing, occasional typographical errors still can be missed. If any questions arise, please do not hesitate to call for verification.   electronically signed by:  Howard Pouch, DO  [place]

## 2021-04-15 NOTE — Addendum Note (Signed)
Addended by: Howard Pouch A on: 04/15/2021 03:28 PM   Modules accepted: Orders

## 2021-04-15 NOTE — Patient Instructions (Addendum)
Great to see you today.  I have refilled the medication(s) we provide.   If labs were collected, we will inform you of lab results once received either by echart message or telephone call.   - echart message- for normal results that have been seen by the patient already.   - telephone call: abnormal results or if patient has not viewed results in their echart.  

## 2021-09-30 ENCOUNTER — Encounter: Payer: Self-pay | Admitting: Family Medicine

## 2021-09-30 ENCOUNTER — Ambulatory Visit (INDEPENDENT_AMBULATORY_CARE_PROVIDER_SITE_OTHER): Payer: BC Managed Care – PPO | Admitting: Family Medicine

## 2021-09-30 VITALS — BP 113/77 | HR 57 | Temp 98.4°F | Ht 66.0 in | Wt 162.0 lb

## 2021-09-30 DIAGNOSIS — M25551 Pain in right hip: Secondary | ICD-10-CM

## 2021-09-30 DIAGNOSIS — M2550 Pain in unspecified joint: Secondary | ICD-10-CM | POA: Diagnosis not present

## 2021-09-30 DIAGNOSIS — R413 Other amnesia: Secondary | ICD-10-CM | POA: Diagnosis not present

## 2021-09-30 DIAGNOSIS — R61 Generalized hyperhidrosis: Secondary | ICD-10-CM

## 2021-09-30 DIAGNOSIS — F418 Other specified anxiety disorders: Secondary | ICD-10-CM

## 2021-09-30 DIAGNOSIS — F4321 Adjustment disorder with depressed mood: Secondary | ICD-10-CM

## 2021-09-30 DIAGNOSIS — R251 Tremor, unspecified: Secondary | ICD-10-CM

## 2021-09-30 DIAGNOSIS — M545 Low back pain, unspecified: Secondary | ICD-10-CM

## 2021-09-30 DIAGNOSIS — M25552 Pain in left hip: Secondary | ICD-10-CM

## 2021-09-30 LAB — VITAMIN B12: Vitamin B-12: 1463 pg/mL — ABNORMAL HIGH (ref 211–911)

## 2021-09-30 LAB — TSH: TSH: 0.71 u[IU]/mL (ref 0.35–5.50)

## 2021-09-30 LAB — VITAMIN D 25 HYDROXY (VIT D DEFICIENCY, FRACTURES): VITD: 28.42 ng/mL — ABNORMAL LOW (ref 30.00–100.00)

## 2021-09-30 MED ORDER — ATORVASTATIN CALCIUM 10 MG PO TABS: 10.0000 mg | ORAL_TABLET | Freq: Every evening | ORAL | 3 refills | Status: DC

## 2021-09-30 MED ORDER — DICLOFENAC SODIUM 75 MG PO TBEC: 75.0000 mg | DELAYED_RELEASE_TABLET | Freq: Two times a day (BID) | ORAL | 1 refills | Status: DC

## 2021-09-30 MED ORDER — FLUOXETINE HCL 20 MG PO TABS: 40.0000 mg | ORAL_TABLET | Freq: Every day | ORAL | 1 refills | Status: DC

## 2021-09-30 MED ORDER — LORAZEPAM 0.5 MG PO TABS: 0.5000 mg | ORAL_TABLET | Freq: Three times a day (TID) | ORAL | 5 refills | Status: DC | PRN

## 2021-09-30 MED ORDER — PROPRANOLOL HCL 10 MG PO TABS: 10.0000 mg | ORAL_TABLET | Freq: Two times a day (BID) | ORAL | 1 refills | Status: DC

## 2021-09-30 NOTE — Patient Instructions (Addendum)

## 2021-09-30 NOTE — Progress Notes (Signed)
? ? ? ?This visit occurred during the SARS-CoV-2 public health emergency.  Safety protocols were in place, including screening questions prior to the visit, additional usage of staff PPE, and extensive cleaning of exam room while observing appropriate contact time as indicated for disinfecting solutions.  ? ? ?Brenda Foster , 12-18-1965, 56 y.o., female ?MRN: [id number] ?Patient Care Team  ?  Relationship Specialty Notifications Start End  ?Ma Hillock, DO PCP - General Family Medicine  05/12/16   ?Dineen Kid, DO Referring Physician   05/12/16   ? Comment: psych  ?Servando Salina, MD Consulting Physician Obstetrics and Gynecology  05/12/16   ? ? ?Chief Complaint  ?Patient presents with  ? Anxiety  ?  [facility]mc; pt is fasting  ? ?  ?Subjective: Pt presents for an OV  ?Hyperlipidemia/fh heart disease: ?Pt made dietary changes- avoided sugar and fried foods. Tolerating lipitor.  ?Exericse: walking but not routine exercise.  ? ?Anxiety/tremor/grief:.  ?Patient reports she feels her medications are working well for her for on Prozac 40 mg daily and the propranolol 10 mg twice daily. She will occasionally take an ativan when feeling more depressed or anxious.  She reports she still having stress surrounding the separation from her husband, but overall feels she is dealing better with it. ?Prior note: ?She reports occasional continue grief and tearfulness when thinkingof her father that passed earlier in the year. She is also going through a separation with her husband and moved out in March.  ?Original note: ?Pt presents for an OV with complaints of anxiety and tremor from prozac of 1.5 year duration.  Condition is well controlled on prozac 30 mg and Propanolol 10 mg BID. Her prior psychiatric provider has moved and she would like to come here for her care surrounding this issue.  ?  ?Pain: ?She reports c compliance with diclofenac. She states this helps keep her active. ? ? ?  09/30/2021  ? 10:14 AM 04/15/2021  ?   9:08 AM 12/19/2020  ?  9:54 AM 06/17/2020  ? 12:56 PM 10/06/2019  ?  8:31 AM  ?Depression screen PHQ 2/9  ?Decreased Interest 0 0 1 0 0  ?Down, Depressed, Hopeless 1 0 1 1 0  ?PHQ - 2 Score 1 0 2 1 0  ?Altered sleeping 0  0 1 0  ?Tired, decreased energy 0  1 1 0  ?Change in appetite '1  2 2 '$ 0  ?Feeling bad or failure about yourself  '1  1 1 '$ 0  ?Trouble concentrating 0  0 1 3  ?Moving slowly or fidgety/restless 0  0 0 0  ?Suicidal thoughts 0   0 0  ?PHQ-9 Score '3  6 7 3  '$ ?Difficult doing work/chores     Not difficult at all  ? ? ?Allergies  ?Allergen Reactions  ? Heparin   ?  Do not give per MD  ? Sulfa Antibiotics Hives  ? ?Social History  ? ?Social History Narrative  ? Lives with husband and children. 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  ? ?Past Medical History:  ?Diagnosis Date  ? Anxiety   ? Depression   ? Mild n/ infertility  ? History of miscarriage   ? Infertility   ? Interstitial cystitis   ? Low back pain   ? Migraines   ? Sepsis ([place]) 12/2016  ? in hospital 1 week  ? Urinary tract bacterial infections   ? 2x  ? Urine incontinence   ? Mild  ? ?Past Surgical  History:  ?Procedure Laterality Date  ? APPENDECTOMY  1980  ? BREAST SURGERY  1986  ? Reduction  ? CERVICAL CONE BIOPSY  1990  ? CESAREAN SECTION  2003  ? 1 time  ? COLONOSCOPY  09/30/2017  ? polyps  ? DILATION AND CURETTAGE OF UTERUS  1999-2002  ? HYSTEROSCOPY  2001  ? LAPAROSCOPY  2001  ? LASIK    ? TUBAL LIGATION  2003  ? WISDOM TOOTH EXTRACTION    ? ?Family History  ?Problem Relation Age of Onset  ? Cancer Father   ?     prostate  ? Heart disease Father 54  ?     MI, premature  ? Stroke Father   ?     Mind  ? Prostate cancer Father   ? Stroke Maternal Grandfather   ? Anemia Mother   ? Autism spectrum disorder Daughter   ? Stroke Maternal Grandmother   ? Colon cancer Neg Hx   ? Esophageal cancer Neg Hx   ? Liver cancer Neg Hx   ? Pancreatic cancer Neg Hx   ? Rectal cancer Neg Hx   ? Stomach cancer Neg Hx   ? ?Allergies as of 09/30/2021   ? ?   Reactions  ?  Heparin   ? Do not give per MD  ? Sulfa Antibiotics Hives  ? ?  ? ?  ?Medication List  ?  ? ?  ? Accurate as of September 30, 2021 12:57 PM. If you have any questions, ask your nurse or doctor.  ?  ?  ? ?  ? ?atorvastatin 10 MG tablet ?Commonly known as: LIPITOR ?Take 1 tablet (10 mg total) by mouth every evening. ?  ?diclofenac 75 MG EC tablet ?Commonly known as: VOLTAREN ?Take 1 tablet (75 mg total) by mouth 2 (two) times daily. ?  ?FLUoxetine 20 MG tablet ?Commonly known as: PROZAC ?Take 2 tablets (40 mg total) by mouth daily. ?  ?LORazepam 0.5 MG tablet ?Commonly known as: ATIVAN ?Take 1 tablet (0.5 mg total) by mouth every 8 (eight) hours as needed for anxiety. ?  ?multivitamin tablet ?Take 1 tablet by mouth daily. ?  ?propranolol 10 MG tablet ?Commonly known as: INDERAL ?Take 1 tablet (10 mg total) by mouth 2 (two) times daily. Needs appointment for additional refills. ?  ? ?  ? ? ?All past medical history, surgical history, allergies, family history, immunizations andmedications were updated in the EMR today and reviewed under the history and medication portions of their EMR.    ? ?ROS: Negative, with the exception of above mentioned in HPI ? ? ?Objective:  ?BP 113/77   Pulse (!) 57   Temp 98.4 ?F (36.9 ?C) (Oral)   Ht '5\' 6"'$  (1.676 m)   Wt 162 lb (73.5 kg)   LMP 01/20/2016 (Approximate)   SpO2 98%   BMI 26.15 kg/m?  ?Body mass index is 26.15 kg/m?Marland Kitchen ?Physical Exam ?Vitals and nursing note reviewed.  ?Constitutional:   ?   General: She is not in acute distress. ?   Appearance: Normal appearance. She is not ill-appearing, toxic-appearing or diaphoretic.  ?HENT:  ?   Head: Normocephalic and atraumatic.  ?   Mouth/Throat:  ?   Mouth: Mucous membranes are moist.  ?Eyes:  ?   General: No scleral icterus.    ?   Right eye: No discharge.     ?   Left eye: No discharge.  ?   Extraocular Movements: Extraocular movements intact.  ?  Conjunctiva/sclera: Conjunctivae normal.  ?   Pupils: Pupils are equal, round, and  reactive to light.  ?Cardiovascular:  ?   Rate and Rhythm: Normal rate and regular rhythm.  ?Pulmonary:  ?   Effort: Pulmonary effort is normal. No respiratory distress.  ?   Breath sounds: Normal breath sounds. No wheezing, rhonchi or rales.  ?Musculoskeletal:  ?   Cervical back: Neck supple. No tenderness.  ?   Right lower leg: No edema.  ?   Left lower leg: No edema.  ?Lymphadenopathy:  ?   Cervical: No cervical adenopathy.  ?Skin: ?   General: Skin is warm and dry.  ?   Coloration: Skin is not jaundiced or pale.  ?   Findings: No erythema or rash.  ?Neurological:  ?   Mental Status: She is alert and oriented to person, place, and time. Mental status is at baseline.  ?   Motor: No weakness.  ?   Gait: Gait normal.  ?Psychiatric:     ?   Mood and Affect: Mood normal.     ?   Behavior: Behavior normal.     ?   Thought Content: Thought content normal.     ?   Judgment: Judgment normal.  ? ? ? ?No results found. ?No results found. ?No results found for this or any previous visit (from the past 24 hour(s)). ? ?Assessment/Plan: ?Marinna Blane is a 56 y.o. female present for OV for  ?FH: heart disease/Hyperlipidemia LDL goal <100 ?Tolerating lipitor.  ?Discussed goals today including CV protection of statin group.  ?If able will lower lipitor to 5 mg qhs if appropriate after results per her desire. She is willing to stay on 10 mg also.  ?Labs due next visit ? ?Situational anxiety/Tremor/grief ?Stable ?Continue ativan 0.'5mg'$  TID PRN. ?- [employer] reviewed today ?-Continue Prozac  40 mg QD ?- continue propanol 10 mg BID (tremor 2/2 to prozac).  ?- She has started therapy for her grief. ?-Follow-up 5.5 mos for [facility] ?  ?Lumbar pain/Bilateral hip pain/Polyarthralgia ?Stable ?Continue diclofenac ? ?Reviewed expectations re: course of current medical issues. ?Discussed self-management of symptoms. ?Outlined signs and symptoms indicating need for more acute intervention. ?Patient verbalized understanding and all questions  were answered. ?Patient received an After-Visit Summary. ? ? ? ?Orders Placed This Encounter  ?Procedures  ? TSH  ? B12  ? Vitamin D (25 hydroxy)  ? ?Meds ordered this encounter  ?Medications  ? diclofenac (VOLT

## 2021-12-30 ENCOUNTER — Telehealth: Payer: BC Managed Care – PPO | Admitting: Physician Assistant

## 2021-12-30 DIAGNOSIS — R21 Rash and other nonspecific skin eruption: Secondary | ICD-10-CM | POA: Diagnosis not present

## 2021-12-30 MED ORDER — TRIAMCINOLONE ACETONIDE 0.1 % EX CREA: 1.0000 | TOPICAL_CREAM | Freq: Two times a day (BID) | CUTANEOUS | 0 refills | Status: DC

## 2021-12-30 MED ORDER — PREDNISONE 10 MG PO TABS: ORAL_TABLET | ORAL | 0 refills | Status: AC

## 2021-12-30 NOTE — Progress Notes (Signed)
Virtual Visit Consent   Brenda Foster, you are scheduled for a virtual visit with a [facility] provider today. Just as with appointments in the office, your consent must be obtained to participate. Your consent will be active for this visit and any virtual visit you may have with one of our providers in the next 365 days. If you have a MyChart account, a copy of this consent can be sent to you electronically.  As this is a virtual visit, video technology does not allow for your provider to perform a traditional examination. This may limit your provider's ability to fully assess your condition. If your provider identifies any concerns that need to be evaluated in person or the need to arrange testing (such as labs, EKG, etc.), we will make arrangements to do so. Although advances in technology are sophisticated, we cannot ensure that it will always work on either your end or our end. If the connection with a video visit is poor, the visit may have to be switched to a telephone visit. With either a video or telephone visit, we are not always able to ensure that we have a secure connection.  By engaging in this virtual visit, you consent to the provision of healthcare and authorize for your insurance to be billed (if applicable) for the services provided during this visit. Depending on your insurance coverage, you may receive a charge related to this service.  I need to obtain your verbal consent now. Are you willing to proceed with your visit today? Brenda Foster has provided verbal consent on 12/30/2021 for a virtual visit (video or telephone). Brenda Foster, [place]  Date: 12/30/2021 2:39 PM  Virtual Visit via Video Note   I, Brenda Foster, connected with  Brenda Foster  ([id number], 05-30-1966) on 12/30/21 at  2:00 PM EDT by a video-enabled telemedicine application and verified that I am speaking with the correct person using two identifiers.  Location: Patient: Virtual Visit  Location Patient: Home Provider: Virtual Visit Location Provider: Home Office   I discussed the limitations of evaluation and management by telemedicine and the availability of in person appointments. The patient expressed understanding and agreed to proceed.    History of Present Illness: Aneesha Foster is a 56 y.o. who identifies as a female who was assigned female at birth, and is being seen today for pruritic rash of chest starting last week while in [place]. Notes occurred after being in the sun for some time. Denies change to soaps, lotion, detergents or other things applied to the skin. Rash is bumpy. Denies fevers, chills. Notes now spreading to her arms. Has been taking benadryl every few hours for itch.   Feels slightly off today.  Denies fever. Has been hydrating well.  Covering up in sun.  Denies change in soaps, lotions or detergents.   Benadryl   HPI: HPI  Problems:  Patient Active Problem List   Diagnosis Date Noted   Hyperlipidemia LDL goal <100 04/15/2021   FH: heart disease 12/20/2020   Grief 12/04/2019   Brain fog 10/06/2019   Exercise intolerance 10/06/2019   Night sweats 12/15/2017   Lumbar pain 12/15/2017   Bilateral hip pain 12/15/2017   Other fatigue 12/15/2017   Polyarthralgia 12/15/2017   Tremor 08/27/2017   Situational anxiety 10/26/2014   Stress incontinence in female 10/12/2013   History of depression 12/22/2012    Allergies:  Allergies  Allergen Reactions   Heparin     Do not give per MD  Sulfa Antibiotics Hives   Medications:  Current Outpatient Medications:    predniSONE (DELTASONE) 10 MG tablet, Take 4 tablets (40 mg total) by mouth daily with breakfast for 4 days, THEN 3 tablets (30 mg total) daily with breakfast for 4 days, THEN 2 tablets (20 mg total) daily with breakfast for 3 days, THEN 1 tablet (10 mg total) daily with breakfast for 3 days., Disp: 37 tablet, Rfl: 0   triamcinolone cream (KENALOG) 0.1 %, Apply 1 Application  topically 2 (two) times daily., Disp: 30 g, Rfl: 0   atorvastatin (LIPITOR) 10 MG tablet, Take 1 tablet (10 mg total) by mouth every evening., Disp: 90 tablet, Rfl: 3   diclofenac (VOLTAREN) 75 MG EC tablet, Take 1 tablet (75 mg total) by mouth 2 (two) times daily., Disp: 180 tablet, Rfl: 1   FLUoxetine (PROZAC) 20 MG tablet, Take 2 tablets (40 mg total) by mouth daily., Disp: 180 tablet, Rfl: 1   LORazepam (ATIVAN) 0.5 MG tablet, Take 1 tablet (0.5 mg total) by mouth every 8 (eight) hours as needed for anxiety., Disp: 90 tablet, Rfl: 5   Multiple Vitamin (MULTIVITAMIN) tablet, Take 1 tablet by mouth daily., Disp: , Rfl:    propranolol (INDERAL) 10 MG tablet, Take 1 tablet (10 mg total) by mouth 2 (two) times daily. Needs appointment for additional refills., Disp: 180 tablet, Rfl: 1  Observations/Objective: Patient is well-developed, well-nourished in no acute distress.  Resting comfortably at home.  Head is normocephalic, atraumatic.  No labored breathing. Speech is clear and coherent with logical content.  Patient is alert and oriented at baseline.  Erythematous maculopapular rash of chest and extensor surface of forearms, sll on sun-exposed areas.   Assessment and Plan: 1. Rash and nonspecific skin eruption - triamcinolone cream (KENALOG) 0.1 %; Apply 1 Application topically 2 (two) times daily.  Dispense: 30 g; Refill: 0 - predniSONE (DELTASONE) 10 MG tablet; Take 4 tablets (40 mg total) by mouth daily with breakfast for 4 days, THEN 3 tablets (30 mg total) daily with breakfast for 4 days, THEN 2 tablets (20 mg total) daily with breakfast for 3 days, THEN 1 tablet (10 mg total) daily with breakfast for 3 days.  Dispense: 37 tablet; Refill: 0  Photodermatitis versus allergic dermatitis. Start prednisone taper and triamcinolone cream. Supportive measures and OTC medications reviewed.   Follow Up Instructions: I discussed the assessment and treatment plan with the patient. The patient was  provided an opportunity to ask questions and all were answered. The patient agreed with the plan and demonstrated an understanding of the instructions.  A copy of instructions were sent to the patient via MyChart unless otherwise noted below.   The patient was advised to call back or seek an in-person evaluation if the symptoms worsen or if the condition fails to improve as anticipated.  Time:  I spent 10 minutes with the patient via telehealth technology discussing the above problems/concerns.    Brenda Rio, PA-C

## 2021-12-30 NOTE — Patient Instructions (Signed)
  Brenda Foster, thank you for joining Leeanne Rio, PA-C for today's virtual visit.  While this provider is not your primary care provider (PCP), if your PCP is located in our provider database this encounter information will be shared with them immediately following your visit.  Consent: (Patient) Brenda Foster provided verbal consent for this virtual visit at the beginning of the encounter.  Current Medications:  Current Outpatient Medications:    atorvastatin (LIPITOR) 10 MG tablet, Take 1 tablet (10 mg total) by mouth every evening., Disp: 90 tablet, Rfl: 3   diclofenac (VOLTAREN) 75 MG EC tablet, Take 1 tablet (75 mg total) by mouth 2 (two) times daily., Disp: 180 tablet, Rfl: 1   FLUoxetine (PROZAC) 20 MG tablet, Take 2 tablets (40 mg total) by mouth daily., Disp: 180 tablet, Rfl: 1   LORazepam (ATIVAN) 0.5 MG tablet, Take 1 tablet (0.5 mg total) by mouth every 8 (eight) hours as needed for anxiety., Disp: 90 tablet, Rfl: 5   Multiple Vitamin (MULTIVITAMIN) tablet, Take 1 tablet by mouth daily., Disp: , Rfl:    propranolol (INDERAL) 10 MG tablet, Take 1 tablet (10 mg total) by mouth 2 (two) times daily. Needs appointment for additional refills., Disp: 180 tablet, Rfl: 1  Current Facility-Administered Medications:    0.9 %  sodium chloride infusion, 500 mL, Intravenous, Once, Nandigam, Kavitha V, MD   Medications ordered in this encounter:  No orders of the defined types were placed in this encounter.    *If you need refills on other medications prior to your next appointment, please contact your pharmacy*  Follow-Up: Call back or seek an in-person evaluation if the symptoms worsen or if the condition fails to improve as anticipated.  Other Instructions Take steroid taper as directed. You can apply the cream as directed as well. Cold compresses can be beneficial. Avoid heat when possible.Keep out of direct sunlight as much as possible. If not resolving or any  new/worsening symptoms, please follow-up with your PCP.    If you have been instructed to have an in-person evaluation today at a local Urgent Care facility, please use the link below. It will take you to a list of all of our available [facility], including address, phone number and hours of operation. Please do not delay care.  [facility]  If you or a family member do not have a primary care provider, use the link below to schedule a visit and establish care. When you choose a [facility] primary care physician or advanced practice provider, you gain a long-term partner in health. Find a Primary Care Provider  Learn more about [facility] in-office and virtual care options: [facility]

## 2022-04-23 ENCOUNTER — Other Ambulatory Visit: Payer: Self-pay | Admitting: Family Medicine

## 2022-05-02 ENCOUNTER — Other Ambulatory Visit: Payer: Self-pay | Admitting: Family Medicine

## 2022-05-07 ENCOUNTER — Other Ambulatory Visit: Payer: Self-pay | Admitting: Family Medicine

## 2022-05-07 DIAGNOSIS — Z1231 Encounter for screening mammogram for malignant neoplasm of breast: Secondary | ICD-10-CM

## 2022-05-19 ENCOUNTER — Other Ambulatory Visit: Payer: Self-pay | Admitting: Family Medicine

## 2022-06-05 ENCOUNTER — Encounter: Payer: Self-pay | Admitting: Family Medicine

## 2022-06-05 ENCOUNTER — Ambulatory Visit (INDEPENDENT_AMBULATORY_CARE_PROVIDER_SITE_OTHER): Payer: BC Managed Care – PPO | Admitting: Family Medicine

## 2022-06-05 ENCOUNTER — Other Ambulatory Visit: Payer: Self-pay | Admitting: Family Medicine

## 2022-06-05 VITALS — BP 116/72 | HR 58 | Temp 98.2°F | Ht 66.0 in | Wt 172.0 lb

## 2022-06-05 DIAGNOSIS — E785 Hyperlipidemia, unspecified: Secondary | ICD-10-CM

## 2022-06-05 DIAGNOSIS — F418 Other specified anxiety disorders: Secondary | ICD-10-CM | POA: Diagnosis not present

## 2022-06-05 DIAGNOSIS — Z0000 Encounter for general adult medical examination without abnormal findings: Secondary | ICD-10-CM

## 2022-06-05 DIAGNOSIS — Z23 Encounter for immunization: Secondary | ICD-10-CM | POA: Diagnosis not present

## 2022-06-05 DIAGNOSIS — Z131 Encounter for screening for diabetes mellitus: Secondary | ICD-10-CM

## 2022-06-05 LAB — LIPID PANEL
Cholesterol: 143 mg/dL (ref 0–200)
HDL: 43.6 mg/dL (ref >39.00)
LDL Cholesterol: 79 mg/dL (ref 0–99)
NonHDL: 99.56
Total CHOL/HDL Ratio: 3
Triglycerides: 105 mg/dL (ref 0.0–149.0)
VLDL: 21 mg/dL (ref 0.0–40.0)

## 2022-06-05 LAB — HEMOGLOBIN A1C: Hgb A1c MFr Bld: 5.3 % (ref 4.6–6.5)

## 2022-06-05 LAB — CBC
HCT: 38.8 % (ref 36.0–46.0)
Hemoglobin: 13.2 g/dL (ref 12.0–15.0)
MCHC: 34 g/dL (ref 30.0–36.0)
MCV: 100.6 fl — ABNORMAL HIGH (ref 78.0–100.0)
Platelets: 198 10*3/uL (ref 150.0–400.0)
RBC: 3.86 Mil/uL — ABNORMAL LOW (ref 3.87–5.11)
RDW: 12.4 % (ref 11.5–15.5)
WBC: 4.8 10*3/uL (ref 4.0–10.5)

## 2022-06-05 LAB — COMPREHENSIVE METABOLIC PANEL
ALT: 29 U/L (ref 0–35)
AST: 22 U/L (ref 0–37)
Albumin: 4.1 g/dL (ref 3.5–5.2)
Alkaline Phosphatase: 121 U/L — ABNORMAL HIGH (ref 39–117)
BUN: 17 mg/dL (ref 6–23)
CO2: 28 mEq/L (ref 19–32)
Calcium: 9.3 mg/dL (ref 8.4–10.5)
Chloride: 107 mEq/L (ref 96–112)
Creatinine, Ser: 0.87 mg/dL (ref 0.40–1.20)
GFR: 73.96 mL/min (ref >60.00)
Glucose, Bld: 100 mg/dL — ABNORMAL HIGH (ref 70–99)
Potassium: 4.4 mEq/L (ref 3.5–5.1)
Sodium: 141 mEq/L (ref 135–145)
Total Bilirubin: 0.4 mg/dL (ref 0.2–1.2)
Total Protein: 6.4 g/dL (ref 6.0–8.3)

## 2022-06-05 LAB — TSH: TSH: 0.72 u[IU]/mL (ref 0.35–5.50)

## 2022-06-05 MED ORDER — PROPRANOLOL HCL 10 MG PO TABS: 10.0000 mg | ORAL_TABLET | Freq: Two times a day (BID) | ORAL | 1 refills | Status: DC

## 2022-06-05 MED ORDER — ATORVASTATIN CALCIUM 10 MG PO TABS: 10.0000 mg | ORAL_TABLET | Freq: Every evening | ORAL | 3 refills | Status: DC

## 2022-06-05 MED ORDER — DICLOFENAC SODIUM 75 MG PO TBEC: 75.0000 mg | DELAYED_RELEASE_TABLET | Freq: Two times a day (BID) | ORAL | 1 refills | Status: DC

## 2022-06-05 MED ORDER — LORAZEPAM 0.5 MG PO TABS: 0.5000 mg | ORAL_TABLET | Freq: Three times a day (TID) | ORAL | 5 refills | Status: DC | PRN

## 2022-06-05 MED ORDER — FLUOXETINE HCL 20 MG PO TABS: 40.0000 mg | ORAL_TABLET | Freq: Every day | ORAL | 1 refills | Status: DC

## 2022-06-05 NOTE — Patient Instructions (Addendum)
Return in about 24 weeks (around 11/20/2022) for Routine chronic condition follow-up.  Start over the counter Prilosec for at least 2 weeks, if it helps, then use for 3  months. After 3 mos, stop.       Great to see you today.  I have refilled the medication(s) we provide.   If labs were collected, we will inform you of lab results once received either by echart message or telephone call.   - echart message- for normal results that have been seen by the patient already.   - telephone call: abnormal results or if patient has not viewed results in their echart.  Health Maintenance, Female Adopting a healthy lifestyle and getting preventive care are important in promoting health and wellness. Ask your health care provider about: The right schedule for you to have regular tests and exams. Things you can do on your own to prevent diseases and keep yourself healthy. What should I know about diet, weight, and exercise? Eat a healthy diet  Eat a diet that includes plenty of vegetables, fruits, low-fat dairy products, and lean protein. Do not eat a lot of foods that are high in solid fats, added sugars, or sodium. Maintain a healthy weight Body mass index (BMI) is used to identify weight problems. It estimates body fat based on height and weight. Your health care provider can help determine your BMI and help you achieve or maintain a healthy weight. Get regular exercise Get regular exercise. This is one of the most important things you can do for your health. Most adults should: Exercise for at least 150 minutes each week. The exercise should increase your heart rate and make you sweat (moderate-intensity exercise). Do strengthening exercises at least twice a week. This is in addition to the moderate-intensity exercise. Spend less time sitting. Even light physical activity can be beneficial. Watch cholesterol and blood lipids Have your blood tested for lipids and cholesterol at 57 years of  age, then have this test every 5 years. Have your cholesterol levels checked more often if: Your lipid or cholesterol levels are high. You are older than 57 years of age. You are at high risk for heart disease. What should I know about cancer screening? Depending on your health history and family history, you may need to have cancer screening at various ages. This may include screening for: Breast cancer. Cervical cancer. Colorectal cancer. Skin cancer. Lung cancer. What should I know about heart disease, diabetes, and high blood pressure? Blood pressure and heart disease High blood pressure causes heart disease and increases the risk of stroke. This is more likely to develop in people who have high blood pressure readings or are overweight. Have your blood pressure checked: Every 3-5 years if you are 17-81 years of age. Every year if you are 68 years old or older. Diabetes Have regular diabetes screenings. This checks your fasting blood sugar level. Have the screening done: Once every three years after age 56 if you are at a normal weight and have a low risk for diabetes. More often and at a younger age if you are overweight or have a high risk for diabetes. What should I know about preventing infection? Hepatitis B If you have a higher risk for hepatitis B, you should be screened for this virus. Talk with your health care provider to find out if you are at risk for hepatitis B infection. Hepatitis C Testing is recommended for: Everyone born from [birth year] through 1966. Anyone with known risk  factors for hepatitis C. Sexually transmitted infections (STIs) Get screened for STIs, including gonorrhea and chlamydia, if: You are sexually active and are younger than 57 years of age. You are older than 57 years of age and your health care provider tells you that you are at risk for this type of infection. Your sexual activity has changed since you were last screened, and you are at increased  risk for chlamydia or gonorrhea. Ask your health care provider if you are at risk. Ask your health care provider about whether you are at high risk for HIV. Your health care provider may recommend a prescription medicine to help prevent HIV infection. If you choose to take medicine to prevent HIV, you should first get tested for HIV. You should then be tested every 3 months for as long as you are taking the medicine. Pregnancy If you are about to stop having your period (premenopausal) and you may become pregnant, seek counseling before you get pregnant. Take 400 to 800 micrograms (mcg) of folic acid every day if you become pregnant. Ask for birth control (contraception) if you want to prevent pregnancy. Osteoporosis and menopause Osteoporosis is a disease in which the bones lose minerals and strength with aging. This can result in bone fractures. If you are 56 years old or older, or if you are at risk for osteoporosis and fractures, ask your health care provider if you should: Be screened for bone loss. Take a calcium or vitamin D supplement to lower your risk of fractures. Be given hormone replacement therapy (HRT) to treat symptoms of menopause. Follow these instructions at home: Alcohol use Do not drink alcohol if: Your health care provider tells you not to drink. You are pregnant, may be pregnant, or are planning to become pregnant. If you drink alcohol: Limit how much you have to: 0-1 drink a day. Know how much alcohol is in your drink. In the [place] one drink equals one 12 oz bottle of beer (355 mL), one 5 oz glass of wine (148 mL), or one 1 oz glass of hard liquor (44 mL). Lifestyle Do not use any products that contain nicotine or tobacco. These products include cigarettes, chewing tobacco, and vaping devices, such as e-cigarettes. If you need help quitting, ask your health care provider. Do not use street drugs. Do not share needles. Ask your health care provider for help if you need  support or information about quitting drugs. General instructions Schedule regular health, dental, and eye exams. Stay current with your vaccines. Tell your health care provider if: You often feel depressed. You have ever been abused or do not feel safe at home. Summary Adopting a healthy lifestyle and getting preventive care are important in promoting health and wellness. Follow your health care provider's instructions about healthy diet, exercising, and getting tested or screened for diseases. Follow your health care provider's instructions on monitoring your cholesterol and blood pressure. This information is not intended to replace advice given to you by your health care provider. Make sure you discuss any questions you have with your health care provider. Document Revised: 10/28/2020 Document Reviewed: 10/28/2020 Elsevier Patient Education  [place].

## 2022-06-05 NOTE — Progress Notes (Signed)
Brenda Foster , February 16, 1966, 56 y.o., female MRN: [id number] Patient Care Team    Relationship Specialty Notifications Start End  Ma Hillock, DO PCP - General Family Medicine  05/12/16   Dineen Kid, DO Referring Physician   05/12/16    Comment: psych  Servando Salina, MD Consulting Physician Obstetrics and Gynecology  05/12/16     Chief Complaint  Patient presents with   Annual Exam    Pt is fasting; [facility]     Subjective:Brenda Foster is a 56 y.o.  Pt presents for an OV for CPE and Chronic Conditions/illness Management Health Maintenance: Colonoscopy: completed 12/2018 Dr. Silverio Decamp- rpt 5 yr.  Mammogram: [facility] Cervical cancer screening: last pap:2019, results:normal per pt, completed by: Dr. Garwin Brothers. - 18yrfollow up Immunizations: tdap UTD 2015, Influenza completed today (encouraged yearly), shingrix  completed  Infectious disease screening: HIV completed 2015; hep c completed DEXA:routine screen  Hyperlipidemia/fh heart disease: Pt made dietary changes- avoided sugar and fried foods.  Compliant with Lipitor Exericse: walking but not routine exercise.   Anxiety/tremor/grief:.  Patient reports she feels her medications are working very well or her for on Prozac 40 mg daily and the propranolol 10 mg twice daily. She will occasionally take an ativan when feeling more depressed or anxious.  She is having a hard time with her divorce process. Prior note: She reports occasional continue grief and tearfulness when thinkingof her father that passed earlier in the year. She is also going through a separation with her husband and moved out in March.  Original note: Pt presents for an OV with complaints of anxiety and tremor from prozac of 1.5 year duration.  Condition is well controlled on prozac 30 mg and Propanolol 10 mg BID. Her prior psychiatric provider has moved and she would like to come here for her care surrounding this issue.     Pain: She reports compliance with diclofenac. She states this helps keep her active and comfortable.     06/05/2022    8:56 AM 09/30/2021   10:14 AM 04/15/2021    9:08 AM 12/19/2020    9:54 AM 06/17/2020   12:56 PM  Depression screen PHQ 2/9  Decreased Interest 0 0 0 1 0  Down, Depressed, Hopeless 2 1 0 1 1  PHQ - 2 Score 2 1 0 2 1  Altered sleeping 0 0  0 1  Tired, decreased energy 1 0  1 1  Change in appetite '3 1  2 2  '$ Feeling bad or failure about yourself  '2 1  1 1  '$ Trouble concentrating 1 0  0 1  Moving slowly or fidgety/restless 0 0  0 0  Suicidal thoughts 0 0   0  PHQ-9 Score '9 3  6 7    '$ Allergies  Allergen Reactions   Heparin     Do not give per MD   Sulfa Antibiotics Hives   Social History   Social History Narrative   Lives with husband and children. 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 Stopped about 2 ya.   History of miscarriage    Infertility    Interstitial cystitis    Low back pain    Migraines    Sepsis [place] 12/2016   in hospital 1 week  Urinary tract bacterial infections    2x   Urine incontinence    Mild   Past Surgical History:  Procedure Laterality Date   [place]   Reduction   CERVICAL CONE BIOPSY  1990   CESAREAN SECTION  2003   1 time   COLONOSCOPY  09/30/2017   polyps   DILATION AND CURETTAGE OF UTERUS  1999-2002   HYSTEROSCOPY  2001   LAPAROSCOPY  2001   LASIK     TUBAL LIGATION  2003   WISDOM TOOTH EXTRACTION     Family History  Problem Relation Age of Onset   Cancer Father        prostate   Heart disease Father 55       MI, premature   Stroke Father        Mind   Prostate cancer Father    Stroke Maternal Grandfather    Anemia Mother    Autism spectrum disorder Daughter    Stroke Maternal Grandmother    Colon cancer Neg  Hx    Esophageal cancer Neg Hx    Liver cancer Neg Hx    Pancreatic cancer Neg Hx    Rectal cancer Neg Hx    Stomach cancer Neg Hx    Allergies as of 06/05/2022       Reactions   Heparin    Do not give per MD   Sulfa Antibiotics Hives        Medication List        Accurate as of June 05, 2022  9:49 AM. If you have any questions, ask your nurse or doctor.          STOP taking these medications    triamcinolone cream 0.1 % Commonly known as: KENALOG Stopped by: Howard Pouch, DO       TAKE these medications    atorvastatin 10 MG tablet Commonly known as: LIPITOR Take 1 tablet (10 mg total) by mouth every evening.   diclofenac 75 MG EC tablet Commonly known as: VOLTAREN Take 1 tablet (75 mg total) by mouth 2 (two) times daily.   FLUoxetine 20 MG tablet Commonly known as: PROZAC Take 2 tablets (40 mg total) by mouth daily.   LORazepam 0.5 MG tablet Commonly known as: ATIVAN Take 1 tablet (0.5 mg total) by mouth every 8 (eight) hours as needed for anxiety.   multivitamin tablet Take 1 tablet by mouth daily.   propranolol 10 MG tablet Commonly known as: INDERAL Take 1 tablet (10 mg total) by mouth 2 (two) times daily. Needs appointment for additional refills. What changed: when to take this        All past medical history, surgical history, allergies, family history, immunizations andmedications were updated in the EMR today and reviewed under the history and medication portions of their EMR.     ROS: Negative, with the exception of above mentioned in HPI   Objective:  BP 116/72   Pulse (!) 58   Temp 98.2 F (36.8 C) (Oral)   Ht '5\' 6"'$  (1.676 m)   Wt 172 lb (78 kg)   LMP 01/20/2016 (Approximate)   SpO2 100%   BMI 27.76 kg/m  Body mass index is 27.76 kg/m. Physical Exam Vitals and nursing note reviewed.  Constitutional:      General: She is not in acute distress.    Appearance: Normal appearance. She is not ill-appearing or  toxic-appearing.  HENT:     Head:  Normocephalic and atraumatic.     Right Ear: Tympanic membrane, ear canal and external ear normal. There is no impacted cerumen.     Left Ear: Tympanic membrane, ear canal and external ear normal. There is no impacted cerumen.     Nose: No congestion or rhinorrhea.     Mouth/Throat:     Mouth: Mucous membranes are moist.     Pharynx: Oropharynx is clear. No oropharyngeal exudate or posterior oropharyngeal erythema.  Eyes:     General: No scleral icterus.       Right eye: No discharge.        Left eye: No discharge.     Extraocular Movements: Extraocular movements intact.     Conjunctiva/sclera: Conjunctivae normal.     Pupils: Pupils are equal, round, and reactive to light.  Cardiovascular:     Rate and Rhythm: Normal rate and regular rhythm.     Pulses: Normal pulses.     Heart sounds: Normal heart sounds. No murmur heard.    No friction rub. No gallop.  Pulmonary:     Effort: Pulmonary effort is normal. No respiratory distress.     Breath sounds: Normal breath sounds. No stridor. No wheezing, rhonchi or rales.  Chest:     Chest wall: No tenderness.  Abdominal:     General: Abdomen is flat. Bowel sounds are normal. There is no distension.     Palpations: Abdomen is soft. There is no mass.     Tenderness: There is no abdominal tenderness. There is no right CVA tenderness, left CVA tenderness, guarding or rebound.     Hernia: No hernia is present.  Genitourinary:    Comments:   Musculoskeletal:        General: No swelling, tenderness or deformity. Normal range of motion.     Cervical back: Normal range of motion and neck supple. No rigidity or tenderness.     Right lower leg: No edema.     Left lower leg: No edema.  Lymphadenopathy:     Cervical: No cervical adenopathy.  Skin:    General: Skin is warm and dry.     Coloration: Skin is not jaundiced or pale.     Findings: No bruising, erythema, lesion or rash.  Neurological:     General: No  focal deficit present.     Mental Status: She is alert and oriented to person, place, and time. Mental status is at baseline.     Cranial Nerves: No cranial nerve deficit.     Sensory: No sensory deficit.     Motor: No weakness.     Coordination: Coordination normal.     Gait: Gait normal.     Deep Tendon Reflexes: Reflexes normal.  Psychiatric:        Mood and Affect: Mood normal.        Behavior: Behavior normal.        Thought Content: Thought content normal.        Judgment: Judgment normal.     No results found. No results found. No results found for this or any previous visit (from the past 24 hour(s)).  Assessment/Plan: Brenda Foster is a 56 y.o. female present for OV for cpe, Chronic Conditions/illness Management FH: heart disease/Hyperlipidemia LDL goal <100 Stable Continue lipitor.  cBC, CMP, TSH and lipids collected today  Situational anxiety/Tremor/grief Stable Continue ativan 0.'5mg'$  TID PRN. - NCCS database reviewed today Continue Prozac  40 mg QD Continue propanol 10 mg BID (tremor 2/2 to prozac).  -  referred to therapist today for her -Follow-up 5.5 mos for [facility]   Lumbar pain/Bilateral hip pain/Polyarthralgia Stable Continue diclofenac  Diabetes mellitus screening - Hemoglobin A1c  Need for immunization against influenza - Flu Vaccine QUAD 6+ mos PF IM (Fluarix Quad PF)  Routine general medical examination at a health care facility Patient was encouraged to exercise greater than 150 minutes a week. Patient was encouraged to choose a diet filled with fresh fruits and vegetables, and lean meats. AVS provided to patient today for education/recommendation on gender specific health and safety maintenance. Colonoscopy: completed 12/2018 Dr. Silverio Decamp- rpt 5 yr.  Mammogram: completed:[facility] Cervical cancer screening: last pap:2019, results:normal per pt, completed by: Dr. Garwin Brothers. - 40yrfollow up Immunizations: tdap UTD 2015, Influenza  completed today (encouraged yearly), shingrix  completed  Infectious disease screening: HIV completed 2015; hep c completed DEXA:routine screen   Reviewed expectations re: course of current medical issues. Discussed self-management of symptoms. Outlined signs and symptoms indicating need for more acute intervention. Patient verbalized understanding and all questions were answered. Patient received an After-Visit Summary.    Orders Placed This Encounter  Procedures   Flu Vaccine QUAD 6+ mos PF IM (Fluarix Quad PF)   CBC   Comprehensive metabolic panel   Hemoglobin A1c   Lipid panel   TSH   Ambulatory referral to Psychology   Meds ordered this encounter  Medications   atorvastatin (LIPITOR) 10 MG tablet    Sig: Take 1 tablet (10 mg total) by mouth every evening.    Dispense:  90 tablet    Refill:  3   diclofenac (VOLTAREN) 75 MG EC tablet    Sig: Take 1 tablet (75 mg total) by mouth 2 (two) times daily.    Dispense:  180 tablet    Refill:  1   FLUoxetine (PROZAC) 20 MG tablet    Sig: Take 2 tablets (40 mg total) by mouth daily.    Dispense:  180 tablet    Refill:  1   propranolol (INDERAL) 10 MG tablet    Sig: Take 1 tablet (10 mg total) by mouth 2 (two) times daily. Needs appointment for additional refills.    Dispense:  180 tablet    Refill:  1   LORazepam (ATIVAN) 0.5 MG tablet    Sig: Take 1 tablet (0.5 mg total) by mouth every 8 (eight) hours as needed for anxiety.    Dispense:  90 tablet    Refill:  5    Referral Orders         Ambulatory referral to Psychology       Note is dictated utilizing voice recognition software. Although note has been proof read prior to signing, occasional typographical errors still can be missed. If any questions arise, please do not hesitate to call for verification.   electronically signed by:  RHoward Pouch DO  [place]

## 2022-06-18 ENCOUNTER — Encounter: Payer: Self-pay | Admitting: Family Medicine

## 2022-07-03 ENCOUNTER — Ambulatory Visit
Admission: RE | Admit: 2022-07-03 | Discharge: 2022-07-03 | Disposition: A | Discharge status: Home / Self Care | Payer: BC Managed Care – PPO | Source: Ambulatory Visit | Attending: Family Medicine | Admitting: Family Medicine

## 2022-07-03 DIAGNOSIS — Z1231 Encounter for screening mammogram for malignant neoplasm of breast: Secondary | ICD-10-CM

## 2022-07-06 ENCOUNTER — Ambulatory Visit (INDEPENDENT_AMBULATORY_CARE_PROVIDER_SITE_OTHER): Payer: BC Managed Care – PPO | Admitting: Behavioral Health

## 2022-07-06 DIAGNOSIS — Z635 Disruption of family by separation and divorce: Secondary | ICD-10-CM | POA: Diagnosis not present

## 2022-07-06 DIAGNOSIS — F4323 Adjustment disorder with mixed anxiety and depressed mood: Secondary | ICD-10-CM

## 2022-07-06 NOTE — Progress Notes (Unsigned)
                Brenda Foster [name], LMFT 
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
## 2022-07-29 ENCOUNTER — Ambulatory Visit: Payer: BC Managed Care – PPO | Admitting: Behavioral Health

## 2022-07-29 NOTE — Progress Notes (Unsigned)
                Willmer Fellers L Tavi Gaughran, LMFT 

## 2022-08-10 ENCOUNTER — Ambulatory Visit (INDEPENDENT_AMBULATORY_CARE_PROVIDER_SITE_OTHER): Payer: BC Managed Care – PPO | Admitting: Behavioral Health

## 2022-08-10 DIAGNOSIS — Z635 Disruption of family by separation and divorce: Secondary | ICD-10-CM | POA: Diagnosis not present

## 2022-08-10 DIAGNOSIS — F4323 Adjustment disorder with mixed anxiety and depressed mood: Secondary | ICD-10-CM | POA: Diagnosis not present

## 2022-08-10 NOTE — Progress Notes (Signed)
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 She is in the midst of a divorce that has stretched out for 2+ yrs.  Mental Status Exam: Appearance:  Casual     Behavior: Appropriate and Sharing  Motor: Normal  Speech/Language:  Clear and Coherent  Affect: Appropriate  Mood: normal  Thought process: normal  Thought content:   WNL  Sensory/Perceptual disturbances:   WNL  Orientation: oriented to person, place, and time/date  Attention: Good  Concentration: Good  Memory: WNL  Fund of knowledge:  Good  Insight:   Good  Judgment:  Good  Impulse Control: Good   Risk Assessment: Danger to Self:  No Self-injurious Behavior: No Danger to Others: No Duty to Warn:no Physical Aggression / Violence:No  Access to Firearms a concern: No  Gang Involvement:No   Subjective: Pt is @ work today saddened by her unresolved divorce proceedings & the impact on the 2 sets of twins. She is trying to be available to her children daily & she is chronically stressed due to the evolving situation. This week there will be a Calendar Date set w/the Marveen Reeks by the Atty for '[employer].  Pt is waking multiple times nightly worried for her situation & she is tending to obsess over it.  Interventions: Family Systems  Diagnosis:Marital conflict involving divorce  Adjustment reaction with anxiety and depression  Plan: Irona is sad today for the entire dissolution of her marriage. She is exp'g obsessional thoughts. Pt will counteract this tendency by recognizing the thought pattern quickly & distracting herself to other tasks, chores, thoughts & activities by creating a list she can quickly access to interrupt  this thinking pattern.   Azizi will try to attend a local Divorce Care Support Grp nearby to get a feeling for the efficacy of this support in her current situation.   Target Date: 08/17/2022  Progress: 4  Frequency: Twice monthly  Modality: Boykin Reaper, LMFT

## 2022-08-10 NOTE — Progress Notes (Signed)
                Brenda Foster L [name], LMFT 

## 2022-08-17 ENCOUNTER — Ambulatory Visit: Payer: BC Managed Care – PPO | Admitting: Behavioral Health

## 2022-08-27 ENCOUNTER — Ambulatory Visit (INDEPENDENT_AMBULATORY_CARE_PROVIDER_SITE_OTHER): Payer: BC Managed Care – PPO | Admitting: Behavioral Health

## 2022-08-27 DIAGNOSIS — Z635 Disruption of family by separation and divorce: Secondary | ICD-10-CM | POA: Diagnosis not present

## 2022-08-27 DIAGNOSIS — F4323 Adjustment disorder with mixed anxiety and depressed mood: Secondary | ICD-10-CM | POA: Diagnosis not present
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
## 2022-08-27 NOTE — Progress Notes (Signed)
                Bert Ptacek L Veleta Yamamoto, LMFT 

## 2022-09-22 ENCOUNTER — Ambulatory Visit (INDEPENDENT_AMBULATORY_CARE_PROVIDER_SITE_OTHER): Payer: BC Managed Care – PPO | Admitting: Behavioral Health

## 2022-09-22 DIAGNOSIS — Z635 Disruption of family by separation and divorce: Secondary | ICD-10-CM | POA: Diagnosis not present

## 2022-09-22 DIAGNOSIS — F4323 Adjustment disorder with mixed anxiety and depressed mood: Secondary | ICD-10-CM

## 2022-10-12 ENCOUNTER — Ambulatory Visit (INDEPENDENT_AMBULATORY_CARE_PROVIDER_SITE_OTHER): Payer: BC Managed Care – PPO | Admitting: Behavioral Health

## 2022-10-12 DIAGNOSIS — Z635 Disruption of family by separation and divorce: Secondary | ICD-10-CM | POA: Diagnosis not present

## 2022-10-12 DIAGNOSIS — F4323 Adjustment disorder with mixed anxiety and depressed mood: Secondary | ICD-10-CM

## 2022-10-12 NOTE — Progress Notes (Signed)
                Meggan Dhaliwal L Tamarius Rosenfield, LMFT 

## 2022-10-12 NOTE — Progress Notes (Signed)
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 [employer] Type: Individual Therapy  Reported Symptoms: Reduced anx/dep & stress due to marital dissolution  Mental Status Exam: Appearance:  Casual     Behavior: Appropriate and Sharing  Motor: Normal  Speech/Language:  Clear and Coherent  Affect: Appropriate  Mood: normal  Thought process: normal  Thought content:   WNL  Sensory/Perceptual disturbances:   WNL  Orientation: oriented to person, place, and time/date  Attention: Good  Concentration: Good  Memory: WNL  Fund of knowledge:  Good  Insight:   Good  Judgment:  Good  Impulse Control: Good   Risk Assessment: Danger to Self:  No Self-injurious Behavior: No Danger to Others: No Duty to Warn:no Physical Aggression / Violence:No  Access to Firearms a concern: No  Gang Involvement:No   Subjective: Pt is trying to adjust to situations around Divorce. Her Hearing for Divorce Dispensation has been pushed to June 2024. Pt does not trust her Atty who is self-acknowleged as a "Bulldog" in the Courtroom.   Interventions: Family Systems  Diagnosis:Adjustment reaction with anxiety and depression  Marital conflict involving divorce  Plan: Avrey will cont to record in her Notebook ideas, thought & feelings about what she is exp'g during this stressful time.  Target Date: 11/19/2022  Progress: 4  Frequency: Once every 3-4 wks  Modality: Claretta Fraise, LMFT

## 2022-10-15 ENCOUNTER — Telehealth: Payer: Self-pay | Admitting: Dermatology

## 2022-10-15 NOTE — Telephone Encounter (Signed)
Please let patient know we are not providing botox or cosmetic services at this time.  In the future when we start this service line, patients will need to set up a cosmetic consult to discuss the treatment plan in detail.  Insurance does not cover the cost of cosmetic consults so it's a $150 for the consult office visit.  The cost of Botox will depend on how many units she needs which would be determined at the consult visit.  -Dr. Onalee Hua

## 2022-10-15 NOTE — Telephone Encounter (Signed)
Patient left a voicemail yesterday afternoon inquiring about Botox. She wanted to know some more information in regards to it and roughly how much it would cost. She seems she is ready to schedule an appointment for it but you can advise scheduling if this is a service that we are scheduling for at the moment.

## 2022-10-19 NOTE — Addendum Note (Signed)
Addended by: Deneise Lever on: 10/19/2022 09:24 AM   Modules accepted: Level of Service

## 2022-10-19 NOTE — Progress Notes (Signed)
                Brenda Foster L Brenda Mangual, LMFT 

## 2022-10-19 NOTE — Progress Notes (Signed)
[facility] Counselor/Therapist Progress Note  Patient ID: Brenda Foster, MRN: [id number],    Date: 09/22/2022  Time Spent: 55 min Caregility video; Pt is in private & Provider working remotely from [employer]   Treatment Type: Individual Therapy  Reported Symptoms: Elevated anx/dep & grief due to ending of LT marriage & now estrangement from Husb  Mental Status Exam: Appearance:  Casual     Behavior: Appropriate and Sharing  Motor: Normal  Speech/Language:  Clear and Coherent  Affect: Appropriate  Mood: normal  Thought process: normal  Thought content:   WNL  Sensory/Perceptual disturbances:   WNL  Orientation: oriented to person, place, and time/date  Attention: Good  Concentration: Good  Memory: WNL  Fund of knowledge:  Good  Insight:   Good  Judgment:  Good  Impulse Control: Good   Risk Assessment: Danger to Self:  No Self-injurious Behavior: No Danger to Others: No Duty to Warn:no Physical Aggression / Violence:No  Access to Firearms a concern: No  Gang Involvement:No   Subjective: Pt relates today the progress of her adjustment to Divorce, the lack of time w/her children & her concerns for Dtr Annabelle Harman   Interventions: Family Systems using SFBT  Diagnosis:Adjustment reaction with anxiety and depression  Marital conflict involving divorce  Plan: Brenda Foster sts she is managing her emots much better in the past few wks, although her Husb has indirectly prevented her from securing furniture that belongs to her & the marital issues are now surrounding objects & things. Ragan will remind herself of the truth she places on the marital situation & how she narrates the story is w/in her control. She will record these times when she needs to regroup & we will discuss this @ our next session.   Target Date: 11/04/2022  Progress: 6  Frequency: Once every 3-4 wks  Modality: Claretta Fraise, LMFT

## 2022-11-03 ENCOUNTER — Ambulatory Visit (INDEPENDENT_AMBULATORY_CARE_PROVIDER_SITE_OTHER): Payer: BC Managed Care – PPO | Admitting: Behavioral Health

## 2022-11-03 DIAGNOSIS — F4323 Adjustment disorder with mixed anxiety and depressed mood: Secondary | ICD-10-CM | POA: Diagnosis not present

## 2022-11-03 DIAGNOSIS — Z635 Disruption of family by separation and divorce: Secondary | ICD-10-CM

## 2022-11-03 NOTE — Progress Notes (Signed)
[facility] Counselor/Therapist Progress Note  Patient ID: Brenda Foster, MRN: [id number],    Date: 11/03/2022  Time Spent: 55 min Caregility video; Pt is in a Conf Rm in private & Provider is working remotely from [facility]   Treatment Type: Individual Therapy  Reported Symptoms: Elevated anx/dep regarding the Graduation of [employer] & the likely celebration @ a Tourist information centre manager in town.  Mental Status Exam: Appearance:  Casual     Behavior: Appropriate and Sharing  Motor: Normal  Speech/Language:  Clear and Coherent and Normal Rate  Affect: Appropriate  Mood: normal  Thought process: normal  Thought content:   WNL  Sensory/Perceptual disturbances:   WNL  Orientation: oriented to person, place, and time/date  Attention: Good  Concentration: Good  Memory: WNL  Fund of knowledge:  Good  Insight:   Good  Judgment:  Good  Impulse Control: Good   Risk Assessment: Danger to Self:  No Self-injurious Behavior: No Danger to Others: No Duty to Warn:no Physical Aggression / Violence:No  Access to Firearms a concern: No  Gang Involvement:No   Subjective: Pt is adjusting to the Divorce situation & the adjustment of her children. She is holding her anger in  abeyance & her wishes for discussion w/her Ex-Husb's Twin Boys who reside w/him. The Final Dissolution of the Marriage is being held up by the Attorneys.   The Plans include a mutual Luncheon attendance. Pt has decided she does not have to wait for her Ex-Husb's invitation, she will take her own side of the Family to the same place & create a way for everyone to visit.   Interventions: Family Systems; Divorce adjustment  Diagnosis:Adjustment reaction with anxiety and depression  Marital conflict involving divorce  Plan: Brenda Foster has decided she will make her own plans for her Husb's biological children by giving them easy access to the entire Family. Discussed this decision @ length. Pt is still  grieving & cried often today when discussion was emot'l. She will download the APA Article, 'Why We Cry' to understand the essential elements of crying & how her way of dealing w/emotionality is a strength.  Target Date: 11/24/2022  Progress: 7  Frequency: Once every 3-4 wks  Modality: Claretta Fraise, LMFT

## 2022-11-03 NOTE — Progress Notes (Signed)
                Brenda Foster L Brenda Sahni, LMFT 

## 2022-11-09 NOTE — Addendum Note (Signed)
Addended by: Samyra Limb L on: 11/09/2022 01:08 PM   Modules accepted: Level of Service  

## 2022-11-24 ENCOUNTER — Ambulatory Visit: Payer: BC Managed Care – PPO | Admitting: Behavioral Health

## 2022-12-01 ENCOUNTER — Encounter: Payer: Self-pay | Admitting: Family Medicine

## 2022-12-01 ENCOUNTER — Ambulatory Visit (INDEPENDENT_AMBULATORY_CARE_PROVIDER_SITE_OTHER): Payer: BC Managed Care – PPO | Admitting: Family Medicine

## 2022-12-01 ENCOUNTER — Other Ambulatory Visit: Payer: Self-pay | Admitting: Family Medicine

## 2022-12-01 VITALS — BP 113/78 | HR 59 | Temp 98.4°F | Wt 164.8 lb

## 2022-12-01 DIAGNOSIS — Z8249 Family history of ischemic heart disease and other diseases of the circulatory system: Secondary | ICD-10-CM

## 2022-12-01 DIAGNOSIS — R251 Tremor, unspecified: Secondary | ICD-10-CM | POA: Diagnosis not present

## 2022-12-01 DIAGNOSIS — F418 Other specified anxiety disorders: Secondary | ICD-10-CM

## 2022-12-01 MED ORDER — PROPRANOLOL HCL 10 MG PO TABS: 10.0000 mg | ORAL_TABLET | Freq: Two times a day (BID) | ORAL | 1 refills | Status: DC

## 2022-12-01 MED ORDER — FLUOXETINE HCL 20 MG PO TABS: 40.0000 mg | ORAL_TABLET | Freq: Every day | ORAL | 1 refills | Status: DC

## 2022-12-01 MED ORDER — DICLOFENAC SODIUM 75 MG PO TBEC: 75.0000 mg | DELAYED_RELEASE_TABLET | Freq: Two times a day (BID) | ORAL | 1 refills | Status: DC

## 2022-12-01 MED ORDER — LORAZEPAM 0.5 MG PO TABS: 0.5000 mg | ORAL_TABLET | Freq: Three times a day (TID) | ORAL | 5 refills | Status: DC | PRN

## 2022-12-01 NOTE — Patient Instructions (Signed)
No follow-ups on file.        Great to see you today.  I have refilled the medication(s) we provide.   If labs were collected, we will inform you of lab results once received either by echart message or telephone call.   - echart message- for normal results that have been seen by the patient already.   - telephone call: abnormal results or if patient has not viewed results in their echart.  

## 2022-12-01 NOTE — Progress Notes (Signed)
Brenda Foster , Oct 12, 1965, 57 y.o., female MRN: [id number] Patient Care Team    Relationship Specialty Notifications Start End  Natalia Leatherwood, DO PCP - General Family Medicine  05/12/16   Lendon Colonel, DO Referring Physician   05/12/16    Comment: psych  Maxie Better, MD Consulting Physician Obstetrics and Gynecology  05/12/16     Chief Complaint  Patient presents with   Anxiety     Subjective:Brenda Foster is a 57 y.o.  Pt presents for an OV for Chronic Conditions/illness Management  Hyperlipidemia/fh heart disease: Pt made dietary changes- avoided sugar and fried foods. Compliant with Lipitor Exericse: walking but not routine exercise.   Anxiety/tremor/grief:.  Patient reports she feels her medications are working well for her for on Prozac 40 mg daily and the propranolol 10 mg twice daily. She will occasionally take an ativan when feeling more depressed or anxious.   Prior note: She reports occasional continue grief and tearfulness when thinkingof her father that passed earlier in the year. She is also going through a separation with her husband and moved out in March.  Original note: Pt presents for an OV with complaints of anxiety and tremor from prozac of 1.5 year duration.  Condition is well controlled on prozac 30 mg and Propanolol 10 mg BID. Her prior psychiatric provider has moved and she would like to come here for her care surrounding this issue.    Pain: She reports compliance with diclofenac. She states this helps keep her active and comfortable.     12/01/2022   10:04 AM 06/05/2022    8:56 AM 09/30/2021   10:14 AM 04/15/2021    9:08 AM 12/19/2020    9:54 AM  Depression screen PHQ 2/9  Decreased Interest 0 0 0 0 1  Down, Depressed, Hopeless 1 2 1  0 1  PHQ - 2 Score 1 2 1  0 2  Altered sleeping 1 0 0  0  Tired, decreased energy 1 1 0  1  Change in appetite 0 3 1  2   Feeling bad or failure about yourself  1 2 1  1   Trouble  concentrating 1 1 0  0  Moving slowly or fidgety/restless 0 0 0  0  Suicidal thoughts 0 0 0    PHQ-9 Score 5 9 3  6     Allergies  Allergen Reactions   Heparin     Do not give per MD   Sulfa Antibiotics Hives   Social History   Social History Narrative   Lives with husband and children. 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   Past Medical History:  Diagnosis Date   Anxiety    Depression    Mild n/ infertility   History of depression 12/22/2012   Treated for about 10 years w/prozac. Stopped about 2 ya.   History of miscarriage    Infertility    Interstitial cystitis    Low back pain    Migraines    Sepsis (HCC) 12/2016   in hospital 1 week   Urinary tract bacterial infections    2x   Urine incontinence    Mild   Past Surgical History:  Procedure Laterality Date   APPENDECTOMY  1980   BREAST SURGERY  1986   Reduction   CERVICAL CONE BIOPSY  1990   CESAREAN SECTION  2003   1 time   COLONOSCOPY  09/30/2017   polyps   DILATION AND CURETTAGE OF UTERUS  1999-2002  HYSTEROSCOPY  2001   LAPAROSCOPY  2001   LASIK     TUBAL LIGATION  2003   WISDOM TOOTH EXTRACTION     Family History  Problem Relation Age of Onset   Cancer Father        prostate   Heart disease Father 71       MI, premature   Stroke Father        Mind   Prostate cancer Father    Stroke Maternal Grandfather    Anemia Mother    Autism spectrum disorder Daughter    Stroke Maternal Grandmother    Colon cancer Neg Hx    Esophageal cancer Neg Hx    Liver cancer Neg Hx    Pancreatic cancer Neg Hx    Rectal cancer Neg Hx    Stomach cancer Neg Hx    Allergies as of 12/01/2022       Reactions   Heparin    Do not give per MD   Sulfa Antibiotics Hives        Medication List        Accurate as of December 01, 2022 10:19 AM. If you have any questions, ask your nurse or doctor.          atorvastatin 10 MG tablet Commonly known as: LIPITOR Take 1 tablet (10 mg total) by mouth every evening.    diclofenac 75 MG EC tablet Commonly known as: VOLTAREN Take 1 tablet (75 mg total) by mouth 2 (two) times daily.   FLUoxetine 20 MG tablet Commonly known as: PROZAC Take 2 tablets (40 mg total) by mouth daily.   LORazepam 0.5 MG tablet Commonly known as: ATIVAN Take 1 tablet (0.5 mg total) by mouth every 8 (eight) hours as needed for anxiety.   multivitamin tablet Take 1 tablet by mouth daily.   propranolol 10 MG tablet Commonly known as: INDERAL Take 1 tablet (10 mg total) by mouth 2 (two) times daily. Needs appointment for additional refills.        All past medical history, surgical history, allergies, family history, immunizations andmedications were updated in the EMR today and reviewed under the history and medication portions of their EMR.     ROS: Negative, with the exception of above mentioned in HPI   Objective:  BP 113/78   Pulse (!) 59   Temp 98.4 F (36.9 C)   Wt 164 lb 12.8 oz (74.8 kg)   LMP 01/20/2016 (Approximate)   SpO2 98%   BMI 26.60 kg/m  Body mass index is 26.6 kg/m. Physical Exam Vitals and nursing note reviewed.  Constitutional:      General: She is not in acute distress.    Appearance: Normal appearance. She is not ill-appearing, toxic-appearing or diaphoretic.  HENT:     Head: Normocephalic and atraumatic.  Eyes:     General: No scleral icterus.       Right eye: No discharge.        Left eye: No discharge.     Extraocular Movements: Extraocular movements intact.     Conjunctiva/sclera: Conjunctivae normal.     Pupils: Pupils are equal, round, and reactive to light.  Cardiovascular:     Rate and Rhythm: Normal rate and regular rhythm.  Pulmonary:     Effort: Pulmonary effort is normal. No respiratory distress.     Breath sounds: Normal breath sounds. No wheezing, rhonchi or rales.  Musculoskeletal:     Right lower leg: No edema.  Left lower leg: No edema.  Skin:    General: Skin is warm.     Findings: No rash.   Neurological:     Mental Status: She is alert and oriented to person, place, and time. Mental status is at baseline.     Motor: No weakness.     Gait: Gait normal.  Psychiatric:        Mood and Affect: Mood normal.        Behavior: Behavior normal.        Thought Content: Thought content normal.        Judgment: Judgment normal.     No results found. No results found. No results found for this or any previous visit (from the past 24 hour(s)).  Assessment/Plan: Brenda Foster is a 56 y.o. female present for Chronic Conditions/illness Management FH: heart disease/Hyperlipidemia LDL goal <100 Stable Continue  lipitor.  Labs due next visit.   Situational anxiety/Tremor/grief Stable Continue ativan 0.5mg  TID PRN. - NCCS database reviewed 12/01/22 Continue Prozac  40 mg QD Continue propanol 10 mg BID (tremor 2/2 to prozac).  - referred to therapist today for her   Lumbar pain/Bilateral hip pain/Polyarthralgia Stable Continue diclofenac   Reviewed expectations re: course of current medical issues. Discussed self-management of symptoms. Outlined signs and symptoms indicating need for more acute intervention. Patient verbalized understanding and all questions were answered. Patient received an After-Visit Summary.    No orders of the defined types were placed in this encounter.  Meds ordered this encounter  Medications   diclofenac (VOLTAREN) 75 MG EC tablet    Sig: Take 1 tablet (75 mg total) by mouth 2 (two) times daily.    Dispense:  180 tablet    Refill:  1   FLUoxetine (PROZAC) 20 MG tablet    Sig: Take 2 tablets (40 mg total) by mouth daily.    Dispense:  180 tablet    Refill:  1   LORazepam (ATIVAN) 0.5 MG tablet    Sig: Take 1 tablet (0.5 mg total) by mouth every 8 (eight) hours as needed for anxiety.    Dispense:  90 tablet    Refill:  5   propranolol (INDERAL) 10 MG tablet    Sig: Take 1 tablet (10 mg total) by mouth 2 (two) times daily. Needs  appointment for additional refills.    Dispense:  180 tablet    Refill:  1    Referral Orders  No referral(s) requested today      Note is dictated utilizing voice recognition software. Although note has been proof read prior to signing, occasional typographical errors still can be missed. If any questions arise, please do not hesitate to call for verification.   electronically signed by:  Felix Pacini, DO  [facility] [place]

## 2022-12-11 ENCOUNTER — Ambulatory Visit: Payer: BC Managed Care – PPO | Admitting: Behavioral Health

## 2022-12-11 NOTE — Progress Notes (Unsigned)
                Martine Trageser L Keiston Manley, LMFT 

## 2023-03-22 ENCOUNTER — Encounter: Payer: Self-pay | Admitting: Family Medicine

## 2023-03-22 ENCOUNTER — Ambulatory Visit: Payer: BC Managed Care – PPO | Admitting: Family Medicine

## 2023-03-22 VITALS — BP 112/72 | Ht 66.5 in | Wt 160.0 lb

## 2023-03-22 DIAGNOSIS — M67813 Other specified disorders of tendon, right shoulder: Secondary | ICD-10-CM | POA: Insufficient documentation

## 2023-03-22 DIAGNOSIS — M75101 Unspecified rotator cuff tear or rupture of right shoulder, not specified as traumatic: Secondary | ICD-10-CM | POA: Insufficient documentation

## 2023-03-22 DIAGNOSIS — M67911 Unspecified disorder of synovium and tendon, right shoulder: Secondary | ICD-10-CM | POA: Diagnosis not present

## 2023-03-22 NOTE — Assessment & Plan Note (Addendum)
Exam and external MRI reviewed, consistent supraspinatus full tendon tear. Patient presenting for second opinion.  Discussed treatment options with patient, it is likely that due to the extent of her tear that she will require surgery as she previously discussed with Dr. Dion Saucier.  However, she has not trialed physical therapy for 4 to 6 weeks.  Patient would like physical therapy before considering surgical intervention.   Physical therapy referral sent.  Continue Voltaren twice daily as needed. Discussed risk of another cortisone injection - not recommended at this time, as could potentially worsen her condition and worsen the tear Follow-up 4 to 6 weeks. Consider referral back to Dr Dion Saucier for surgery if not improving with PT. Patient expressed understanding and agreement with above

## 2023-03-22 NOTE — Progress Notes (Deleted)
PCP: Natalia Leatherwood, DO  Subjective:   HPI: Patient is a 57 y.o. female here for ***.  ***  Past Medical History:  Diagnosis Date   Anxiety    Depression    Mild n/ infertility   History of depression 12/22/2012   Treated for about 10 years w/prozac. Stopped about 2 ya.   History of miscarriage    Infertility    Interstitial cystitis    Low back pain    Migraines    Sepsis (HCC) 12/2016   in hospital 1 week   Urinary tract bacterial infections    2x   Urine incontinence    Mild    Current Outpatient Medications on File Prior to Visit  Medication Sig Dispense Refill   atorvastatin (LIPITOR) 10 MG tablet Take 1 tablet (10 mg total) by mouth every evening. 90 tablet 3   diclofenac (VOLTAREN) 75 MG EC tablet Take 1 tablet (75 mg total) by mouth 2 (two) times daily. 180 tablet 1   FLUoxetine (PROZAC) 20 MG tablet Take 2 tablets (40 mg total) by mouth daily. 180 tablet 1   LORazepam (ATIVAN) 0.5 MG tablet Take 1 tablet (0.5 mg total) by mouth every 8 (eight) hours as needed for anxiety. 90 tablet 5   Multiple Vitamin (MULTIVITAMIN) tablet Take 1 tablet by mouth daily.     propranolol (INDERAL) 10 MG tablet Take 1 tablet (10 mg total) by mouth 2 (two) times daily. Needs appointment for additional refills. 180 tablet 1   No current facility-administered medications on file prior to visit.    Past Surgical History:  Procedure Laterality Date   APPENDECTOMY  1980   BREAST SURGERY  1986   Reduction   CERVICAL CONE BIOPSY  1990   CESAREAN SECTION  2003   1 time   COLONOSCOPY  09/30/2017   polyps   DILATION AND CURETTAGE OF UTERUS  1999-2002   HYSTEROSCOPY  2001   LAPAROSCOPY  2001   LASIK     TUBAL LIGATION  2003   WISDOM TOOTH EXTRACTION      Allergies  Allergen Reactions   Heparin     Do not give per MD   Sulfa Antibiotics Hives    LMP 01/20/2016 (Approximate)       No data to display              No data to display              Objective:   Physical Exam:  Gen: NAD, comfortable in exam room  ***   Assessment & Plan:   Assessment & Plan   Some or all of this note was dictated use Dragon software, there may be unintentional errors present.  Celine Mans, MD, PGY-2 [facility] 1:28 PM 03/22/2023

## 2023-03-22 NOTE — Patient Instructions (Signed)
You have a rotator cuff tear of your right shoulder - I placed a referral to Physical therapy - you can call them to schedule - you can continue you Voltaren 75mg  1 tab twice daily as needed - you should follow-up with me in 4-6 weeks to re-evaluate - you can reach out with any questions. Thanks!

## 2023-03-22 NOTE — Progress Notes (Signed)
SUBJECTIVE:   CHIEF COMPLAINT / HPI: right shoulder pain  Brenda Foster is a pleasant 57 year old woman presenting today for second opinion of right shoulder pain.  She was previously evaluated by Dr. Dion Saucier at [facility] for right shoulder pain after injury pressure washing.  She tried corticosteroid injection and anti-inflammatory relief over the past several months which has not relieved her pain.  She is limited functionally with any movement above 90 degrees of shoulder flexion and abduction.  She is right-handed.  She brings in clinic notes and MRI results from [facility] today.  Patient was told that her rotator cuff injury would not improve with physical therapy and that Dr. Dion Saucier recommended surgical intervention.  She is here today for second opinion.  MRI note reviewed demonstrating a partial-thickness and full-thickness tear of supraspinatus.  PERTINENT  PMH / PSH: Hyperlipidemia, chronic lumbar pain  OBJECTIVE:   BP 112/72   Ht 5' 6.5" (1.689 m)   Wt 160 lb (72.6 kg)   LMP 01/20/2016 (Approximate)   BMI 25.44 kg/m   General: NAD, well appearing Neuro: A&O, sensation intact to light touch Respiratory: normal WOB on RA Extremities:   Right shoulder Inspection: No obvious deformity, erythema, swelling, ecchymoses Palpation: (+)TTP bicipital groove and greater tuberosity, no TTP at clavicle, AC joint, acromion, scapular ridge ROM: near full range of motion in all planes with (+)painful arch above 90-deg of abduction Special Tests: Positive painful arc, positive empty can, positive Hawkins, positive Neer's, negative Speeds, (+)drop arm Strength: Strength 4-/5 right shoulder flexion, abduction, ER, internalrotation 5/5  LT shoulder with FROM without pain, weakness, instability   ASSESSMENT/PLAN:   Assessment & Plan Nontraumatic tear of right supraspinatus tendon Exam and external MRI reviewed, consistent supraspinatus full tendon tear. Patient presenting for second  opinion.  Discussed treatment options with patient, it is likely that due to the extent of her tear that she will require surgery as she previously discussed with Dr. Dion Saucier.  However, she has not trialed physical therapy for 4 to 6 weeks.  Patient would like physical therapy before considering surgical intervention.   Physical therapy referral sent.  Continue Voltaren twice daily as needed. Discussed risk of another cortisone injection - not recommended at this time, as could potentially worsen her condition and worsen the tear Follow-up 4 to 6 weeks. Consider referral back to Dr Dion Saucier for surgery if not improving with PT. Patient expressed understanding and agreement with above Tendinopathy of right rotator cuff Tendinopathy of infraspinatus and subscapularis as seen on MRI right shoulder 02/18/2023 completed at [facility] MRI images personally reviewed by me in office today on disc that patient brought along with her.  She has partial-thickness bursal surface tearing of the supraspinatus, as well as full-thickness tearing along the posterior aspect of the tendon.  Some underlying tendinosis.  Also has mild tendinosis of infraspinatus and subscapularis, along with mild tendinosis of the intra-articular portion of the long head of the biceps tendon. Prior notes from visits with Dr. Dion Saucier also reviewed.  They will be scanned into the chart.  She was advised right shoulder arthroscopy by Dr. Dion Saucier, but she is hesitant to proceed with surgery unless absolutely necessary.  She is not given trial of PT.  She would like to try this.  Referral was given. Will follow-up 4 to 6 weeks after trial of PT, will consider referral back to Dr. Dion Saucier if no improvement. Continue Voltaren 75 mg p.o. twice daily as needed with food.  GI precautions  reviewed Encouraged to reach out with any questions or concerns, patient expressed understanding agreement with above Biceps tendinosis of right  shoulder Tendinopathy of infraspinatus and subscapularis as seen on MRI right shoulder 02/18/2023 completed at [facility] MRI images personally reviewed by me in office today on disc that patient brought along with her.  She has partial-thickness bursal surface tearing of the supraspinatus, as well as full-thickness tearing along the posterior aspect of the tendon.  Some underlying tendinosis.  Also has mild tendinosis of infraspinatus and subscapularis, along with mild tendinosis of the intra-articular portion of the long head of the biceps tendon. Prior notes from visits with Dr. Dion Saucier also reviewed.  They will be scanned into the chart.  She was advised right shoulder arthroscopy by Dr. Dion Saucier, but she is hesitant to proceed with surgery unless absolutely necessary.  She is not given trial of PT.  She would like to try this.  Referral was given. Will follow-up 4 to 6 weeks after trial of PT, will consider referral back to Dr. Dion Saucier if no improvement. Continue Voltaren 75 mg p.o. twice daily as needed with food.  GI precautions reviewed Encouraged to reach out with any questions or concerns, patient expressed understanding agreement with above  Brenda Mans, MD, PGY-2 [facility] 4:47 PM 03/22/2023    DATE OF VISIT: 03/22/2023       Brenda Foster DOB: December 20, 1965 MRN: [id number]  ATTENDING ADDENDUM:  Patient seen and examined with resident physician, Brenda Mans, MD, PGY-2.  I agree with findings and plan as noted in resident note, updates made to the note as above.  EXAM: Agree with exam as documented in Resident note  IMAGING:  MRI right shoulder 02/18/2023 completed at [facility] - MRI images personally reviewed by me in office today on disc that patient brought along with her.  She has partial-thickness bursal surface tearing of the supraspinatus, as well as full-thickness tearing along the posterior aspect of the tendon.  Some  underlying tendinosis.  Also has mild tendinosis of infraspinatus and subscapularis, along with mild tendinosis of the intra-articular portion of the long head of the biceps tendon.  A/P: 1. As noted above in resident note  Please refer to resident's note for additional details of the visit.   Encounter Diagnoses  Name Primary?   Nontraumatic tear of right supraspinatus tendon Yes   Tendinopathy of right rotator cuff    Biceps tendinosis of right shoulder     Orders Placed This Encounter  Procedures   Ambulatory referral to Physical Therapy

## 2023-03-22 NOTE — Assessment & Plan Note (Addendum)
Tendinopathy of infraspinatus and subscapularis as seen on MRI right shoulder 02/18/2023 completed at [facility] MRI images personally reviewed by me in office today on disc that patient brought along with her.  She has partial-thickness bursal surface tearing of the supraspinatus, as well as full-thickness tearing along the posterior aspect of the tendon.  Some underlying tendinosis.  Also has mild tendinosis of infraspinatus and subscapularis, along with mild tendinosis of the intra-articular portion of the long head of the biceps tendon. Prior notes from visits with Dr. Dion Saucier also reviewed.  They will be scanned into the chart.  She was advised right shoulder arthroscopy by Dr. Dion Saucier, but she is hesitant to proceed with surgery unless absolutely necessary.  She is not given trial of PT.  She would like to try this.  Referral was given. Will follow-up 4 to 6 weeks after trial of PT, will consider referral back to Dr. Dion Saucier if no improvement. Continue Voltaren 75 mg p.o. twice daily as needed with food.  GI precautions reviewed Encouraged to reach out with any questions or concerns, patient expressed understanding agreement with above

## 2023-03-30 ENCOUNTER — Other Ambulatory Visit: Payer: Self-pay

## 2023-03-30 ENCOUNTER — Ambulatory Visit: Payer: BC Managed Care – PPO | Attending: Family Medicine

## 2023-03-30 DIAGNOSIS — M25511 Pain in right shoulder: Secondary | ICD-10-CM | POA: Diagnosis not present

## 2023-03-30 DIAGNOSIS — M6281 Muscle weakness (generalized): Secondary | ICD-10-CM | POA: Insufficient documentation

## 2023-03-30 DIAGNOSIS — R252 Cramp and spasm: Secondary | ICD-10-CM

## 2023-03-30 DIAGNOSIS — M25611 Stiffness of right shoulder, not elsewhere classified: Secondary | ICD-10-CM | POA: Insufficient documentation

## 2023-03-30 DIAGNOSIS — M75101 Unspecified rotator cuff tear or rupture of right shoulder, not specified as traumatic: Secondary | ICD-10-CM | POA: Insufficient documentation

## 2023-03-30 DIAGNOSIS — M67911 Unspecified disorder of synovium and tendon, right shoulder: Secondary | ICD-10-CM | POA: Insufficient documentation

## 2023-03-30 DIAGNOSIS — M67813 Other specified disorders of tendon, right shoulder: Secondary | ICD-10-CM | POA: Insufficient documentation

## 2023-03-30 NOTE — Therapy (Signed)
OUTPATIENT PHYSICAL THERAPY UPPER EXTREMITY EVALUATION   Patient Name: Brenda Foster MRN: [id number] DOB:06-10-1966, 57 y.o., female Today's Date: 03/30/2023  END OF SESSION:  PT End of Session - 03/30/23 0824     Visit Number 1    Date for PT Re-Evaluation 05/25/23    Authorization Type BCBS    PT Start Time 0802    PT Stop Time 0845    PT Time Calculation (min) 43 min    Activity Tolerance Patient limited by pain    Behavior During Therapy [facility] for tasks assessed/performed             Past Medical History:  Diagnosis Date   Anxiety    Depression    Mild n/ infertility   History of depression 12/22/2012   Treated for about 10 years w/prozac. Stopped about 2 ya.   History of miscarriage    Infertility    Interstitial cystitis    Low back pain    Migraines    Sepsis (HCC) 12/2016   in hospital 1 week   Urinary tract bacterial infections    2x   Urine incontinence    Mild   Past Surgical History:  Procedure Laterality Date   APPENDECTOMY  1980   BREAST SURGERY  1986   Reduction   CERVICAL CONE BIOPSY  1990   CESAREAN SECTION  2003   1 time   COLONOSCOPY  09/30/2017   polyps   DILATION AND CURETTAGE OF UTERUS  1999-2002   HYSTEROSCOPY  2001   LAPAROSCOPY  2001   LASIK     TUBAL LIGATION  2003   WISDOM TOOTH EXTRACTION     Patient Active Problem List   Diagnosis Date Noted   Nontraumatic tear of right supraspinatus tendon 03/22/2023   Tendinopathy of right rotator cuff 03/22/2023   Biceps tendinosis of right shoulder 03/22/2023   Hyperlipidemia LDL goal <100 04/15/2021   FH: heart disease 12/20/2020   Exercise intolerance 10/06/2019   Night sweats 12/15/2017   Lumbar pain 12/15/2017   Bilateral hip pain 12/15/2017   Polyarthralgia 12/15/2017   Tremor 08/27/2017   Situational anxiety 10/26/2014   Stress incontinence in female 10/12/2013    PCP: Natalia Leatherwood, DO   REFERRING PROVIDER: Andi Devon, DO  REFERRING DIAG: M75.101  (ICD-10-CM) - Nontraumatic tear of right supraspinatus tendon M67.911 (ICD-10-CM) - Tendinopathy of right rotator cuff M67.813 (ICD-10-CM) - Biceps tendinosis of right shoulder  THERAPY DIAG:  Acute pain of right shoulder  Stiffness of right shoulder, not elsewhere classified  Cramp and spasm  Muscle weakness (generalized)  Rationale for Evaluation and Treatment: Rehabilitation  ONSET DATE: 03/22/2023  SUBJECTIVE:  SUBJECTIVE STATEMENT: Patient reports injury to right shoulder in May of 2024.  Saw Dr. Dion Saucier who tried cortisone and oral meds.  Patient continued to have pain and Dr. Dion Saucier subsequently suggested an MRI.  MRI revealed "full thickness tear" per patient.  She does not recall any specifics but was told it was full thickness and some minor tears of the other rotator cuff structures.  After MRI, Dr. Dion Saucier suggested surgery but patient wanted second opinion.  Sought 2nd opinion from Dr. Christella Hartigan.  Dr. Christella Hartigan has suggested PT to see if this will resolve or allow her to manage her symptoms.  She has pain with overhead reach or reaching out to the side and some pain in the elbow on the right side.  She is also having difficulty sleeping.  She hopes to avoid surgery if possible.  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  She enjoyed playing pickle ball and doing barre classes but has not been able to do this since her injury.   Hand dominance: Right  PERTINENT HISTORY: na  PAIN:  Are you having pain? Yes: NPRS scale: 2 at rest, 6 with reaching up/10 Pain location: right shoulder Pain description: sharp , aching Aggravating factors: rieaching up Relieving factors: rest, ice, medication  PRECAUTIONS: None  RED FLAGS: None   WEIGHT BEARING RESTRICTIONS:  No  FALLS:  Has patient fallen in last 6 months? No  LIVING ENVIRONMENT: Lives with: lives alone Lives in: House/apartment   OCCUPATION: School psychologist  PLOF: Independent, Independent with basic ADLs, Independent with household mobility without device, Independent with community mobility without device, Independent with homemaking with ambulation, Independent with gait, and Independent with transfers  PATIENT GOALS: She hopes to avoid surgery if possible  NEXT MD VISIT: na  OBJECTIVE:  Note: Objective measures were completed at Evaluation unless otherwise noted.  DIAGNOSTIC FINDINGS:  MRI done at [facility], not available  PATIENT SURVEYS :  FOTO 38, predicted 52  COGNITION: Overall cognitive status: Within functional limits for tasks assessed     SENSATION: WFL  POSTURE: WFL  UPPER EXTREMITY ROM:   Right shoulder flexion: approx 165 degrees Right shoulder abduction : approx 160 degrees Right shoulder ER: approx 75 degrees Right shoulder IR: approx 45 degrees  UPPER EXTREMITY MMT:  Held due to pain but no obvious compensation on fwd flexion or abduction  SHOULDER SPECIAL TESTS: Impingement tests: Painful arc test: positive   Rotator cuff assessment: Drop arm test: negative  Biceps assessment: Speed's test: positive     TODAY'S TREATMENT:                                                                                                                                         DATE: 03/30/23 Initial eval completed and initiated HEP  PATIENT EDUCATION: Education details: Initiated HEP and educated on anatomy of the  shoulder and what "full thickness" means and any concerns for putting off surgery.   Person educated: Patient Education method: Explanation, Demonstration, Verbal cues, and Handouts Education comprehension: verbalized understanding, returned demonstration, and verbal cues required  HOME EXERCISE PROGRAM: Access Code: [email or web address] URL:  [email or web address] Date: 03/30/2023 Prepared by: Mikey Kirschner  Exercises - Prone Shoulder Extension - Single Arm  - 2 x daily - 7 x weekly - 2 sets - 10 reps - Prone Shoulder Row  - 2 x daily - 7 x weekly - 2 sets - 10 reps - Prone Single Arm Shoulder Horizontal Abduction with Scapular Retraction and Palm Down  - 2 x daily - 7 x weekly - 2 sets - 10 reps - Sidelying Shoulder External Rotation  - 2 x daily - 7 x weekly - 2 sets - 10 reps - Single Arm Serratus Punches in Supine with Dumbbell  - 2 x daily - 7 x weekly - 2 sets - 10 reps - Standing Shoulder Flexion to 90 Degrees with Dumbbells  - 2 x daily - 7 x weekly - 2 sets - 10 reps - Standing Shoulder Scaption  - 2 x daily - 7 x weekly - 2 sets - 10 reps  ASSESSMENT:  CLINICAL IMPRESSION: Patient is a 57 y.o. female who was seen today for physical therapy evaluation and treatment for right rotator cuff tear.   She presents with decreased ROM, strength and function along with elevated pain and positive signs of impingement and rotator cuff pathology.  She also has some biceps pain indicating possible labrum involvement.  She would benefit from rotator cuff strengthening, shoulder and scapular stabilization, pain control and functional strength training.  We will use ice or other modalities to control inflammation.  She would benefit from skilled PT to address the above mentioned deficits.     OBJECTIVE IMPAIRMENTS: decreased ROM, decreased strength, increased fascial restrictions, increased muscle spasms, impaired flexibility, impaired UE functional use, and pain.   ACTIVITY LIMITATIONS: carrying, lifting, sleeping, transfers, bed mobility, bathing, dressing, and hygiene/grooming  PARTICIPATION LIMITATIONS: meal prep, cleaning, laundry, driving, shopping, community activity, occupation, and yard work  PERSONAL FACTORS: Age, Fitness, Profession, and Time since onset of injury/illness/exacerbation are also affecting  patient's functional outcome.   REHAB POTENTIAL: Good  CLINICAL DECISION MAKING: Stable/uncomplicated  EVALUATION COMPLEXITY: Low  GOALS: Goals reviewed with patient? Yes  SHORT TERM GOALS: Target date: 04/27/2023   Patient will be independent with initial HEP  Baseline: Goal status: INITIAL  2.  Pain report to be no greater than 4/10  Baseline:  Goal status: INITIAL   LONG TERM GOALS: Target date: 05/25/2023   Patient to be independent with advanced HEP  Baseline:  Goal status: INITIAL  2.  Patient to report pain no greater than 2/10  Baseline:  Goal status: INITIAL  3.  FOTO to be 52 Baseline:  Goal status: INITIAL  4.  Patient will be able to reach overhead into cabinets and on top of shelves without pain  Baseline:  Goal status: INITIAL  5.  Patient to be able to sleep through the night  Baseline:  Goal status: INITIAL  6.  Patient to report 85% improvement in overall symptoms  Baseline:  Goal status: INITIAL  PLAN: PT FREQUENCY: 2x/week  PT DURATION: 8 weeks  PLANNED INTERVENTIONS: Therapeutic exercises, Therapeutic activity, Neuromuscular re-education, Balance training, Patient/Family education, Self Care, Joint mobilization, Stair training, Aquatic Therapy, Dry Needling, Electrical stimulation, Spinal mobilization, Cryotherapy, Moist heat, Splintting, Taping, Vasopneumatic device, Traction,  Ultrasound, Ionotophoresis 4mg /ml Dexamethasone, Manual therapy, and Re-evaluation  PLAN FOR NEXT SESSION: Review HEP, UBE, begin stabilization   Malosi Hemstreet B. Lashona Schaaf, PT 03/30/23 3:04 PM [facility] [address] [place], [place] [address] Phone # [phone] Fax [phone]

## 2023-04-06 ENCOUNTER — Ambulatory Visit: Payer: BC Managed Care – PPO | Admitting: Physical Therapy

## 2023-04-06 ENCOUNTER — Encounter: Payer: Self-pay | Admitting: Physical Therapy

## 2023-04-06 DIAGNOSIS — M75101 Unspecified rotator cuff tear or rupture of right shoulder, not specified as traumatic: Secondary | ICD-10-CM | POA: Diagnosis not present

## 2023-04-06 DIAGNOSIS — R252 Cramp and spasm: Secondary | ICD-10-CM

## 2023-04-06 DIAGNOSIS — M25511 Pain in right shoulder: Secondary | ICD-10-CM

## 2023-04-06 DIAGNOSIS — M25611 Stiffness of right shoulder, not elsewhere classified: Secondary | ICD-10-CM

## 2023-04-06 DIAGNOSIS — M6281 Muscle weakness (generalized): Secondary | ICD-10-CM

## 2023-04-06 NOTE — Therapy (Signed)
OUTPATIENT PHYSICAL THERAPY UPPER EXTREMITY TREATMENT   Patient Name: Brenda Foster MRN: [id number] DOB:[birth year], 57 y.o., female Today's Date: 04/06/2023  END OF SESSION:  PT End of Session - 04/06/23 1706     Visit Number 2    Date for PT Re-Evaluation 05/25/23    Authorization Type BCBS    PT Start Time 1617    PT Stop Time 1658    PT Time Calculation (min) 41 min    Activity Tolerance Patient tolerated treatment well    Behavior During Therapy WFL for tasks assessed/performed              Past Medical History:  Diagnosis Date   Anxiety    Depression    Mild n/ infertility   History of depression 12/22/2012   Treated for about 10 years w/prozac. Stopped about 2 ya.   History of miscarriage    Infertility    Interstitial cystitis    Low back pain    Migraines    Sepsis (HCC) 12/2016   in hospital 1 week   Urinary tract bacterial infections    2x   Urine incontinence    Mild   Past Surgical History:  Procedure Laterality Date   APPENDECTOMY  1980   BREAST SURGERY  1986   Reduction   CERVICAL CONE BIOPSY  1990   CESAREAN SECTION  2003   1 time   COLONOSCOPY  09/30/2017   polyps   DILATION AND CURETTAGE OF UTERUS  1999-2002   HYSTEROSCOPY  2001   LAPAROSCOPY  2001   LASIK     TUBAL LIGATION  2003   WISDOM TOOTH EXTRACTION     Patient Active Problem List   Diagnosis Date Noted   Nontraumatic tear of right supraspinatus tendon 03/22/2023   Tendinopathy of right rotator cuff 03/22/2023   Biceps tendinosis of right shoulder 03/22/2023   Hyperlipidemia LDL goal <100 04/15/2021   FH: heart disease 12/20/2020   Exercise intolerance 10/06/2019   Night sweats 12/15/2017   Lumbar pain 12/15/2017   Bilateral hip pain 12/15/2017   Polyarthralgia 12/15/2017   Tremor 08/27/2017   Situational anxiety 10/26/2014   Stress incontinence in female 10/12/2013    PCP: Natalia Leatherwood, DO   REFERRING PROVIDER: Andi Devon, DO  REFERRING DIAG:  M75.101 (ICD-10-CM) - Nontraumatic tear of right supraspinatus tendon M67.911 (ICD-10-CM) - Tendinopathy of right rotator cuff M67.813 (ICD-10-CM) - Biceps tendinosis of right shoulder  THERAPY DIAG:  Acute pain of right shoulder  Stiffness of right shoulder, not elsewhere classified  Cramp and spasm  Muscle weakness (generalized)  Rationale for Evaluation and Treatment: Rehabilitation  ONSET DATE: 03/22/2023  SUBJECTIVE:  SUBJECTIVE STATEMENT: Patient reports she is doing good today. Sitting statically she has 0/10 pain with movement she has 5-7/10 pain. She has been doing her HEP exercises at least once a day.   Eval: Patient reports injury to right shoulder in May of 2024.  Saw Dr. Dion Saucier who tried cortisone and oral meds.  Patient continued to have pain and Dr. Dion Saucier subsequently suggested an MRI.  MRI revealed "full thickness tear" per patient.  She does not recall any specifics but was told it was full thickness and some minor tears of the other rotator cuff structures.  After MRI, Dr. Dion Saucier suggested surgery but patient wanted second opinion.  Sought 2nd opinion from Dr. Christella Hartigan.  Dr. Christella Hartigan has suggested PT to see if this will resolve or allow her to manage her symptoms.  She has pain with overhead reach or reaching out to the side and some pain in the elbow on the right side.  She is also having difficulty sleeping.  She hopes to avoid surgery if possible.  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  She enjoyed playing pickle ball and doing barre classes but has not been able to do this since her injury.   Hand dominance: Right  PERTINENT HISTORY: na  PAIN:  Are you having pain? Yes: NPRS scale: 2 at rest, 6 with reaching up/10 Pain location: right  shoulder Pain description: sharp , aching Aggravating factors: rieaching up Relieving factors: rest, ice, medication  PRECAUTIONS: None  RED FLAGS: None   WEIGHT BEARING RESTRICTIONS: No  FALLS:  Has patient fallen in last 6 months? No  LIVING ENVIRONMENT: Lives with: lives alone Lives in: House/apartment   OCCUPATION: School psychologist  PLOF: Independent, Independent with basic ADLs, Independent with household mobility without device, Independent with community mobility without device, Independent with homemaking with ambulation, Independent with gait, and Independent with transfers  PATIENT GOALS: She hopes to avoid surgery if possible  NEXT MD VISIT: na  OBJECTIVE:  Note: Objective measures were completed at Evaluation unless otherwise noted.  DIAGNOSTIC FINDINGS:  MRI done at [facility], not available  PATIENT SURVEYS :  FOTO 38, predicted 52  COGNITION: Overall cognitive status: Within functional limits for tasks assessed     SENSATION: WFL  POSTURE: WFL  UPPER EXTREMITY ROM:   Right shoulder flexion: approx 165 degrees Right shoulder abduction : approx 160 degrees Right shoulder ER: approx 75 degrees Right shoulder IR: approx 45 degrees  UPPER EXTREMITY MMT:  Held due to pain but no obvious compensation on fwd flexion or abduction  SHOULDER SPECIAL TESTS: Impingement tests: Painful arc test: positive   Rotator cuff assessment: Drop arm test: negative  Biceps assessment: Speed's test: positive     TODAY'S TREATMENT:  DATE:   04/06/2023 UBE 5 mins (3 forwards/ 2 backwards) - PT present to discuss status Reviewed HEP- required moderate verbal cues; form correction for supine serratus exercise Supine Alphabet x 2 3 way scap stabilization with green loop x 8 each side Ball circles x 20 (clockwise, counter  clock wise, up/down) - up/down caused pain Mini Wall Push Ups 2 x 5 Standing shoulder row Red TB x 20 Standing shoulder extension red TB x 20 Plank at [place] 3 x 20 sec  03/30/23 Initial eval completed and initiated HEP  PATIENT EDUCATION: Education details: Initiated HEP and educated on anatomy of the shoulder and what "full thickness" means and any concerns for putting off surgery.   Person educated: Patient Education method: Explanation, Demonstration, Verbal cues, and Handouts Education comprehension: verbalized understanding, returned demonstration, and verbal cues required  HOME EXERCISE PROGRAM: Access Code: [email or web address] URL: [email or web address] Date: 03/30/2023 Prepared by: Mikey Kirschner  Exercises - Prone Shoulder Extension - Single Arm  - 2 x daily - 7 x weekly - 2 sets - 10 reps - Prone Shoulder Row  - 2 x daily - 7 x weekly - 2 sets - 10 reps - Prone Single Arm Shoulder Horizontal Abduction with Scapular Retraction and Palm Down  - 2 x daily - 7 x weekly - 2 sets - 10 reps - Sidelying Shoulder External Rotation  - 2 x daily - 7 x weekly - 2 sets - 10 reps - Single Arm Serratus Punches in Supine with Dumbbell  - 2 x daily - 7 x weekly - 2 sets - 10 reps - Standing Shoulder Flexion to 90 Degrees with Dumbbells  - 2 x daily - 7 x weekly - 2 sets - 10 reps - Standing Shoulder Scaption  - 2 x daily - 7 x weekly - 2 sets - 10 reps  ASSESSMENT:  CLINICAL IMPRESSION: Today's treatment session focused on shoulder stabilization. Reviewed patient's HEP and patient required moderate verbal cues for form correction and clarification for exercises. Patient experienced occasional sharp pains in her Rt shoulder when moving quickly. Noted increased muscle fatigue with prone shoulder abduction and standing shoulder flexion exercises. Patient experienced increased shoulder pain with up/down motion of ball circles, exercise was discontinued. Educated patient to try to use Rt arm  more when completing house chores. Patient will benefit from skilled PT to address the below impairments and improve overall function.    OBJECTIVE IMPAIRMENTS: decreased ROM, decreased strength, increased fascial restrictions, increased muscle spasms, impaired flexibility, impaired UE functional use, and pain.   ACTIVITY LIMITATIONS: carrying, lifting, sleeping, transfers, bed mobility, bathing, dressing, and hygiene/grooming  PARTICIPATION LIMITATIONS: meal prep, cleaning, laundry, driving, shopping, community activity, occupation, and yard work  PERSONAL FACTORS: Age, Fitness, Profession, and Time since onset of injury/illness/exacerbation are also affecting patient's functional outcome.   REHAB POTENTIAL: Good  CLINICAL DECISION MAKING: Stable/uncomplicated  EVALUATION COMPLEXITY: Low  GOALS: Goals reviewed with patient? Yes  SHORT TERM GOALS: Target date: 04/27/2023   Patient will be independent with initial HEP  Baseline: Goal status: INITIAL  2.  Pain report to be no greater than 4/10  Baseline:  Goal status: INITIAL   LONG TERM GOALS: Target date: 05/25/2023   Patient to be independent with advanced HEP  Baseline:  Goal status: INITIAL  2.  Patient to report pain no greater than 2/10  Baseline:  Goal status: INITIAL  3.  FOTO to be 52 Baseline:  Goal status: INITIAL  4.  Patient will be able  to reach overhead into cabinets and on top of shelves without pain  Baseline:  Goal status: INITIAL  5.  Patient to be able to sleep through the night  Baseline:  Goal status: INITIAL  6.  Patient to report 85% improvement in overall symptoms  Baseline:  Goal status: INITIAL  PLAN: PT FREQUENCY: 2x/week  PT DURATION: 8 weeks  PLANNED INTERVENTIONS: Therapeutic exercises, Therapeutic activity, Neuromuscular re-education, Balance training, Patient/Family education, Self Care, Joint mobilization, Stair training, Aquatic Therapy, Dry Needling, Electrical  stimulation, Spinal mobilization, Cryotherapy, Moist heat, Splintting, Taping, Vasopneumatic device, Traction, Ultrasound, Ionotophoresis 4mg /ml Dexamethasone, Manual therapy, and Re-evaluation  PLAN FOR NEXT SESSION: Assess tolerance to treatment session; Continue shoulder stabilization; progress as tolerate   Claude Manges, PT 04/06/23 5:07 PM  [facility] [address], [address] [place], [place] [address] Phone # [phone] Fax [phone]

## 2023-04-09 ENCOUNTER — Encounter: Payer: Self-pay | Admitting: Physical Therapy

## 2023-04-09 ENCOUNTER — Ambulatory Visit: Payer: BC Managed Care – PPO | Admitting: Physical Therapy

## 2023-04-09 DIAGNOSIS — M6281 Muscle weakness (generalized): Secondary | ICD-10-CM

## 2023-04-09 DIAGNOSIS — R252 Cramp and spasm: Secondary | ICD-10-CM

## 2023-04-09 DIAGNOSIS — M25511 Pain in right shoulder: Secondary | ICD-10-CM

## 2023-04-09 DIAGNOSIS — M25611 Stiffness of right shoulder, not elsewhere classified: Secondary | ICD-10-CM

## 2023-04-09 DIAGNOSIS — M75101 Unspecified rotator cuff tear or rupture of right shoulder, not specified as traumatic: Secondary | ICD-10-CM | POA: Diagnosis not present

## 2023-04-09 NOTE — Therapy (Signed)
OUTPATIENT PHYSICAL THERAPY UPPER EXTREMITY TREATMENT   Patient Name: Brenda Foster MRN: [id number] DOB:02/09/1966, 57 y.o., female Today's Date: 04/09/2023  END OF SESSION:  PT End of Session - 04/09/23 0848     Visit Number 3    Date for PT Re-Evaluation 05/25/23    Authorization Type BCBS    PT Start Time 0805    PT Stop Time 0845    PT Time Calculation (min) 40 min    Activity Tolerance Patient tolerated treatment well    Behavior During Therapy [facility] for tasks assessed/performed               Past Medical History:  Diagnosis Date   Anxiety    Depression    Mild n/ infertility   History of depression 12/22/2012   Treated for about 10 years w/prozac. Stopped about 2 ya.   History of miscarriage    Infertility    Interstitial cystitis    Low back pain    Migraines    Sepsis (HCC) 12/2016   in hospital 1 week   Urinary tract bacterial infections    2x   Urine incontinence    Mild   Past Surgical History:  Procedure Laterality Date   APPENDECTOMY  1980   BREAST SURGERY  1986   Reduction   CERVICAL CONE BIOPSY  1990   CESAREAN SECTION  2003   1 time   COLONOSCOPY  09/30/2017   polyps   DILATION AND CURETTAGE OF UTERUS  1999-2002   HYSTEROSCOPY  2001   LAPAROSCOPY  2001   LASIK     TUBAL LIGATION  2003   WISDOM TOOTH EXTRACTION     Patient Active Problem List   Diagnosis Date Noted   Nontraumatic tear of right supraspinatus tendon 03/22/2023   Tendinopathy of right rotator cuff 03/22/2023   Biceps tendinosis of right shoulder 03/22/2023   Hyperlipidemia LDL goal <100 04/15/2021   FH: heart disease 12/20/2020   Exercise intolerance 10/06/2019   Night sweats 12/15/2017   Lumbar pain 12/15/2017   Bilateral hip pain 12/15/2017   Polyarthralgia 12/15/2017   Tremor 08/27/2017   Situational anxiety 10/26/2014   Stress incontinence in female 10/12/2013    PCP: Natalia Leatherwood, DO   REFERRING PROVIDER: Andi Devon, DO  REFERRING DIAG:  M75.101 (ICD-10-CM) - Nontraumatic tear of right supraspinatus tendon M67.911 (ICD-10-CM) - Tendinopathy of right rotator cuff M67.813 (ICD-10-CM) - Biceps tendinosis of right shoulder  THERAPY DIAG:  Acute pain of right shoulder  Stiffness of right shoulder, not elsewhere classified  Cramp and spasm  Muscle weakness (generalized)  Rationale for Evaluation and Treatment: Rehabilitation  ONSET DATE: 03/22/2023  SUBJECTIVE:  SUBJECTIVE STATEMENT: Patient reports she is doing good today. She had some Rt shoulder soreness after last treatment session. She has been waking up more often through the night with shoulder pain but has found holding a pillow with her Rt arm helps.   Eval: Patient reports injury to right shoulder in May of 2024.  Saw Dr. Dion Saucier who tried cortisone and oral meds.  Patient continued to have pain and Dr. Dion Saucier subsequently suggested an MRI.  MRI revealed "full thickness tear" per patient.  She does not recall any specifics but was told it was full thickness and some minor tears of the other rotator cuff structures.  After MRI, Dr. Dion Saucier suggested surgery but patient wanted second opinion.  Sought 2nd opinion from Dr. Christella Hartigan.  Dr. Christella Hartigan has suggested PT to see if this will resolve or allow her to manage her symptoms.  She has pain with overhead reach or reaching out to the side and some pain in the elbow on the right side.  She is also having difficulty sleeping.  She hopes to avoid surgery if possible.  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  She enjoyed playing pickle ball and doing barre classes but has not been able to do this since her injury.   Hand dominance: Right  PERTINENT HISTORY: na  PAIN:  Are you having pain? Yes: NPRS scale: 2  at rest, 6 with reaching up/10 Pain location: right shoulder Pain description: sharp , aching Aggravating factors: rieaching up Relieving factors: rest, ice, medication  PRECAUTIONS: None  RED FLAGS: None   WEIGHT BEARING RESTRICTIONS: No  FALLS:  Has patient fallen in last 6 months? No  LIVING ENVIRONMENT: Lives with: lives alone Lives in: House/apartment   OCCUPATION: School psychologist  PLOF: Independent, Independent with basic ADLs, Independent with household mobility without device, Independent with community mobility without device, Independent with homemaking with ambulation, Independent with gait, and Independent with transfers  PATIENT GOALS: She hopes to avoid surgery if possible  NEXT MD VISIT: na  OBJECTIVE:  Note: Objective measures were completed at Evaluation unless otherwise noted.  DIAGNOSTIC FINDINGS:  MRI done at [facility], not available  PATIENT SURVEYS :  FOTO 38, predicted 52  COGNITION: Overall cognitive status: Within functional limits for tasks assessed     SENSATION: WFL  POSTURE: WFL  UPPER EXTREMITY ROM:   Right shoulder flexion: approx 165 degrees Right shoulder abduction : approx 160 degrees Right shoulder ER: approx 75 degrees Right shoulder IR: approx 45 degrees  UPPER EXTREMITY MMT:  Held due to pain but no obvious compensation on fwd flexion or abduction  SHOULDER SPECIAL TESTS: Impingement tests: Painful arc test: positive   Rotator cuff assessment: Drop arm test: negative  Biceps assessment: Speed's test: positive     TODAY'S TREATMENT:  DATE:  04/09/2023 Nu Step 5 mins Level 2- PT present to discuss status Quadruped shoulder raises 2 x 10 each Iso Shoulder ER/IR on wall 2 x 10 3 sec hold 3 way scap stabilization with green loop x 10 each side Ball circles x 20  (clockwise, counter clock wise, up/down)  Standing shoulder row Red TB x 20 Standing shoulder extension red TB x 20 Standing Iso shoulder ER yellow TB 2 x 10 ; 4 sec hold Supine Alphabet x 2 Supine serratus punch 2 x 10   04/06/2023 UBE 5 mins (3 forwards/ 2 backwards) - PT present to discuss status Reviewed HEP- required moderate verbal cues; form correction for supine serratus exercise Supine Alphabet x 2 3 way scap stabilization with green loop x 8 each side Ball circles x 20 (clockwise, counter clock wise, up/down) - up/down caused pain Mini Wall Push Ups 2 x 5 Standing shoulder row Red TB x 20 Standing shoulder extension red TB x 20 Plank at [place] 3 x 20 sec  03/30/23 Initial eval completed and initiated HEP  PATIENT EDUCATION: Education details: Initiated HEP and educated on anatomy of the shoulder and what "full thickness" means and any concerns for putting off surgery.   Person educated: Patient Education method: Explanation, Demonstration, Verbal cues, and Handouts Education comprehension: verbalized understanding, returned demonstration, and verbal cues required  HOME EXERCISE PROGRAM: Access Code: [email or web address] URL: [email or web address] Date: 03/30/2023 Prepared by: Mikey Kirschner  Exercises - Prone Shoulder Extension - Single Arm  - 2 x daily - 7 x weekly - 2 sets - 10 reps - Prone Shoulder Row  - 2 x daily - 7 x weekly - 2 sets - 10 reps - Prone Single Arm Shoulder Horizontal Abduction with Scapular Retraction and Palm Down  - 2 x daily - 7 x weekly - 2 sets - 10 reps - Sidelying Shoulder External Rotation  - 2 x daily - 7 x weekly - 2 sets - 10 reps - Single Arm Serratus Punches in Supine with Dumbbell  - 2 x daily - 7 x weekly - 2 sets - 10 reps - Standing Shoulder Flexion to 90 Degrees with Dumbbells  - 2 x daily - 7 x weekly - 2 sets - 10 reps - Standing Shoulder Scaption  - 2 x daily - 7 x weekly - 2 sets - 10 reps  ASSESSMENT:  CLINICAL  IMPRESSION: Today's treatment session focused on shoulder stability. Incorporated exercises that loaded the shoulder joint and patient tolerated exercise well. Noted Rt muscle fatigue after a few repetitions. Ball circles in up and down motions continues to be challenging for patient. She experienced a burning sensation in her Rt bicep that was alleviated when she stopped performing the exercise. Patient experienced discomfort with shoulder ER exercise but when holding position isomerically pain was eliminated. Patient required moderate verbal cues for form correction while performing exercises. Patient will benefit from skilled PT to address the below impairments and improve overall function.     OBJECTIVE IMPAIRMENTS: decreased ROM, decreased strength, increased fascial restrictions, increased muscle spasms, impaired flexibility, impaired UE functional use, and pain.   ACTIVITY LIMITATIONS: carrying, lifting, sleeping, transfers, bed mobility, bathing, dressing, and hygiene/grooming  PARTICIPATION LIMITATIONS: meal prep, cleaning, laundry, driving, shopping, community activity, occupation, and yard work  PERSONAL FACTORS: Age, Fitness, Profession, and Time since onset of injury/illness/exacerbation are also affecting patient's functional outcome.   REHAB POTENTIAL: Good  CLINICAL DECISION MAKING: Stable/uncomplicated  EVALUATION COMPLEXITY: Low  GOALS: Goals reviewed with  patient? Yes  SHORT TERM GOALS: Target date: 04/27/2023   Patient will be independent with initial HEP  Baseline: Goal status: INITIAL  2.  Pain report to be no greater than 4/10  Baseline:  Goal status: INITIAL   LONG TERM GOALS: Target date: 05/25/2023   Patient to be independent with advanced HEP  Baseline:  Goal status: INITIAL  2.  Patient to report pain no greater than 2/10  Baseline:  Goal status: INITIAL  3.  FOTO to be 52 Baseline:  Goal status: INITIAL  4.  Patient will be able to reach  overhead into cabinets and on top of shelves without pain  Baseline:  Goal status: INITIAL  5.  Patient to be able to sleep through the night  Baseline:  Goal status: INITIAL  6.  Patient to report 85% improvement in overall symptoms  Baseline:  Goal status: INITIAL  PLAN: PT FREQUENCY: 2x/week  PT DURATION: 8 weeks  PLANNED INTERVENTIONS: Therapeutic exercises, Therapeutic activity, Neuromuscular re-education, Balance training, Patient/Family education, Self Care, Joint mobilization, Stair training, Aquatic Therapy, Dry Needling, Electrical stimulation, Spinal mobilization, Cryotherapy, Moist heat, Splintting, Taping, Vasopneumatic device, Traction, Ultrasound, Ionotophoresis 4mg /ml Dexamethasone, Manual therapy, and Re-evaluation  PLAN FOR NEXT SESSION: continue shoulder stabilization; supine shoulder flexion & scaption   Claude Manges, PT 04/09/23 8:50 AM  [facility] [address] [place], [place] [address] Phone # [phone] Fax [phone]

## 2023-04-12 ENCOUNTER — Encounter: Payer: Self-pay | Admitting: Physical Therapy

## 2023-04-12 ENCOUNTER — Ambulatory Visit: Payer: BC Managed Care – PPO | Admitting: Physical Therapy

## 2023-04-12 DIAGNOSIS — R252 Cramp and spasm: Secondary | ICD-10-CM

## 2023-04-12 DIAGNOSIS — M25611 Stiffness of right shoulder, not elsewhere classified: Secondary | ICD-10-CM

## 2023-04-12 DIAGNOSIS — M75101 Unspecified rotator cuff tear or rupture of right shoulder, not specified as traumatic: Secondary | ICD-10-CM | POA: Diagnosis not present

## 2023-04-12 DIAGNOSIS — M25511 Pain in right shoulder: Secondary | ICD-10-CM

## 2023-04-12 DIAGNOSIS — M6281 Muscle weakness (generalized): Secondary | ICD-10-CM

## 2023-04-12 NOTE — Therapy (Signed)
OUTPATIENT PHYSICAL THERAPY UPPER EXTREMITY TREATMENT   Patient Name: Brenda Foster MRN: [id number] DOB:Nov 13, 1965, 57 y.o., female Today's Date: 04/12/2023  END OF SESSION:  PT End of Session - 04/12/23 0846     Visit Number 4    Date for PT Re-Evaluation 05/25/23    Authorization Type BCBS    PT Start Time 0805    PT Stop Time 0845    PT Time Calculation (min) 40 min    Activity Tolerance Patient tolerated treatment well    Behavior During Therapy [facility] for tasks assessed/performed                Past Medical History:  Diagnosis Date   Anxiety    Depression    Mild n/ infertility   History of depression 12/22/2012   Treated for about 10 years w/prozac. Stopped about 2 ya.   History of miscarriage    Infertility    Interstitial cystitis    Low back pain    Migraines    Sepsis (HCC) 12/2016   in hospital 1 week   Urinary tract bacterial infections    2x   Urine incontinence    Mild   Past Surgical History:  Procedure Laterality Date   APPENDECTOMY  1980   BREAST SURGERY  1986   Reduction   CERVICAL CONE BIOPSY  1990   CESAREAN SECTION  2003   1 time   COLONOSCOPY  09/30/2017   polyps   DILATION AND CURETTAGE OF UTERUS  1999-2002   HYSTEROSCOPY  2001   LAPAROSCOPY  2001   LASIK     TUBAL LIGATION  2003   WISDOM TOOTH EXTRACTION     Patient Active Problem List   Diagnosis Date Noted   Nontraumatic tear of right supraspinatus tendon 03/22/2023   Tendinopathy of right rotator cuff 03/22/2023   Biceps tendinosis of right shoulder 03/22/2023   Hyperlipidemia LDL goal <100 04/15/2021   FH: heart disease 12/20/2020   Exercise intolerance 10/06/2019   Night sweats 12/15/2017   Lumbar pain 12/15/2017   Bilateral hip pain 12/15/2017   Polyarthralgia 12/15/2017   Tremor 08/27/2017   Situational anxiety 10/26/2014   Stress incontinence in female 10/12/2013    PCP: Natalia Leatherwood, DO   REFERRING PROVIDER: Andi Devon, DO  REFERRING DIAG:  M75.101 (ICD-10-CM) - Nontraumatic tear of right supraspinatus tendon M67.911 (ICD-10-CM) - Tendinopathy of right rotator cuff M67.813 (ICD-10-CM) - Biceps tendinosis of right shoulder  THERAPY DIAG:  Acute pain of right shoulder  Stiffness of right shoulder, not elsewhere classified  Cramp and spasm  Muscle weakness (generalized)  Rationale for Evaluation and Treatment: Rehabilitation  ONSET DATE: 03/22/2023  SUBJECTIVE:  SUBJECTIVE STATEMENT: Patient she has been experiencing more sharp shoulder pains occasionally that is a 6/10 pain. She felt more muscle soreness after last treatment session. Pain is currently 2/10.    Eval: Patient reports injury to right shoulder in May of 2024.  Saw Dr. Dion Saucier who tried cortisone and oral meds.  Patient continued to have pain and Dr. Dion Saucier subsequently suggested an MRI.  MRI revealed "full thickness tear" per patient.  She does not recall any specifics but was told it was full thickness and some minor tears of the other rotator cuff structures.  After MRI, Dr. Dion Saucier suggested surgery but patient wanted second opinion.  Sought 2nd opinion from Dr. Christella Hartigan.  Dr. Christella Hartigan has suggested PT to see if this will resolve or allow her to manage her symptoms.  She has pain with overhead reach or reaching out to the side and some pain in the elbow on the right side.  She is also having difficulty sleeping.  She hopes to avoid surgery if possible.  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  She enjoyed playing pickle ball and doing barre classes but has not been able to do this since her injury.   Hand dominance: Right  PERTINENT HISTORY: na  PAIN:  Are you having pain? Yes: NPRS scale: 2 at rest, 6 with reaching up/10 Pain location:  right shoulder Pain description: sharp , aching Aggravating factors: rieaching up Relieving factors: rest, ice, medication  PRECAUTIONS: None  RED FLAGS: None   WEIGHT BEARING RESTRICTIONS: No  FALLS:  Has patient fallen in last 6 months? No  LIVING ENVIRONMENT: Lives with: lives alone Lives in: House/apartment   OCCUPATION: School psychologist  PLOF: Independent, Independent with basic ADLs, Independent with household mobility without device, Independent with community mobility without device, Independent with homemaking with ambulation, Independent with gait, and Independent with transfers  PATIENT GOALS: She hopes to avoid surgery if possible  NEXT MD VISIT: na  OBJECTIVE:  Note: Objective measures were completed at Evaluation unless otherwise noted.  DIAGNOSTIC FINDINGS:  MRI done at [facility], not available  PATIENT SURVEYS :  FOTO 38, predicted 52  COGNITION: Overall cognitive status: Within functional limits for tasks assessed     SENSATION: WFL  POSTURE: WFL  UPPER EXTREMITY ROM:   Right shoulder flexion: approx 165 degrees Right shoulder abduction : approx 160 degrees Right shoulder ER: approx 75 degrees Right shoulder IR: approx 45 degrees  UPPER EXTREMITY MMT:  Held due to pain but no obvious compensation on fwd flexion or abduction  SHOULDER SPECIAL TESTS: Impingement tests: Painful arc test: positive   Rotator cuff assessment: Drop arm test: negative  Biceps assessment: Speed's test: positive     TODAY'S TREATMENT:  DATE:  04/12/2023 UBE 5 mins (3 forwards/ 2 backwards) - PT present to discuss status Quadruped shoulder raises 2 x 10 each Supine shoulder flexion with dowel 2 x 8 Supine scaption 2 x 8 Supine Alphabet x 2 1# DB Supine serratus punch 2 x 10 1# DB 3 way scap stabilization with  green loop x 10 each side Ball circles x 20 (clockwise, counter clock wise, up/down)  Standing shoulder row Red TB x 20- attempted green TB but patient had increased oain Standing shoulder extension red TB x 20 Attempted wall push up but patient had pain Wall Planks 3 x 30sec   04/09/2023 Nu Step 5 mins Level 2- PT present to discuss status Quadruped shoulder raises 2 x 10 each Iso Shoulder ER/IR on wall 2 x 10 3 sec hold 3 way scap stabilization with green loop x 10 each side Ball circles x 20 (clockwise, counter clock wise, up/down)  Standing shoulder row Red TB x 20 Standing shoulder extension red TB x 20 Standing Iso shoulder ER yellow TB 2 x 10 ; 4 sec hold Supine Alphabet x 2 Supine serratus punch 2 x 10   04/06/2023 UBE 5 mins (3 forwards/ 2 backwards) - PT present to discuss status Reviewed HEP- required moderate verbal cues; form correction for supine serratus exercise Supine Alphabet x 2 3 way scap stabilization with green loop x 8 each side Ball circles x 20 (clockwise, counter clock wise, up/down) - up/down caused pain Mini Wall Push Ups 2 x 5 Standing shoulder row Red TB x 20 Standing shoulder extension red TB x 20 Plank at [employer] 3 x 20 sec   PATIENT EDUCATION: Education details: Initiated HEP and educated on anatomy of the shoulder and what "full thickness" means and any concerns for putting off surgery.   Person educated: Patient Education method: Explanation, Demonstration, Verbal cues, and Handouts Education comprehension: verbalized understanding, returned demonstration, and verbal cues required  HOME EXERCISE PROGRAM: Access Code: [email or web address] URL: [email or web address] Date: 03/30/2023 Prepared by: Mikey Kirschner  Exercises - Prone Shoulder Extension - Single Arm  - 2 x daily - 7 x weekly - 2 sets - 10 reps - Prone Shoulder Row  - 2 x daily - 7 x weekly - 2 sets - 10 reps - Prone Single Arm Shoulder Horizontal Abduction with Scapular  Retraction and Palm Down  - 2 x daily - 7 x weekly - 2 sets - 10 reps - Sidelying Shoulder External Rotation  - 2 x daily - 7 x weekly - 2 sets - 10 reps - Single Arm Serratus Punches in Supine with Dumbbell  - 2 x daily - 7 x weekly - 2 sets - 10 reps - Standing Shoulder Flexion to 90 Degrees with Dumbbells  - 2 x daily - 7 x weekly - 2 sets - 10 reps - Standing Shoulder Scaption  - 2 x daily - 7 x weekly - 2 sets - 10 reps  ASSESSMENT:  CLINICAL IMPRESSION: Today's treatment session focused on shoulder stability. Incorporated overhead shoulder movements in supine and patient experienced minimal increase in shoulder pain. Progressed patient's supine alphabet and serratus punch to include weight and she did not experience any pain. Patient required moderated verbal cues for form correction. Patient will benefit from skilled PT to address the below impairments and improve overall function.     OBJECTIVE IMPAIRMENTS: decreased ROM, decreased strength, increased fascial restrictions, increased muscle spasms, impaired flexibility, impaired UE functional use, and pain.   ACTIVITY LIMITATIONS: carrying,  lifting, sleeping, transfers, bed mobility, bathing, dressing, and hygiene/grooming  PARTICIPATION LIMITATIONS: meal prep, cleaning, laundry, driving, shopping, community activity, occupation, and yard work  PERSONAL FACTORS: Age, Fitness, Profession, and Time since onset of injury/illness/exacerbation are also affecting patient's functional outcome.   REHAB POTENTIAL: Good  CLINICAL DECISION MAKING: Stable/uncomplicated  EVALUATION COMPLEXITY: Low  GOALS: Goals reviewed with patient? Yes  SHORT TERM GOALS: Target date: 04/27/2023   Patient will be independent with initial HEP  Baseline: Goal status: INITIAL  2.  Pain report to be no greater than 4/10  Baseline:  Goal status: INITIAL   LONG TERM GOALS: Target date: 05/25/2023   Patient to be independent with advanced HEP   Baseline:  Goal status: INITIAL  2.  Patient to report pain no greater than 2/10  Baseline:  Goal status: INITIAL  3.  FOTO to be 52 Baseline:  Goal status: INITIAL  4.  Patient will be able to reach overhead into cabinets and on top of shelves without pain  Baseline:  Goal status: INITIAL  5.  Patient to be able to sleep through the night  Baseline:  Goal status: INITIAL  6.  Patient to report 85% improvement in overall symptoms  Baseline:  Goal status: INITIAL  PLAN: PT FREQUENCY: 2x/week  PT DURATION: 8 weeks  PLANNED INTERVENTIONS: Therapeutic exercises, Therapeutic activity, Neuromuscular re-education, Balance training, Patient/Family education, Self Care, Joint mobilization, Stair training, Aquatic Therapy, Dry Needling, Electrical stimulation, Spinal mobilization, Cryotherapy, Moist heat, Splintting, Taping, Vasopneumatic device, Traction, Ultrasound, Ionotophoresis 4mg /ml Dexamethasone, Manual therapy, and Re-evaluation  PLAN FOR NEXT SESSION: incorporated more overhead activities ; wall slides   Claude Manges, PT 04/12/23 8:47 AM  [facility] [address], [address] [place], [place] [address] Phone # [phone] Fax [phone]

## 2023-04-16 ENCOUNTER — Ambulatory Visit: Payer: BC Managed Care – PPO | Admitting: Physical Therapy

## 2023-04-20 ENCOUNTER — Encounter: Payer: Self-pay | Admitting: Physical Therapy

## 2023-04-20 ENCOUNTER — Ambulatory Visit: Payer: BC Managed Care – PPO | Admitting: Physical Therapy

## 2023-04-20 DIAGNOSIS — M6281 Muscle weakness (generalized): Secondary | ICD-10-CM

## 2023-04-20 DIAGNOSIS — M25611 Stiffness of right shoulder, not elsewhere classified: Secondary | ICD-10-CM

## 2023-04-20 DIAGNOSIS — R252 Cramp and spasm: Secondary | ICD-10-CM

## 2023-04-20 DIAGNOSIS — M25511 Pain in right shoulder: Secondary | ICD-10-CM

## 2023-04-20 DIAGNOSIS — M75101 Unspecified rotator cuff tear or rupture of right shoulder, not specified as traumatic: Secondary | ICD-10-CM | POA: Diagnosis not present

## 2023-04-20 NOTE — Therapy (Signed)
OUTPATIENT PHYSICAL THERAPY UPPER EXTREMITY TREATMENT   Patient Name: Brenda Foster MRN: [id number] DOB:1966/06/10, 57 y.o., female Today's Date: 04/20/2023  END OF SESSION:  PT End of Session - 04/20/23 0857     Visit Number 5    Date for PT Re-Evaluation 05/25/23    Authorization Type BCBS    Progress Note Due on Visit 10    PT Start Time 0809    PT Stop Time 0851    PT Time Calculation (min) 42 min    Activity Tolerance Patient tolerated treatment well    Behavior During Therapy [facility] for tasks assessed/performed                 Past Medical History:  Diagnosis Date   Anxiety    Depression    Mild n/ infertility   History of depression 12/22/2012   Treated for about 10 years w/prozac. Stopped about 2 ya.   History of miscarriage    Infertility    Interstitial cystitis    Low back pain    Migraines    Sepsis (HCC) 12/2016   in hospital 1 week   Urinary tract bacterial infections    2x   Urine incontinence    Mild   Past Surgical History:  Procedure Laterality Date   APPENDECTOMY  1980   BREAST SURGERY  1986   Reduction   CERVICAL CONE BIOPSY  1990   CESAREAN SECTION  2003   1 time   COLONOSCOPY  09/30/2017   polyps   DILATION AND CURETTAGE OF UTERUS  1999-2002   HYSTEROSCOPY  2001   LAPAROSCOPY  2001   LASIK     TUBAL LIGATION  2003   WISDOM TOOTH EXTRACTION     Patient Active Problem List   Diagnosis Date Noted   Nontraumatic tear of right supraspinatus tendon 03/22/2023   Tendinopathy of right rotator cuff 03/22/2023   Biceps tendinosis of right shoulder 03/22/2023   Hyperlipidemia LDL goal <100 04/15/2021   FH: heart disease 12/20/2020   Exercise intolerance 10/06/2019   Night sweats 12/15/2017   Lumbar pain 12/15/2017   Bilateral hip pain 12/15/2017   Polyarthralgia 12/15/2017   Tremor 08/27/2017   Situational anxiety 10/26/2014   Stress incontinence in female 10/12/2013    PCP: Natalia Leatherwood, DO   REFERRING PROVIDER:  Andi Devon, DO  REFERRING DIAG: M75.101 (ICD-10-CM) - Nontraumatic tear of right supraspinatus tendon M67.911 (ICD-10-CM) - Tendinopathy of right rotator cuff M67.813 (ICD-10-CM) - Biceps tendinosis of right shoulder  THERAPY DIAG:  Acute pain of right shoulder  Stiffness of right shoulder, not elsewhere classified  Cramp and spasm  Muscle weakness (generalized)  Rationale for Evaluation and Treatment: Rehabilitation  ONSET DATE: 03/22/2023  SUBJECTIVE:  SUBJECTIVE STATEMENT: Patient reports she is doing well today. She has not had any increased pain when doing activities at home. She has been able to reach for a coffee cup in her cabinet without pain. Currently her pain is 0/10.   Eval: Patient reports injury to right shoulder in May of 2024.  Saw Dr. Dion Saucier who tried cortisone and oral meds.  Patient continued to have pain and Dr. Dion Saucier subsequently suggested an MRI.  MRI revealed "full thickness tear" per patient.  She does not recall any specifics but was told it was full thickness and some minor tears of the other rotator cuff structures.  After MRI, Dr. Dion Saucier suggested surgery but patient wanted second opinion.  Sought 2nd opinion from Dr. Christella Hartigan.  Dr. Christella Hartigan has suggested PT to see if this will resolve or allow her to manage her symptoms.  She has pain with overhead reach or reaching out to the side and some pain in the elbow on the right side.  She is also having difficulty sleeping.  She hopes to avoid surgery if possible.  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  She enjoyed playing pickle ball and doing barre classes but has not been able to do this since her injury.   Hand dominance: Right  PERTINENT HISTORY: na  PAIN: 04/20/2023 Are you having  pain? Yes: NPRS scale: 0/10 Pain location: right shoulder Pain description: sharp , aching Aggravating factors: rieaching up Relieving factors: rest, ice, medication  PRECAUTIONS: None  RED FLAGS: None   WEIGHT BEARING RESTRICTIONS: No  FALLS:  Has patient fallen in last 6 months? No  LIVING ENVIRONMENT: Lives with: lives alone Lives in: House/apartment   OCCUPATION: School psychologist  PLOF: Independent, Independent with basic ADLs, Independent with household mobility without device, Independent with community mobility without device, Independent with homemaking with ambulation, Independent with gait, and Independent with transfers  PATIENT GOALS: She hopes to avoid surgery if possible  NEXT MD VISIT: na  OBJECTIVE:  Note: Objective measures were completed at Evaluation unless otherwise noted.  DIAGNOSTIC FINDINGS:  MRI done at [facility], not available  PATIENT SURVEYS :  FOTO 38, predicted 52  COGNITION: Overall cognitive status: Within functional limits for tasks assessed     SENSATION: WFL  POSTURE: WFL  UPPER EXTREMITY ROM:   Right shoulder flexion: approx 165 degrees Right shoulder abduction : approx 160 degrees Right shoulder ER: approx 75 degrees Right shoulder IR: approx 45 degrees  UPPER EXTREMITY MMT:  Held due to pain but no obvious compensation on fwd flexion or abduction  SHOULDER SPECIAL TESTS: Impingement tests: Painful arc test: positive   Rotator cuff assessment: Drop arm test: negative  Biceps assessment: Speed's test: positive     TODAY'S TREATMENT:  DATE:  04/20/2023 UBE 5 mins (3 forwards/ 2 backwards) - PT present to discuss status Quadruped shoulder raises 2 x 10 each Supine shoulder flexion with dowel 2 x 8 Supine scaption 2 x 8 Supine Alphabet x 2 1# DB Supine serratus punch 2 x  10 1# DB 3 way scap stabilization with green loop x 10 each side Ball circles x 20 (clockwise, counter clock wise, up/down)  Standing shoulder row Red TB x 20 Standing shoulder extension red TB x 20 Wall Planks 3 x 30sec Shoulder flexion with iso E with yellow TB (hooked on stair) 2 x 8   04/12/2023 UBE 5 mins (3 forwards/ 2 backwards) - PT present to discuss status Quadruped shoulder raises 2 x 10 each Supine shoulder flexion with dowel 2 x 8 Supine scaption 2 x 8 Supine Alphabet x 2 1# DB Supine serratus punch 2 x 10 1# DB 3 way scap stabilization with green loop x 10 each side Ball circles x 20 (clockwise, counter clock wise, up/down)  Standing shoulder row Red TB x 20 Standing shoulder extension red TB x 20 Wall Planks 3 x 30sec   04/09/2023 Nu Step 5 mins Level 2- PT present to discuss status Quadruped shoulder raises 2 x 10 each Iso Shoulder ER/IR on wall 2 x 10 3 sec hold 3 way scap stabilization with green loop x 10 each side Ball circles x 20 (clockwise, counter clock wise, up/down)  Standing shoulder row Red TB x 20 Standing shoulder extension red TB x 20 Standing Iso shoulder ER yellow TB 2 x 10 ; 4 sec hold Supine Alphabet x 2 Supine serratus punch 2 x 10   PATIENT EDUCATION: Education details: Initiated HEP and educated on anatomy of the shoulder and what "full thickness" means and any concerns for putting off surgery.   Person educated: Patient Education method: Explanation, Demonstration, Verbal cues, and Handouts Education comprehension: verbalized understanding, returned demonstration, and verbal cues required  HOME EXERCISE PROGRAM: Access Code: [email or web address] URL: [email or web address] Date: 03/30/2023 Prepared by: Mikey Kirschner  Exercises - Prone Shoulder Extension - Single Arm  - 2 x daily - 7 x weekly - 2 sets - 10 reps - Prone Shoulder Row  - 2 x daily - 7 x weekly - 2 sets - 10 reps - Prone Single Arm Shoulder Horizontal Abduction  with Scapular Retraction and Palm Down  - 2 x daily - 7 x weekly - 2 sets - 10 reps - Sidelying Shoulder External Rotation  - 2 x daily - 7 x weekly - 2 sets - 10 reps - Single Arm Serratus Punches in Supine with Dumbbell  - 2 x daily - 7 x weekly - 2 sets - 10 reps - Standing Shoulder Flexion to 90 Degrees with Dumbbells  - 2 x daily - 7 x weekly - 2 sets - 10 reps - Standing Shoulder Scaption  - 2 x daily - 7 x weekly - 2 sets - 10 reps  ASSESSMENT:  CLINICAL IMPRESSION: Today's treatment session focused on shoulder stability. Patient verbalized a decrease in shoulder pain when completing household activities. She is able to reach for a coffee mug with her Rt shoulder without increased pain. Patient tolerated treatment session well and did not verbalize any increased pain. Noted improved muscular endurance and less muscle fatigue with exercises. Incorporated isometric overhead shoulder exercise and patient was able to perform exercise without increased pain. Patient required verbal and visual cues for correct exercise performance. Patient will benefit from skilled  PT to address the below impairments and improve overall function.      OBJECTIVE IMPAIRMENTS: decreased ROM, decreased strength, increased fascial restrictions, increased muscle spasms, impaired flexibility, impaired UE functional use, and pain.   ACTIVITY LIMITATIONS: carrying, lifting, sleeping, transfers, bed mobility, bathing, dressing, and hygiene/grooming  PARTICIPATION LIMITATIONS: meal prep, cleaning, laundry, driving, shopping, community activity, occupation, and yard work  PERSONAL FACTORS: Age, Fitness, Profession, and Time since onset of injury/illness/exacerbation are also affecting patient's functional outcome.   REHAB POTENTIAL: Good  CLINICAL DECISION MAKING: Stable/uncomplicated  EVALUATION COMPLEXITY: Low  GOALS: Goals reviewed with patient? Yes  SHORT TERM GOALS: Target date: 04/27/2023   Patient will  be independent with initial HEP  Baseline: Goal status: INITIAL  2.  Pain report to be no greater than 4/10  Baseline:  Goal status: INITIAL   LONG TERM GOALS: Target date: 05/25/2023   Patient to be independent with advanced HEP  Baseline:  Goal status: INITIAL  2.  Patient to report pain no greater than 2/10  Baseline:  Goal status: INITIAL  3.  FOTO to be 52 Baseline:  Goal status: INITIAL  4.  Patient will be able to reach overhead into cabinets and on top of shelves without pain  Baseline:  Goal status: INITIAL  5.  Patient to be able to sleep through the night  Baseline:  Goal status: INITIAL  6.  Patient to report 85% improvement in overall symptoms  Baseline:  Goal status: INITIAL  PLAN: PT FREQUENCY: 2x/week  PT DURATION: 8 weeks  PLANNED INTERVENTIONS: Therapeutic exercises, Therapeutic activity, Neuromuscular re-education, Balance training, Patient/Family education, Self Care, Joint mobilization, Stair training, Aquatic Therapy, Dry Needling, Electrical stimulation, Spinal mobilization, Cryotherapy, Moist heat, Splintting, Taping, Vasopneumatic device, Traction, Ultrasound, Ionotophoresis 4mg /ml Dexamethasone, Manual therapy, and Re-evaluation  PLAN FOR NEXT SESSION: assess tolerance to treatment session; continue progressing shoulder stability   Claude Manges, PT 04/20/23 8:59 AM  [facility] [address], [address] [place], [place] [address] Phone # [phone] Fax [phone]

## 2023-04-26 ENCOUNTER — Other Ambulatory Visit: Payer: Self-pay | Admitting: Family Medicine

## 2023-04-30 ENCOUNTER — Telehealth: Payer: Self-pay | Admitting: Physical Therapy

## 2023-04-30 ENCOUNTER — Ambulatory Visit: Payer: BC Managed Care – PPO | Attending: Family Medicine | Admitting: Physical Therapy

## 2023-04-30 DIAGNOSIS — R252 Cramp and spasm: Secondary | ICD-10-CM | POA: Insufficient documentation

## 2023-04-30 DIAGNOSIS — M25611 Stiffness of right shoulder, not elsewhere classified: Secondary | ICD-10-CM | POA: Insufficient documentation

## 2023-04-30 DIAGNOSIS — M25511 Pain in right shoulder: Secondary | ICD-10-CM | POA: Insufficient documentation

## 2023-04-30 DIAGNOSIS — M6281 Muscle weakness (generalized): Secondary | ICD-10-CM | POA: Insufficient documentation

## 2023-04-30 NOTE — Telephone Encounter (Signed)
Spoke with patient about missed appointment today 04/30/2023 at 8:00 am. Patient said she tried to cancel online and give clinic a call. Confirmed patient's next appointment on 11/12 at 8:00 am.  Claude Manges, PT 04/30/23 8:27 AM

## 2023-05-04 ENCOUNTER — Encounter: Payer: Self-pay | Admitting: Physical Therapy

## 2023-05-04 ENCOUNTER — Ambulatory Visit: Payer: BC Managed Care – PPO | Admitting: Family Medicine

## 2023-05-04 ENCOUNTER — Ambulatory Visit: Payer: BC Managed Care – PPO | Admitting: Physical Therapy

## 2023-05-04 ENCOUNTER — Encounter: Payer: Self-pay | Admitting: Family Medicine

## 2023-05-04 VITALS — BP 120/80 | Ht 66.0 in | Wt 162.0 lb

## 2023-05-04 DIAGNOSIS — M75101 Unspecified rotator cuff tear or rupture of right shoulder, not specified as traumatic: Secondary | ICD-10-CM | POA: Diagnosis not present

## 2023-05-04 DIAGNOSIS — M25611 Stiffness of right shoulder, not elsewhere classified: Secondary | ICD-10-CM

## 2023-05-04 DIAGNOSIS — R252 Cramp and spasm: Secondary | ICD-10-CM | POA: Diagnosis present

## 2023-05-04 DIAGNOSIS — M25511 Pain in right shoulder: Secondary | ICD-10-CM

## 2023-05-04 DIAGNOSIS — M6281 Muscle weakness (generalized): Secondary | ICD-10-CM

## 2023-05-04 NOTE — Therapy (Signed)
OUTPATIENT PHYSICAL THERAPY UPPER EXTREMITY TREATMENT   Patient Name: Brenda Foster MRN: [id number] DOB:[birth year], 57 y.o., female Today's Date: 05/04/2023  END OF SESSION:  PT End of Session - 05/04/23 0846     Visit Number 6    Date for PT Re-Evaluation 05/25/23    Authorization Type [employer]    Progress Note Due on Visit 10    PT Start Time 0804    PT Stop Time 0845    PT Time Calculation (min) 41 min    Activity Tolerance Patient tolerated treatment well    Behavior During Therapy [facility] for tasks assessed/performed                  Past Medical History:  Diagnosis Date   Anxiety    Depression    Mild n/ infertility   History of depression 12/22/2012   Treated for about 10 years w/prozac. Stopped about 2 ya.   History of miscarriage    Infertility    Interstitial cystitis    Low back pain    Migraines    Sepsis (HCC) 12/2016   in hospital 1 week   Urinary tract bacterial infections    2x   Urine incontinence    Mild   Past Surgical History:  Procedure Laterality Date   APPENDECTOMY  1980   BREAST SURGERY  1986   Reduction   CERVICAL CONE BIOPSY  1990   CESAREAN SECTION  2003   1 time   COLONOSCOPY  09/30/2017   polyps   DILATION AND CURETTAGE OF UTERUS  1999-2002   HYSTEROSCOPY  2001   LAPAROSCOPY  2001   LASIK     TUBAL LIGATION  2003   WISDOM TOOTH EXTRACTION     Patient Active Problem List   Diagnosis Date Noted   Nontraumatic tear of right supraspinatus tendon 03/22/2023   Tendinopathy of right rotator cuff 03/22/2023   Biceps tendinosis of right shoulder 03/22/2023   Hyperlipidemia LDL goal <100 04/15/2021   FH: heart disease 12/20/2020   Exercise intolerance 10/06/2019   Night sweats 12/15/2017   Lumbar pain 12/15/2017   Bilateral hip pain 12/15/2017   Polyarthralgia 12/15/2017   Tremor 08/27/2017   Situational anxiety 10/26/2014   Stress incontinence in female 10/12/2013    PCP: Natalia Leatherwood, DO   REFERRING PROVIDER:  Andi Devon, DO  REFERRING DIAG: M75.101 (ICD-10-CM) - Nontraumatic tear of right supraspinatus tendon M67.911 (ICD-10-CM) - Tendinopathy of right rotator cuff M67.813 (ICD-10-CM) - Biceps tendinosis of right shoulder  THERAPY DIAG:  Acute pain of right shoulder  Stiffness of right shoulder, not elsewhere classified  Cramp and spasm  Muscle weakness (generalized)  Rationale for Evaluation and Treatment: Rehabilitation  ONSET DATE: 03/22/2023  SUBJECTIVE:  SUBJECTIVE STATEMENT: Patient reports she is doing well today. She is not currently having any pain. He reports having 75% less pain since starting therapy.    Eval: Patient reports injury to right shoulder in May of 2024.  Saw Dr. Dion Saucier who tried cortisone and oral meds.  Patient continued to have pain and Dr. Dion Saucier subsequently suggested an MRI.  MRI revealed "full thickness tear" per patient.  She does not recall any specifics but was told it was full thickness and some minor tears of the other rotator cuff structures.  After MRI, Dr. Dion Saucier suggested surgery but patient wanted second opinion.  Sought 2nd opinion from Dr. Christella Hartigan.  Dr. Christella Hartigan has suggested PT to see if this will resolve or allow her to manage her symptoms.  She has pain with overhead reach or reaching out to the side and some pain in the elbow on the right side.  She is also having difficulty sleeping.  She hopes to avoid surgery if possible.  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  She enjoyed playing pickle ball and doing barre classes but has not been able to do this since her injury.   Hand dominance: Right  PERTINENT HISTORY: na  PAIN: 04/20/2023 Are you having pain? Yes: NPRS scale: 0/10 Pain location: right shoulder Pain  description: sharp , aching Aggravating factors: rieaching up Relieving factors: rest, ice, medication  PRECAUTIONS: None  RED FLAGS: None   WEIGHT BEARING RESTRICTIONS: No  FALLS:  Has patient fallen in last 6 months? No  LIVING ENVIRONMENT: Lives with: lives alone Lives in: House/apartment   OCCUPATION: School psychologist  PLOF: Independent, Independent with basic ADLs, Independent with household mobility without device, Independent with community mobility without device, Independent with homemaking with ambulation, Independent with gait, and Independent with transfers  PATIENT GOALS: She hopes to avoid surgery if possible  NEXT MD VISIT: na  OBJECTIVE:  Note: Objective measures were completed at Evaluation unless otherwise noted.  DIAGNOSTIC FINDINGS:  MRI done at [facility], not available  PATIENT SURVEYS :  FOTO 38, predicted 52  COGNITION: Overall cognitive status: Within functional limits for tasks assessed     SENSATION: WFL  POSTURE: WFL  UPPER EXTREMITY ROM:   Right shoulder flexion: approx 165 degrees Right shoulder abduction : approx 160 degrees Right shoulder ER: approx 75 degrees Right shoulder IR: approx 45 degrees  UPPER EXTREMITY MMT:  Held due to pain but no obvious compensation on fwd flexion or abduction  SHOULDER SPECIAL TESTS: Impingement tests: Painful arc test: positive   Rotator cuff assessment: Drop arm test: negative  Biceps assessment: Speed's test: positive     TODAY'S TREATMENT:  DATE:  05/04/2023 UBE 7 mins (5 forwards/ 2 backwards) - PT present to discuss status Supine Overhead reach with dowel x 8 no weight; 2 x 8 with 1.5#  Supine shoulder Flexion stabilization (x 12 up/down; x 12 side to side) first set no weight; 2nd set 1 lb weight Supine flexion with Red TB with PT  providing pertubation's 2 x 1.4minute 3 way scap stabilization with blue loop x 8 each side Ball circles x 20 (clockwise, counter clock wise, up/down, medial/lateral) 2# ball Standing shoulder row green TB x 20 Standing shoulder extension green TB x 20 Wall Planks 3 x 30sec Shoulder flexion with iso ER with yellow TB (hooked on stair) 2 x 10 Updated HEP to include postural strengthening exercises  04/20/2023 UBE 5 mins (3 forwards/ 2 backwards) - PT present to discuss status Quadruped shoulder raises 2 x 10 each Supine shoulder flexion with dowel 2 x 8 Supine scaption 2 x 8 Supine Alphabet x 2 1# DB Supine serratus punch 2 x 10 1# DB 3 way scap stabilization with green loop x 10 each side Ball circles x 20 (clockwise, counter clock wise, up/down)  Standing shoulder row Red TB x 20 Standing shoulder extension red TB x 20 Wall Planks 3 x 30sec Shoulder flexion with iso ER with yellow TB (hooked on stair) 2 x 8   04/12/2023 UBE 5 mins (3 forwards/ 2 backwards) - PT present to discuss status Quadruped shoulder raises 2 x 10 each Supine shoulder flexion with dowel 2 x 8 Supine scaption 2 x 8 Supine Alphabet x 2 1# DB Supine serratus punch 2 x 10 1# DB 3 way scap stabilization with green loop x 10 each side Ball circles x 20 (clockwise, counter clock wise, up/down)  Standing shoulder row Red TB x 20 Standing shoulder extension red TB x 20 Wall Planks 3 x 30sec  PATIENT EDUCATION: Education details: Initiated HEP and educated on anatomy of the shoulder and what "full thickness" means and any concerns for putting off surgery.   Person educated: Patient Education method: Explanation, Demonstration, Verbal cues, and Handouts Education comprehension: verbalized understanding, returned demonstration, and verbal cues required  HOME EXERCISE PROGRAM: Access Code: [email or web address] URL: [email or web address] Date: 05/04/2023 Prepared by: Claude Manges  Exercises - Prone Shoulder  Extension - Single Arm  - 2 x daily - 7 x weekly - 2 sets - 10 reps - Prone Shoulder Row  - 2 x daily - 7 x weekly - 2 sets - 10 reps - Prone Single Arm Shoulder Horizontal Abduction with Scapular Retraction and Palm Down  - 2 x daily - 7 x weekly - 2 sets - 10 reps - Sidelying Shoulder External Rotation  - 2 x daily - 7 x weekly - 2 sets - 10 reps - Single Arm Serratus Punches in Supine with Dumbbell  - 2 x daily - 7 x weekly - 2 sets - 10 reps - Standing Shoulder Flexion to 90 Degrees with Dumbbells  - 2 x daily - 7 x weekly - 2 sets - 10 reps - Standing Shoulder Scaption  - 2 x daily - 7 x weekly - 2 sets - 10 reps - Shoulder External Rotation and Scapular Retraction with Resistance  - 2 x daily - 7 x weekly - 2 sets - 10 reps - Shoulder extension with resistance - Neutral  - 2 x daily - 7 x weekly - 2 sets - 10 reps - Standing Shoulder Row with Anchored Resistance  - 2  x daily - 7 x weekly - 2 sets - 10 reps - Standing Shoulder Horizontal Abduction with Resistance  - 1 x daily - 7 x weekly - 2 sets - 10 reps ASSESSMENT:  CLINICAL IMPRESSION: Today's treatment session focused on shoulder stability. Kanasia reports a 75% of reduction of pain since starting physical therapy. She is able to reach and drink her coffee with her Rt arm without any pain. Incorporated more overhead stability this treatment session and patient responded well. She did not experience any increased pain. With iso shoulder flexion with PT providing perturbations noted increased muscle fatigue when PT providing resistance medially. Weighted ball circles were challenging for patient she verbalized mild experiencPatient required verbal and tactile cues for correct exercise performance. Patient will benefit from skilled PT to address the below impairments and improve overall function.       OBJECTIVE IMPAIRMENTS: decreased ROM, decreased strength, increased fascial restrictions, increased muscle spasms, impaired flexibility,  impaired UE functional use, and pain.   ACTIVITY LIMITATIONS: carrying, lifting, sleeping, transfers, bed mobility, bathing, dressing, and hygiene/grooming  PARTICIPATION LIMITATIONS: meal prep, cleaning, laundry, driving, shopping, community activity, occupation, and yard work  PERSONAL FACTORS: Age, Fitness, Profession, and Time since onset of injury/illness/exacerbation are also affecting patient's functional outcome.   REHAB POTENTIAL: Good  CLINICAL DECISION MAKING: Stable/uncomplicated  EVALUATION COMPLEXITY: Low  GOALS: Goals reviewed with patient? Yes  SHORT TERM GOALS: Target date: 04/27/2023   Patient will be independent with initial HEP  Baseline: Goal status: INITIAL  2.  Pain report to be no greater than 4/10  Baseline:  Goal status: INITIAL   LONG TERM GOALS: Target date: 05/25/2023   Patient to be independent with advanced HEP  Baseline:  Goal status: INITIAL  2.  Patient to report pain no greater than 2/10  Baseline:  Goal status: INITIAL  3.  FOTO to be 52 Baseline:  Goal status: INITIAL  4.  Patient will be able to reach overhead into cabinets and on top of shelves without pain  Baseline:  Goal status: INITIAL  5.  Patient to be able to sleep through the night  Baseline:  Goal status: INITIAL  6.  Patient to report 85% improvement in overall symptoms  Baseline:  Goal status: INITIAL  PLAN: PT FREQUENCY: 2x/week  PT DURATION: 8 weeks  PLANNED INTERVENTIONS: Therapeutic exercises, Therapeutic activity, Neuromuscular re-education, Balance training, Patient/Family education, Self Care, Joint mobilization, Stair training, Aquatic Therapy, Dry Needling, Electrical stimulation, Spinal mobilization, Cryotherapy, Moist heat, Splintting, Taping, Vasopneumatic device, Traction, Ultrasound, Ionotophoresis 4mg /ml Dexamethasone, Manual therapy, and Re-evaluation  PLAN FOR NEXT SESSION:continue shoulder stability and overhead strengthening    Claude Manges, PT 05/04/23 8:46 AM [facility] [address], [address] [place], [place] [address] Phone # [phone] Fax [phone]

## 2023-05-04 NOTE — Assessment & Plan Note (Signed)
Prior MRI showing near full-thickness supraspinatus tear.  She was recommended surgery, but wanted to try ongoing conservative therapy.  Has had dramatic improvement after only 5 sessions of PT.  Still having some pain, but functional status has improved greatly  Plan: -Continue with physical therapy.  She will reach out if she needs an updated or new referral

## 2023-05-04 NOTE — Progress Notes (Signed)
DATE OF VISIT: 05/04/2023        Brenda Foster DOB: [birth year] MRN: [id number]  CC:  f/u Rt shoulder RTC tear  History of present Illness: Brenda Foster is a 57 y.o. female who presents for a follow-up visit for Rt shoulder RTC tear Last seen by me 03/22/23 Has seen Dr Dion Saucier who recommended surgery - did not try PT - wanted to try conservative care Referred to PT after last visit - completed 5 sessions of PT - last 04/20/23  Today feels much improved Still with occasional pain with certain activities, but is now able to drink a cup of coffee without any issues, able to do other household chores such as emptying the dishwasher without any issues.  Also able to wash her hair without issues She is extremely pleased with her progress and would like to cont with PT  Medications:  Outpatient Encounter Medications as of 05/04/2023  Medication Sig   atorvastatin (LIPITOR) 10 MG tablet Take 1 tablet (10 mg total) by mouth every evening.   diclofenac (VOLTAREN) 75 MG EC tablet Take 1 tablet (75 mg total) by mouth 2 (two) times daily.   FLUoxetine (PROZAC) 20 MG tablet Take 2 tablets (40 mg total) by mouth daily.   LORazepam (ATIVAN) 0.5 MG tablet Take 1 tablet (0.5 mg total) by mouth every 8 (eight) hours as needed for anxiety.   Multiple Vitamin (MULTIVITAMIN) tablet Take 1 tablet by mouth daily.   propranolol (INDERAL) 10 MG tablet Take 1 tablet (10 mg total) by mouth 2 (two) times daily. Needs appointment for additional refills.   No facility-administered encounter medications on file as of 05/04/2023.    Allergies: is allergic to heparin and sulfa antibiotics.  Physical Examination: Vitals: BP 120/80   Ht 5\' 6"  (1.676 m)   Wt 162 lb (73.5 kg)   LMP 01/20/2016 (Approximate)   BMI 26.15 kg/m  GENERAL:  Brenda Foster is a 57 y.o. female appearing their stated age, alert and oriented x 3, in no apparent distress.  SKIN: no rashes or lesions, skin clean, dry, intact MSK:  Right shoulder without any gross deformity.  No tenderness to palpation over the bicipital groove or greater tuberosity.  No tenderness at the [facility] joint.  Has near full range of motion in all planes without any pain.  Mildly positive Neer, positive Hawkins, positive empty can, negative speeds, negative drop arm.  Rotator cuff strength 4/5 with abduction, otherwise 5 -/5 throughout. Left shoulder with full range of motion without pain, weakness, instability Neurovascularly intact distally  Assessment & Plan Nontraumatic tear of right supraspinatus tendon Prior MRI showing near full-thickness supraspinatus tear.  She was recommended surgery, but wanted to try ongoing conservative therapy.  Has had dramatic improvement after only 5 sessions of PT.  Still having some pain, but functional status has improved greatly  Plan: -Continue with physical therapy.  She will reach out if she needs an updated or new referral -Continue her home exercises as she is doing -Can follow-up in 6 to 8 weeks if no improvement, otherwise as needed.  If for some reason her symptoms are worsening would plan to have her follow-up with Dr. Dion Saucier for consideration of rotator cuff repair  Patient expressed understanding & agreement with above.  Encounter Diagnosis  Name Primary?   Nontraumatic tear of right supraspinatus tendon Yes    No orders of the defined types were placed in this encounter.

## 2023-05-07 ENCOUNTER — Encounter: Payer: Self-pay | Admitting: Physical Therapy

## 2023-05-07 ENCOUNTER — Ambulatory Visit: Payer: BC Managed Care – PPO | Admitting: Physical Therapy

## 2023-05-07 DIAGNOSIS — M6281 Muscle weakness (generalized): Secondary | ICD-10-CM

## 2023-05-07 DIAGNOSIS — R252 Cramp and spasm: Secondary | ICD-10-CM

## 2023-05-07 DIAGNOSIS — M25511 Pain in right shoulder: Secondary | ICD-10-CM

## 2023-05-07 DIAGNOSIS — M25611 Stiffness of right shoulder, not elsewhere classified: Secondary | ICD-10-CM

## 2023-05-07 NOTE — Therapy (Signed)
OUTPATIENT PHYSICAL THERAPY UPPER EXTREMITY TREATMENT   Patient Name: Brenda Foster MRN: [id number] DOB:April 15, 1966, 57 y.o., female Today's Date: 05/07/2023  END OF SESSION:  PT End of Session - 05/07/23 0845     Visit Number 7    Date for PT Re-Evaluation 05/25/23    Authorization Type BCBS    Progress Note Due on Visit 10    PT Start Time 0804    PT Stop Time 0845    PT Time Calculation (min) 41 min    Activity Tolerance Patient tolerated treatment well    Behavior During Therapy [facility] for tasks assessed/performed                   Past Medical History:  Diagnosis Date   Anxiety    Depression    Mild n/ infertility   History of depression 12/22/2012   Treated for about 10 years w/prozac. Stopped about 2 ya.   History of miscarriage    Infertility    Interstitial cystitis    Low back pain    Migraines    Sepsis (HCC) 12/2016   in hospital 1 week   Urinary tract bacterial infections    2x   Urine incontinence    Mild   Past Surgical History:  Procedure Laterality Date   APPENDECTOMY  1980   BREAST SURGERY  1986   Reduction   CERVICAL CONE BIOPSY  1990   CESAREAN SECTION  2003   1 time   COLONOSCOPY  09/30/2017   polyps   DILATION AND CURETTAGE OF UTERUS  1999-2002   HYSTEROSCOPY  2001   LAPAROSCOPY  2001   LASIK     TUBAL LIGATION  2003   WISDOM TOOTH EXTRACTION     Patient Active Problem List   Diagnosis Date Noted   Nontraumatic tear of right supraspinatus tendon 03/22/2023   Tendinopathy of right rotator cuff 03/22/2023   Biceps tendinosis of right shoulder 03/22/2023   Hyperlipidemia LDL goal <100 04/15/2021   FH: heart disease 12/20/2020   Exercise intolerance 10/06/2019   Night sweats 12/15/2017   Lumbar pain 12/15/2017   Bilateral hip pain 12/15/2017   Polyarthralgia 12/15/2017   Tremor 08/27/2017   Situational anxiety 10/26/2014   Stress incontinence in female 10/12/2013    PCP: Natalia Leatherwood, DO   REFERRING  PROVIDER: Andi Devon, DO  REFERRING DIAG: M75.101 (ICD-10-CM) - Nontraumatic tear of right supraspinatus tendon M67.911 (ICD-10-CM) - Tendinopathy of right rotator cuff M67.813 (ICD-10-CM) - Biceps tendinosis of right shoulder  THERAPY DIAG:  Acute pain of right shoulder  Stiffness of right shoulder, not elsewhere classified  Cramp and spasm  Muscle weakness (generalized)  Rationale for Evaluation and Treatment: Rehabilitation  ONSET DATE: 03/22/2023  SUBJECTIVE:  SUBJECTIVE STATEMENT: Patient reports she is having increased pain today. She is not sure if it was because she lifted a case of water at the grocery store or the new exercises we did in therapy. With resting her pain is 1-2/10 and with movement her pain increases.   Eval: Patient reports injury to right shoulder in May of 2024.  Saw Dr. Dion Saucier who tried cortisone and oral meds.  Patient continued to have pain and Dr. Dion Saucier subsequently suggested an MRI.  MRI revealed "full thickness tear" per patient.  She does not recall any specifics but was told it was full thickness and some minor tears of the other rotator cuff structures.  After MRI, Dr. Dion Saucier suggested surgery but patient wanted second opinion.  Sought 2nd opinion from Dr. Christella Hartigan.  Dr. Christella Hartigan has suggested PT to see if this will resolve or allow her to manage her symptoms.  She has pain with overhead reach or reaching out to the side and some pain in the elbow on the right side.  She is also having difficulty sleeping.  She hopes to avoid surgery if possible.  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  She enjoyed playing pickle ball and doing barre classes but has not been able to do this since her injury.   Hand dominance: Right  PERTINENT  HISTORY: na  PAIN: 05/07/2023 Are you having pain? Yes: NPRS scale: 1-2/10 Pain location: right shoulder Pain description: sharp , aching Aggravating factors: rieaching up Relieving factors: rest, ice, medication  PRECAUTIONS: None  RED FLAGS: None   WEIGHT BEARING RESTRICTIONS: No  FALLS:  Has patient fallen in last 6 months? No  LIVING ENVIRONMENT: Lives with: lives alone Lives in: House/apartment   OCCUPATION: School psychologist  PLOF: Independent, Independent with basic ADLs, Independent with household mobility without device, Independent with community mobility without device, Independent with homemaking with ambulation, Independent with gait, and Independent with transfers  PATIENT GOALS: She hopes to avoid surgery if possible  NEXT MD VISIT: na  OBJECTIVE:  Note: Objective measures were completed at Evaluation unless otherwise noted.  DIAGNOSTIC FINDINGS:  MRI done at [facility], not available  PATIENT SURVEYS :  FOTO 38, predicted 52  COGNITION: Overall cognitive status: Within functional limits for tasks assessed     SENSATION: WFL  POSTURE: WFL  UPPER EXTREMITY ROM:   Right shoulder flexion: approx 165 degrees Right shoulder abduction : approx 160 degrees Right shoulder ER: approx 75 degrees Right shoulder IR: approx 45 degrees  UPPER EXTREMITY MMT:  Held due to pain but no obvious compensation on fwd flexion or abduction  SHOULDER SPECIAL TESTS: Impingement tests: Painful arc test: positive   Rotator cuff assessment: Drop arm test: negative  Biceps assessment: Speed's test: positive     TODAY'S TREATMENT:  DATE:  05/07/2023 UBE 5 mins (forwards only- backwards increased her pain) - PT present to discuss status Supine Overhead reach with dowel 2 x 10 Supine Serratus Punches 2# 2 x 10  Supine  shoulder Flexion stabilization (x 12 up/down; x 12 side to side) first set no weight Attempted Sleeper stretch but patient had increased pain Manual: STM to Rt biceps Standing biceps & pec stretch 2 x 30 sec Standing shoulder row green TB x 20 Standing shoulder extension green TB x 20 Standing shoulder ER with green TB x 20 Ball roll ups on wall x 10 - patient had increased shoulder pain Shoulder Isometrics at wall (ER, IR ,flexion, extension ) x 10 each 4 sec holds   05/04/2023 UBE 7 mins (5 forwards/ 2 backwards) - PT present to discuss status Supine Overhead reach with dowel x 8 no weight; 2 x 8 with 1.5#  Supine shoulder Flexion stabilization (x 12 up/down; x 12 side to side) first set no weight; 2nd set 1 lb weight Supine flexion with Red TB with PT providing pertubation's 2 x 1.37minute 3 way scap stabilization with blue loop x 8 each side Ball circles x 20 (clockwise, counter clock wise, up/down, medial/lateral) 2# ball Standing shoulder row green TB x 20 Standing shoulder extension green TB x 20 Wall Planks 3 x 30sec Shoulder flexion with iso ER with yellow TB (hooked on stair) 2 x 10 Updated HEP to include postural strengthening exercises  04/20/2023 UBE 5 mins (3 forwards/ 2 backwards) - PT present to discuss status Quadruped shoulder raises 2 x 10 each Supine shoulder flexion with dowel 2 x 8 Supine scaption 2 x 8 Supine Alphabet x 2 1# DB Supine serratus punch 2 x 10 1# DB 3 way scap stabilization with green loop x 10 each side Ball circles x 20 (clockwise, counter clock wise, up/down)  Standing shoulder row Red TB x 20 Standing shoulder extension red TB x 20 Wall Planks 3 x 30sec Shoulder flexion with iso ER with yellow TB (hooked on stair) 2 x 8   PATIENT EDUCATION: Education details: Initiated HEP and educated on anatomy of the shoulder and what "full thickness" means and any concerns for putting off surgery.   Person educated: Patient Education method:  Explanation, Demonstration, Verbal cues, and Handouts Education comprehension: verbalized understanding, returned demonstration, and verbal cues required  HOME EXERCISE PROGRAM: Access Code: [email or web address] URL: [email or web address] Date: 05/04/2023 Prepared by: Claude Manges  Exercises - Prone Shoulder Extension - Single Arm  - 2 x daily - 7 x weekly - 2 sets - 10 reps - Prone Shoulder Row  - 2 x daily - 7 x weekly - 2 sets - 10 reps - Prone Single Arm Shoulder Horizontal Abduction with Scapular Retraction and Palm Down  - 2 x daily - 7 x weekly - 2 sets - 10 reps - Sidelying Shoulder External Rotation  - 2 x daily - 7 x weekly - 2 sets - 10 reps - Single Arm Serratus Punches in Supine with Dumbbell  - 2 x daily - 7 x weekly - 2 sets - 10 reps - Standing Shoulder Flexion to 90 Degrees with Dumbbells  - 2 x daily - 7 x weekly - 2 sets - 10 reps - Standing Shoulder Scaption  - 2 x daily - 7 x weekly - 2 sets - 10 reps - Shoulder External Rotation and Scapular Retraction with Resistance  - 2 x daily - 7 x weekly - 2 sets - 10  reps - Shoulder extension with resistance - Neutral  - 2 x daily - 7 x weekly - 2 sets - 10 reps - Standing Shoulder Row with Anchored Resistance  - 2 x daily - 7 x weekly - 2 sets - 10 reps - Standing Shoulder Horizontal Abduction with Resistance  - 1 x daily - 7 x weekly - 2 sets - 10 reps ASSESSMENT:  CLINICAL IMPRESSION: Oyinkansola presented to therapy more painful than normal. Since last treatment session patient carried a case of water and she felt increased pain in her Rt shoulder. Patient is unsure if the increase in pain was due to carrying the case of water or progression of exercises in therapy. In today's treatment session patient experienced increased Rt shoulder pain with exercises she normally does not have pain with. Attempted sleeper stretch and ball roll ups on wall and patient had increased Rt shoulder pain with both exercises. Patient tolerated  shoulder isometric exercises well and did not verbalized any increased pain. Patient will benefit from skilled PT to address the below impairments and improve overall function.        OBJECTIVE IMPAIRMENTS: decreased ROM, decreased strength, increased fascial restrictions, increased muscle spasms, impaired flexibility, impaired UE functional use, and pain.   ACTIVITY LIMITATIONS: carrying, lifting, sleeping, transfers, bed mobility, bathing, dressing, and hygiene/grooming  PARTICIPATION LIMITATIONS: meal prep, cleaning, laundry, driving, shopping, community activity, occupation, and yard work  PERSONAL FACTORS: Age, Fitness, Profession, and Time since onset of injury/illness/exacerbation are also affecting patient's functional outcome.   REHAB POTENTIAL: Good  CLINICAL DECISION MAKING: Stable/uncomplicated  EVALUATION COMPLEXITY: Low  GOALS: Goals reviewed with patient? Yes  SHORT TERM GOALS: Target date: 04/27/2023   Patient will be independent with initial HEP  Baseline: Goal status: INITIAL  2.  Pain report to be no greater than 4/10  Baseline:  Goal status: INITIAL   LONG TERM GOALS: Target date: 05/25/2023   Patient to be independent with advanced HEP  Baseline:  Goal status: INITIAL  2.  Patient to report pain no greater than 2/10  Baseline:  Goal status: INITIAL  3.  FOTO to be 52 Baseline:  Goal status: INITIAL  4.  Patient will be able to reach overhead into cabinets and on top of shelves without pain  Baseline:  Goal status: INITIAL  5.  Patient to be able to sleep through the night  Baseline:  Goal status: INITIAL  6.  Patient to report 85% improvement in overall symptoms  Baseline:  Goal status: INITIAL  PLAN: PT FREQUENCY: 2x/week  PT DURATION: 8 weeks  PLANNED INTERVENTIONS: Therapeutic exercises, Therapeutic activity, Neuromuscular re-education, Balance training, Patient/Family education, Self Care, Joint mobilization, Stair training,  Aquatic Therapy, Dry Needling, Electrical stimulation, Spinal mobilization, Cryotherapy, Moist heat, Splintting, Taping, Vasopneumatic device, Traction, Ultrasound, Ionotophoresis 4mg /ml Dexamethasone, Manual therapy, and Re-evaluation  PLAN FOR NEXT SESSION:Assess pain levels; progress shoulder stability exercises as tolerated   Claude Manges, PT 05/07/23 8:48 AM [facility] [address] [place], [place] [address] Phone # [phone] Fax [phone]

## 2023-05-18 ENCOUNTER — Ambulatory Visit: Payer: BC Managed Care – PPO | Admitting: Physical Therapy

## 2023-05-18 ENCOUNTER — Encounter: Payer: Self-pay | Admitting: Physical Therapy

## 2023-05-18 DIAGNOSIS — M25511 Pain in right shoulder: Secondary | ICD-10-CM | POA: Diagnosis not present

## 2023-05-18 DIAGNOSIS — M6281 Muscle weakness (generalized): Secondary | ICD-10-CM

## 2023-05-18 DIAGNOSIS — M25611 Stiffness of right shoulder, not elsewhere classified: Secondary | ICD-10-CM

## 2023-05-18 DIAGNOSIS — R252 Cramp and spasm: Secondary | ICD-10-CM

## 2023-05-18 NOTE — Therapy (Addendum)
OUTPATIENT PHYSICAL THERAPY UPPER EXTREMITY TREATMENT / RE- CERTIFICATION / DISCHARGE NOTE   Patient Name: Brenda Foster MRN: [id number] DOB:22-Aug-1965, 57 y.o., female Today's Date: 05/18/2023  END OF SESSION:  PT End of Session - 05/18/23 1539     Visit Number 8    Date for PT Re-Evaluation 06/22/23    Authorization Type BCBS    Progress Note Due on Visit 10    PT Start Time 1447    PT Stop Time 1530    PT Time Calculation (min) 43 min    Activity Tolerance Patient tolerated treatment well    Behavior During Therapy WFL for tasks assessed/performed                    Past Medical History:  Diagnosis Date   Anxiety    Depression    Mild n/ infertility   History of depression 12/22/2012   Treated for about 10 years w/prozac. Stopped about 2 ya.   History of miscarriage    Infertility    Interstitial cystitis    Low back pain    Migraines    Sepsis (HCC) 12/2016   in hospital 1 week   Urinary tract bacterial infections    2x   Urine incontinence    Mild   Past Surgical History:  Procedure Laterality Date   APPENDECTOMY  1980   BREAST SURGERY  1986   Reduction   CERVICAL CONE BIOPSY  1990   CESAREAN SECTION  2003   1 time   COLONOSCOPY  09/30/2017   polyps   DILATION AND CURETTAGE OF UTERUS  1999-2002   HYSTEROSCOPY  2001   LAPAROSCOPY  2001   LASIK     TUBAL LIGATION  2003   WISDOM TOOTH EXTRACTION     Patient Active Problem List   Diagnosis Date Noted   Nontraumatic tear of right supraspinatus tendon 03/22/2023   Tendinopathy of right rotator cuff 03/22/2023   Biceps tendinosis of right shoulder 03/22/2023   Hyperlipidemia LDL goal <100 04/15/2021   FH: heart disease 12/20/2020   Exercise intolerance 10/06/2019   Night sweats 12/15/2017   Lumbar pain 12/15/2017   Bilateral hip pain 12/15/2017   Polyarthralgia 12/15/2017   Tremor 08/27/2017   Situational anxiety 10/26/2014   Stress incontinence in female 10/12/2013    PCP:  Natalia Leatherwood, DO   REFERRING PROVIDER: Andi Devon, DO  REFERRING DIAG: M75.101 (ICD-10-CM) - Nontraumatic tear of right supraspinatus tendon M67.911 (ICD-10-CM) - Tendinopathy of right rotator cuff M67.813 (ICD-10-CM) - Biceps tendinosis of right shoulder  THERAPY DIAG:  Acute pain of right shoulder  Stiffness of right shoulder, not elsewhere classified  Cramp and spasm  Muscle weakness (generalized)  Rationale for Evaluation and Treatment: Rehabilitation  ONSET DATE: 03/22/2023  SUBJECTIVE:  SUBJECTIVE STATEMENT: Patient reports she is doing a lot better today. Her shoulder is back to were it was two sessions ago.   Eval: Patient reports injury to right shoulder in May of 2024.  Saw Dr. Dion Saucier who tried cortisone and oral meds.  Patient continued to have pain and Dr. Dion Saucier subsequently suggested an MRI.  MRI revealed "full thickness tear" per patient.  She does not recall any specifics but was told it was full thickness and some minor tears of the other rotator cuff structures.  After MRI, Dr. Dion Saucier suggested surgery but patient wanted second opinion.  Sought 2nd opinion from Dr. Christella Hartigan.  Dr. Christella Hartigan has suggested PT to see if this will resolve or allow her to manage her symptoms.  She has pain with overhead reach or reaching out to the side and some pain in the elbow on the right side.  She is also having difficulty sleeping.  She hopes to avoid surgery if possible.  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  She enjoyed playing pickle ball and doing barre classes but has not been able to do this since her injury.   Hand dominance: Right  PERTINENT HISTORY: na  PAIN: 05/18/2023 Are you having pain? Yes: NPRS scale: 0/10 Pain location: right  shoulder Pain description: sharp , aching Aggravating factors: rieaching up Relieving factors: rest, ice, medication  PRECAUTIONS: None  RED FLAGS: None   WEIGHT BEARING RESTRICTIONS: No  FALLS:  Has patient fallen in last 6 months? No  LIVING ENVIRONMENT: Lives with: lives alone Lives in: House/apartment   OCCUPATION: School psychologist  PLOF: Independent, Independent with basic ADLs, Independent with household mobility without device, Independent with community mobility without device, Independent with homemaking with ambulation, Independent with gait, and Independent with transfers  PATIENT GOALS: She hopes to avoid surgery if possible  NEXT MD VISIT: na  OBJECTIVE:  Note: Objective measures were completed at Evaluation unless otherwise noted.  DIAGNOSTIC FINDINGS:  MRI done at [facility], not available  PATIENT SURVEYS :  FOTO 38, predicted 52  FOTO 05/18/2023: 54 COGNITION: Overall cognitive status: Within functional limits for tasks assessed     SENSATION: WFL  POSTURE: WFL  UPPER EXTREMITY ROM:   Right shoulder flexion: approx 165 degrees Right shoulder abduction : approx 160 degrees Right shoulder ER: approx 75 degrees Right shoulder IR: approx 45 degrees  UPPER EXTREMITY MMT:  Held due to pain but no obvious compensation on fwd flexion or abduction  SHOULDER SPECIAL TESTS: Impingement tests: Painful arc test: positive   Rotator cuff assessment: Drop arm test: negative  Biceps assessment: Speed's test: positive     TODAY'S TREATMENT:                                                                                                                                         DATE:  05/18/2023 UBE 6 mins Level 1.6 (3 forwards 3 backwards ) - PT present to discuss status Prone Shoulder extension & rows 2# DB Rt only 2 x 10 Prone shoulder abduction 1# 2 x 10 Supine Serratus Punches 2# 2 x 10  Side-lying shoulder ER 2# 2 x 10 Rt only Standing  shoulder row green TB x 20 Shoulder flexion with iso ER with yellow TB (hooked on stair) 2 x 8 4 D with blue ply ball x 20 each direction 3 way scap stabilization with blue loop x 8 each side FOTO :54  05/07/2023 UBE 5 mins (forwards only- backwards increased her pain) - PT present to discuss status Supine Overhead reach with dowel 2 x 10 Supine Serratus Punches 2# 2 x 10  Supine shoulder Flexion stabilization (x 12 up/down; x 12 side to side) first set no weight Attempted Sleeper stretch but patient had increased pain Manual: STM to Rt biceps Standing biceps & pec stretch 2 x 30 sec Standing shoulder row green TB x 20 Standing shoulder extension green TB x 20 Standing shoulder ER with green TB x 20 Ball roll ups on wall x 10 - patient had increased shoulder pain Shoulder Isometrics at wall (ER, IR ,flexion, extension ) x 10 each 4 sec holds   05/04/2023 UBE 7 mins (5 forwards/ 2 backwards) - PT present to discuss status Supine Overhead reach with dowel x 8 no weight; 2 x 8 with 1.5#  Supine shoulder Flexion stabilization (x 12 up/down; x 12 side to side) first set no weight; 2nd set 1 lb weight Supine flexion with Red TB with PT providing pertubation's 2 x 1.49minute 3 way scap stabilization with blue loop x 8 each side Ball circles x 20 (clockwise, counter clock wise, up/down, medial/lateral) 2# ball Standing shoulder row green TB x 20 Standing shoulder extension green TB x 20 Wall Planks 3 x 30sec Shoulder flexion with iso ER with yellow TB (hooked on stair) 2 x 10 Updated HEP to include postural strengthening exercises     PATIENT EDUCATION: Education details: Initiated HEP and educated on anatomy of the shoulder and what "full thickness" means and any concerns for putting off surgery.   Person educated: Patient Education method: Explanation, Demonstration, Verbal cues, and Handouts Education comprehension: verbalized understanding, returned demonstration, and verbal cues  required  HOME EXERCISE PROGRAM: Access Code: [email or web address] URL: [email or web address] Date: 05/04/2023 Prepared by: Claude Manges  Exercises - Prone Shoulder Extension - Single Arm  - 2 x daily - 7 x weekly - 2 sets - 10 reps - Prone Shoulder Row  - 2 x daily - 7 x weekly - 2 sets - 10 reps - Prone Single Arm Shoulder Horizontal Abduction with Scapular Retraction and Palm Down  - 2 x daily - 7 x weekly - 2 sets - 10 reps - Sidelying Shoulder External Rotation  - 2 x daily - 7 x weekly - 2 sets - 10 reps - Single Arm Serratus Punches in Supine with Dumbbell  - 2 x daily - 7 x weekly - 2 sets - 10 reps - Standing Shoulder Flexion to 90 Degrees with Dumbbells  - 2 x daily - 7 x weekly - 2 sets - 10 reps - Standing Shoulder Scaption  - 2 x daily - 7 x weekly - 2 sets - 10 reps - Shoulder External Rotation and Scapular Retraction with Resistance  - 2 x daily - 7 x weekly - 2 sets - 10 reps - Shoulder extension with  resistance - Neutral  - 2 x daily - 7 x weekly - 2 sets - 10 reps - Standing Shoulder Row with Anchored Resistance  - 2 x daily - 7 x weekly - 2 sets - 10 reps - Standing Shoulder Horizontal Abduction with Resistance  - 1 x daily - 7 x weekly - 2 sets - 10 reps ASSESSMENT:  CLINICAL IMPRESSION: Breyonna verbalized feeling 60% better since starting therapy. She is now able to reach in her cabinets without increased pain. Drying her hair with a blow dryer, putting on a bra, and flushing the toilet is still challenging for patient. When doing these activities she experiences 4/10 shooting pain the subsides when she stops the activity. Brendi is compliant with her home exercises and motivated to get better. Patient would continue to benefit from continued therapy to address remaining goals and functional limitations. Patient to continue therapy 1x week for 5 more weeks. Patient is in agreement with current plan.       OBJECTIVE IMPAIRMENTS: decreased ROM, decreased strength,  increased fascial restrictions, increased muscle spasms, impaired flexibility, impaired UE functional use, and pain.   ACTIVITY LIMITATIONS: carrying, lifting, sleeping, transfers, bed mobility, bathing, dressing, and hygiene/grooming  PARTICIPATION LIMITATIONS: meal prep, cleaning, laundry, driving, shopping, community activity, occupation, and yard work  PERSONAL FACTORS: Age, Fitness, Profession, and Time since onset of injury/illness/exacerbation are also affecting patient's functional outcome.   REHAB POTENTIAL: Good  CLINICAL DECISION MAKING: Stable/uncomplicated  EVALUATION COMPLEXITY: Low  GOALS: Goals reviewed with patient? Yes  SHORT TERM GOALS: Target date: 04/27/2023   Patient will be independent with initial HEP  Baseline: Goal status: MET 05/18/2023  2.  Pain report to be no greater than 4/10  Baseline:  Goal status: MET 05/18/2023   LONG TERM GOALS: Target date: 06/22/2023   Patient to be independent with advanced HEP  Baseline:  Goal status: IN PROGRESS 05/18/2023  2.  Patient to report pain no greater than 2/10  Baseline:  Goal status: IN PROGRESS 05/18/2023  3.  FOTO to be 62 Baseline: 54 05/18/2023 Goal status: NEW  4.  Patient will be able to reach overhead into cabinets and on top of shelves without pain  Baseline:  Goal status: MET 05/18/2023  5.  Patient to be able to sleep through the night  Baseline:  Goal status: IN PROGRESS 05/18/2023  6.  Patient to report 85% improvement in overall symptoms  Baseline: 60% Goal status: IN PROGRESS 05/18/2023  PLAN: PT FREQUENCY: 1x/week  PT DURATION: other: 5 weeks  PLANNED INTERVENTIONS: Therapeutic exercises, Therapeutic activity, Neuromuscular re-education, Balance training, Patient/Family education, Self Care, Joint mobilization, Stair training, Aquatic Therapy, Dry Needling, Electrical stimulation, Spinal mobilization, Cryotherapy, Moist heat, Splintting, Taping, Vasopneumatic device,  Traction, Ultrasound, Ionotophoresis 4mg /ml Dexamethasone, Manual therapy, and Re-evaluation  PLAN FOR NEXT SESSION:Assess HEP; continue shoulder stability; shoulder IR strengthening    Claude Manges, PT 05/18/23 3:40 PM [facility] [address], [address] [place], [place] [address] Phone # [phone] Fax [phone]  PHYSICAL THERAPY DISCHARGE SUMMARY  Visits from Start of Care: 8  As of 07/22/2023 Patient has not returned to therapy. Patient to be discharged at this time.   Patient agrees to discharge. Patient goals were partially met. Patient is being discharged due to not returning since the last visit.

## 2023-06-03 ENCOUNTER — Ambulatory Visit: Payer: Self-pay | Admitting: Physical Therapy

## 2023-06-07 NOTE — Progress Notes (Signed)
Brenda Foster , Apr 20, 1966, 57 y.o., female MRN: [id number] Patient Care Team    Relationship Specialty Notifications Start End  Natalia Leatherwood, DO PCP - General Family Medicine  05/12/16   Lendon Colonel, DO Referring Physician   05/12/16    Comment: psych  Napoleon Form, MD Consulting Physician Gastroenterology  06/07/23   [facility]gyn, [facility]    06/07/23     Chief Complaint  Patient presents with   Annual Exam    With combined chronic condition management appointment; pt is fasting; concerned that she may have allergies/asthma     Subjective:Brenda Foster is a 57 y.o.  Pt presents for an OV for CPE and Chronic Conditions/illness Management Health Maintenance: Colonoscopy: completed 12/2018 Dr. Lavon Paganini- rpt 5 yr.  Mammogram: completed:[facility] Cervical cancer screening: last pap:2019, results:normal per pt, completed by: [facility]. - 6yr follow up Immunizations: tdap UTD 03/2014, Influenza-completed today(encouraged yearly), shingrix  completed  Infectious disease screening: HIV completed 2015; hep c completed DEXA:routine screen  Hyperlipidemia/fh heart disease: Pt made dietary changes- avoided sugar and fried foods.  Compliant with Lipitor Exericse: walking but not routine exercise.   Anxiety/tremor/grief:.  Patient reports she feels her medications are working very well for her.  She reports ports compliance with Prozac 40 mg daily and the propranolol 10 mg twice daily. She will occasionally take an ativan when feeling more depressed or anxious.   Prior note: She reports occasional continue grief and tearfulness when thinkingof her father that passed earlier in the year. She is also going through a separation with her husband and moved out in March.  Original note: Pt presents for an OV with complaints of anxiety and tremor from prozac of 1.5 year duration.  Condition is well controlled on prozac 30 mg and Propanolol 10 mg BID. Her prior  psychiatric provider has moved and she would like to come here for her care surrounding this issue.    Pain: She reports compliance with diclofenac. She states this helps keep her active and comfortable.  Dysphagia: New problem. Pt reports she has had 2 episodes over the last year, the last being a few months ago, where she choked while eating and "could not breath." First time it occurred she was eating green beans.  She reports after what felt like a couple minutes, she was able to swallow the food and she felt like she could not breathe although he did feel like she was wheezing. The second occurrence was a few months ago and was also what she was eating and attempt to swallow her food.  Once food was swallowed, symptoms self resolved.     06/08/2023    9:27 AM 12/01/2022   10:04 AM 06/05/2022    8:56 AM 09/30/2021   10:14 AM 04/15/2021    9:08 AM  Depression screen PHQ 2/9  Decreased Interest 0 0 0 0 0  Down, Depressed, Hopeless 1 1 2 1  0  PHQ - 2 Score 1 1 2 1  0  Altered sleeping 1 1 0 0   Tired, decreased energy 1 1 1  0   Change in appetite 2 0 3 1   Feeling bad or failure about yourself  1 1 2 1    Trouble concentrating 1 1 1  0   Moving slowly or fidgety/restless 0 0 0 0   Suicidal thoughts 0 0 0 0   PHQ-9 Score 7 5 9 3    Difficult doing work/chores Not difficult at  all        Allergies  Allergen Reactions   Heparin     Do not give per MD   Sulfa Antibiotics Hives   Social History   Social History Narrative   Lives with husband and children. 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   Past Medical History:  Diagnosis Date   Anxiety    Depression    Mild n/ infertility   History of depression 12/22/2012   Treated for about 10 years w/prozac. Stopped about 2 ya.   History of miscarriage    Infertility    Interstitial cystitis    Low back pain    Migraines    Sepsis (HCC) 12/2016   in hospital 1 week   Urinary tract bacterial infections    2x   Urine incontinence     Mild   Past Surgical History:  Procedure Laterality Date   APPENDECTOMY  1980   BREAST SURGERY  1986   Reduction   CERVICAL CONE BIOPSY  1990   CESAREAN SECTION  2003   1 time   COLONOSCOPY  09/30/2017   polyps   DILATION AND CURETTAGE OF UTERUS  1999-2002   HYSTEROSCOPY  2001   LAPAROSCOPY  2001   LASIK     TUBAL LIGATION  2003   WISDOM TOOTH EXTRACTION     Family History  Problem Relation Age of Onset   Cancer Father        prostate   Heart disease Father 53       MI, premature   Stroke Father        Mind   Prostate cancer Father    Stroke Maternal Grandfather    Anemia Mother    Autism spectrum disorder Daughter    Stroke Maternal Grandmother    Colon cancer Neg Hx    Esophageal cancer Neg Hx    Liver cancer Neg Hx    Pancreatic cancer Neg Hx    Rectal cancer Neg Hx    Stomach cancer Neg Hx    Allergies as of 06/08/2023       Reactions   Heparin    Do not give per MD   Sulfa Antibiotics Hives        Medication List        Accurate as of June 08, 2023 10:31 AM. If you have any questions, ask your nurse or doctor.          atorvastatin 10 MG tablet Commonly known as: LIPITOR Take 1 tablet (10 mg total) by mouth every evening.   diclofenac 75 MG EC tablet Commonly known as: VOLTAREN Take 1 tablet (75 mg total) by mouth 2 (two) times daily.   FLUoxetine 20 MG tablet Commonly known as: PROZAC Take 2 tablets (40 mg total) by mouth daily.   LORazepam 0.5 MG tablet Commonly known as: ATIVAN Take 1 tablet (0.5 mg total) by mouth every 8 (eight) hours as needed for anxiety.   multivitamin tablet Take 1 tablet by mouth daily.   propranolol 10 MG tablet Commonly known as: INDERAL Take 1 tablet (10 mg total) by mouth 2 (two) times daily. Needs appointment for additional refills.        All past medical history, surgical history, allergies, family history, immunizations andmedications were updated in the EMR today and reviewed under the  history and medication portions of their EMR.     ROS: Negative, with the exception of above mentioned in HPI   Objective:  BP 122/78  Pulse 65   Temp 98.2 F (36.8 C)   Ht 5' 6.14" (1.68 m)   Wt 171 lb (77.6 kg)   LMP 01/20/2016 (Approximate)   SpO2 98%   BMI 27.48 kg/m  Body mass index is 27.48 kg/m. Physical Exam Vitals and nursing note reviewed.  Constitutional:      General: She is not in acute distress.    Appearance: Normal appearance. She is not ill-appearing or toxic-appearing.  HENT:     Head: Normocephalic and atraumatic.     Right Ear: Tympanic membrane, ear canal and external ear normal. There is no impacted cerumen.     Left Ear: Tympanic membrane, ear canal and external ear normal. There is no impacted cerumen.     Nose: No congestion or rhinorrhea.     Mouth/Throat:     Mouth: Mucous membranes are moist.     Pharynx: Oropharynx is clear. No oropharyngeal exudate or posterior oropharyngeal erythema.  Eyes:     General: No scleral icterus.       Right eye: No discharge.        Left eye: No discharge.     Extraocular Movements: Extraocular movements intact.     Conjunctiva/sclera: Conjunctivae normal.     Pupils: Pupils are equal, round, and reactive to light.  Cardiovascular:     Rate and Rhythm: Normal rate and regular rhythm.     Pulses: Normal pulses.     Heart sounds: Normal heart sounds. No murmur heard.    No friction rub. No gallop.  Pulmonary:     Effort: Pulmonary effort is normal. No respiratory distress.     Breath sounds: Normal breath sounds. No stridor. No wheezing, rhonchi or rales.  Chest:     Chest wall: No tenderness.  Abdominal:     General: Abdomen is flat. Bowel sounds are normal. There is no distension.     Palpations: Abdomen is soft. There is no mass.     Tenderness: There is no abdominal tenderness. There is no right CVA tenderness, left CVA tenderness, guarding or rebound.     Hernia: No hernia is present.  Genitourinary:     Comments:   Musculoskeletal:        General: No swelling, tenderness or deformity. Normal range of motion.     Cervical back: Normal range of motion and neck supple. No rigidity or tenderness.     Right lower leg: No edema.     Left lower leg: No edema.  Lymphadenopathy:     Cervical: No cervical adenopathy.  Skin:    General: Skin is warm and dry.     Coloration: Skin is not jaundiced or pale.     Findings: No bruising, erythema, lesion or rash.  Neurological:     General: No focal deficit present.     Mental Status: She is alert and oriented to person, place, and time. Mental status is at baseline.     Cranial Nerves: No cranial nerve deficit.     Sensory: No sensory deficit.     Motor: No weakness.     Coordination: Coordination normal.     Gait: Gait normal.     Deep Tendon Reflexes: Reflexes normal.  Psychiatric:        Mood and Affect: Mood normal.        Behavior: Behavior normal.        Thought Content: Thought content normal.        Judgment: Judgment normal.  No results found. No results found. No results found for this or any previous visit (from the past 24 hours).  Assessment/Plan: KERIAN BIBIAN is a 57 y.o. female present for OV for cpe and chronic chronic Conditions/illness Management FH: heart disease/Hyperlipidemia LDL goal <100 Stable Continue lipitor.  Lipid panel collected today  Situational anxiety/Tremor/grief Stable Continue ativan 0.5mg  TID PRN. - [facility] database reviewed today Continue Prozac  40 mg QD Continue propanol 10 mg BID (tremor 2/2 to prozac).  - referred to therapist    Lumbar pain/Bilateral hip pain/Polyarthralgia Stable Continue diclofenac  Dysphagia: The two events describes seems consistent with dysphagia. We discussed esophageal stenosis and like ddx, as possible cause. Lung exam is normal and she has no h/o lung disease.  Referral to GI placed.   Routine general medical examination at a health care  facility Patient was encouraged to exercise greater than 150 minutes a week. Patient was encouraged to choose a diet filled with fresh fruits and vegetables, and lean meats. AVS provided to patient today for education/recommendation on gender specific health and safety maintenance. Colonoscopy: completed 12/2018 Dr. Lavon Paganini- rpt 5 yr.  Mammogram: completed:[facility] Cervical cancer screening: last pap:2019, results:normal per pt, completed by: [facility]. - 40yr follow up Immunizations: tdap UTD 03/2014, Influenza-provided today(encouraged yearly), shingrix  completed  Infectious disease screening: HIV completed 2015; hep c completed DEXA:routine screen   Reviewed expectations re: course of current medical issues. Discussed self-management of symptoms. Outlined signs and symptoms indicating need for more acute intervention. Patient verbalized understanding and all questions were answered. Patient received an After-Visit Summary.    Orders Placed This Encounter  Procedures   Flu vaccine trivalent PF, 6mos and older(Flulaval,Afluria,Fluarix,Fluzone)   CBC   Comp Met (CMET)   TSH   Hemoglobin A1c   Lipid panel   Vitamin D (25 hydroxy)   Ambulatory referral to Gastroenterology   Meds ordered this encounter  Medications   propranolol (INDERAL) 10 MG tablet    Sig: Take 1 tablet (10 mg total) by mouth 2 (two) times daily. Needs appointment for additional refills.    Dispense:  180 tablet    Refill:  1   LORazepam (ATIVAN) 0.5 MG tablet    Sig: Take 1 tablet (0.5 mg total) by mouth every 8 (eight) hours as needed for anxiety.    Dispense:  90 tablet    Refill:  5   FLUoxetine (PROZAC) 20 MG tablet    Sig: Take 2 tablets (40 mg total) by mouth daily.    Dispense:  180 tablet    Refill:  1   diclofenac (VOLTAREN) 75 MG EC tablet    Sig: Take 1 tablet (75 mg total) by mouth 2 (two) times daily.    Dispense:  180 tablet    Refill:  1   atorvastatin (LIPITOR) 10 MG  tablet    Sig: Take 1 tablet (10 mg total) by mouth every evening.    Dispense:  90 tablet    Refill:  3    Referral Orders         Ambulatory referral to Gastroenterology        Note is dictated utilizing voice recognition software. Although note has been proof read prior to signing, occasional typographical errors still can be missed. If any questions arise, please do not hesitate to call for verification.   electronically signed by:  Felix Pacini, DO  [facility] - [place]

## 2023-06-08 ENCOUNTER — Encounter: Payer: Self-pay | Admitting: Family Medicine

## 2023-06-08 ENCOUNTER — Ambulatory Visit: Payer: BC Managed Care – PPO | Admitting: Family Medicine

## 2023-06-08 VITALS — BP 122/78 | HR 65 | Temp 98.2°F | Ht 66.14 in | Wt 171.0 lb

## 2023-06-08 DIAGNOSIS — F418 Other specified anxiety disorders: Secondary | ICD-10-CM

## 2023-06-08 DIAGNOSIS — Z131 Encounter for screening for diabetes mellitus: Secondary | ICD-10-CM | POA: Diagnosis not present

## 2023-06-08 DIAGNOSIS — Z8249 Family history of ischemic heart disease and other diseases of the circulatory system: Secondary | ICD-10-CM | POA: Diagnosis not present

## 2023-06-08 DIAGNOSIS — E559 Vitamin D deficiency, unspecified: Secondary | ICD-10-CM

## 2023-06-08 DIAGNOSIS — E785 Hyperlipidemia, unspecified: Secondary | ICD-10-CM | POA: Diagnosis not present

## 2023-06-08 DIAGNOSIS — M545 Low back pain, unspecified: Secondary | ICD-10-CM

## 2023-06-08 DIAGNOSIS — R1310 Dysphagia, unspecified: Secondary | ICD-10-CM | POA: Insufficient documentation

## 2023-06-08 DIAGNOSIS — Z0000 Encounter for general adult medical examination without abnormal findings: Secondary | ICD-10-CM | POA: Diagnosis not present

## 2023-06-08 DIAGNOSIS — Z23 Encounter for immunization: Secondary | ICD-10-CM | POA: Diagnosis not present

## 2023-06-08 LAB — COMPREHENSIVE METABOLIC PANEL
ALT: 28 U/L (ref 0–35)
AST: 20 U/L (ref 0–37)
Albumin: 4.2 g/dL (ref 3.5–5.2)
Alkaline Phosphatase: 115 U/L (ref 39–117)
BUN: 15 mg/dL (ref 6–23)
CO2: 30 meq/L (ref 19–32)
Calcium: 9 mg/dL (ref 8.4–10.5)
Chloride: 106 meq/L (ref 96–112)
Creatinine, Ser: 0.79 mg/dL (ref 0.40–1.20)
GFR: 82.45 mL/min (ref >60.00)
Glucose, Bld: 84 mg/dL (ref 70–99)
Potassium: 4.4 meq/L (ref 3.5–5.1)
Sodium: 141 meq/L (ref 135–145)
Total Bilirubin: 0.5 mg/dL (ref 0.2–1.2)
Total Protein: 6.4 g/dL (ref 6.0–8.3)

## 2023-06-08 LAB — CBC
HCT: 38.6 % (ref 36.0–46.0)
Hemoglobin: 12.9 g/dL (ref 12.0–15.0)
MCHC: 33.5 g/dL (ref 30.0–36.0)
MCV: 102.5 fL — ABNORMAL HIGH (ref 78.0–100.0)
Platelets: 194 10*3/uL (ref 150.0–400.0)
RBC: 3.77 Mil/uL — ABNORMAL LOW (ref 3.87–5.11)
RDW: 12.5 % (ref 11.5–15.5)
WBC: 6 10*3/uL (ref 4.0–10.5)

## 2023-06-08 LAB — LIPID PANEL
Cholesterol: 164 mg/dL (ref 0–200)
HDL: 46.3 mg/dL (ref >39.00)
LDL Cholesterol: 99 mg/dL (ref 0–99)
NonHDL: 117.43
Total CHOL/HDL Ratio: 4
Triglycerides: 92 mg/dL (ref 0.0–149.0)
VLDL: 18.4 mg/dL (ref 0.0–40.0)

## 2023-06-08 LAB — TSH: TSH: 0.66 u[IU]/mL (ref 0.35–5.50)

## 2023-06-08 LAB — VITAMIN D 25 HYDROXY (VIT D DEFICIENCY, FRACTURES): VITD: 21.86 ng/mL — ABNORMAL LOW (ref 30.00–100.00)

## 2023-06-08 LAB — HEMOGLOBIN A1C: Hgb A1c MFr Bld: 5.4 % (ref 4.6–6.5)

## 2023-06-08 MED ORDER — DICLOFENAC SODIUM 75 MG PO TBEC: 75.0000 mg | DELAYED_RELEASE_TABLET | Freq: Two times a day (BID) | ORAL | 1 refills | Status: DC

## 2023-06-08 MED ORDER — PROPRANOLOL HCL 10 MG PO TABS: 10.0000 mg | ORAL_TABLET | Freq: Two times a day (BID) | ORAL | 1 refills | Status: DC

## 2023-06-08 MED ORDER — ATORVASTATIN CALCIUM 10 MG PO TABS: 10.0000 mg | ORAL_TABLET | Freq: Every evening | ORAL | 3 refills | Status: DC

## 2023-06-08 MED ORDER — LORAZEPAM 0.5 MG PO TABS: 0.5000 mg | ORAL_TABLET | Freq: Three times a day (TID) | ORAL | 5 refills | Status: DC | PRN

## 2023-06-08 MED ORDER — FLUOXETINE HCL 20 MG PO TABS: 40.0000 mg | ORAL_TABLET | Freq: Every day | ORAL | 1 refills | Status: DC

## 2023-06-08 NOTE — Patient Instructions (Addendum)

## 2023-06-09 ENCOUNTER — Encounter: Payer: Self-pay | Admitting: Physical Therapy

## 2023-06-21 ENCOUNTER — Encounter: Payer: Self-pay | Admitting: Physical Therapy

## 2023-08-23 ENCOUNTER — Encounter: Payer: Self-pay | Admitting: Family Medicine

## 2023-08-23 ENCOUNTER — Ambulatory Visit (INDEPENDENT_AMBULATORY_CARE_PROVIDER_SITE_OTHER): Payer: Self-pay | Admitting: Family Medicine

## 2023-08-23 VITALS — BP 129/84 | HR 58 | Temp 98.8°F | Ht 66.14 in | Wt 170.6 lb

## 2023-08-23 DIAGNOSIS — J209 Acute bronchitis, unspecified: Secondary | ICD-10-CM

## 2023-08-23 MED ORDER — AZITHROMYCIN 250 MG PO TABS: ORAL_TABLET | ORAL | 0 refills | Status: AC

## 2023-08-23 NOTE — Progress Notes (Signed)
 Brenda Foster , 07-Apr-1966, 58 y.o., female MRN: [id number] Patient Care Team    Relationship Specialty Notifications Start End  Natalia Leatherwood, DO PCP - General Family Medicine  05/12/16   Lendon Colonel, DO Referring Physician   05/12/16    Comment: psych  Napoleon Form, MD Consulting Physician Gastroenterology  06/07/23   [facility]gyn, [facility]    06/07/23     Chief Complaint  Patient presents with   Sore Throat    1.5 weeks, lost her voice for a few days, dry cough. Denies white patches or soreness. Went to walk in clinic last Tues; neg covid/strep     Subjective: Brenda Foster is a 58 y.o. Pt presents for an OV with complaints of cough and sore throat of 10 days duration.  Associated symptoms include hoarseness. Neg strep and covid last week Pt has tried cough and cold OTC to ease their symptoms.  Cough is improving today.      06/08/2023    9:27 AM 12/01/2022   10:04 AM 06/05/2022    8:56 AM 09/30/2021   10:14 AM 04/15/2021    9:08 AM  Depression screen PHQ 2/9  Decreased Interest 0 0 0 0 0  Down, Depressed, Hopeless 1 1 2 1  0  PHQ - 2 Score 1 1 2 1  0  Altered sleeping 1 1 0 0   Tired, decreased energy 1 1 1  0   Change in appetite 2 0 3 1   Feeling bad or failure about yourself  1 1 2 1    Trouble concentrating 1 1 1  0   Moving slowly or fidgety/restless 0 0 0 0   Suicidal thoughts 0 0 0 0   PHQ-9 Score 7 5 9 3    Difficult doing work/chores Not difficult at all        Allergies  Allergen Reactions   Heparin     Do not give per MD   Sulfa Antibiotics Hives   Social History   Social History Narrative   Lives with husband and children. 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   Past Medical History:  Diagnosis Date   Anxiety    Depression    Mild n/ infertility   History of depression 12/22/2012   Treated for about 10 years w/prozac. Stopped about 2 ya.   History of miscarriage    Infertility    Interstitial cystitis    Low back pain     Migraines    Sepsis (HCC) 12/2016   in hospital 1 week   Urinary tract bacterial infections    2x   Urine incontinence    Mild   Past Surgical History:  Procedure Laterality Date   APPENDECTOMY  1980   BREAST SURGERY  1986   Reduction   CERVICAL CONE BIOPSY  1990   CESAREAN SECTION  2003   1 time   COLONOSCOPY  09/30/2017   polyps   DILATION AND CURETTAGE OF UTERUS  1999-2002   HYSTEROSCOPY  2001   LAPAROSCOPY  2001   LASIK     TUBAL LIGATION  2003   WISDOM TOOTH EXTRACTION     Family History  Problem Relation Age of Onset   Cancer Father        prostate   Heart disease Father 4       MI, premature   Stroke Father        Mind   Prostate cancer Father    Stroke  Maternal Grandfather    Anemia Mother    Autism spectrum disorder Daughter    Stroke Maternal Grandmother    Colon cancer Neg Hx    Esophageal cancer Neg Hx    Liver cancer Neg Hx    Pancreatic cancer Neg Hx    Rectal cancer Neg Hx    Stomach cancer Neg Hx    Allergies as of 08/23/2023       Reactions   Heparin    Do not give per MD   Sulfa Antibiotics Hives        Medication List        Accurate as of August 23, 2023 11:26 AM. If you have any questions, ask your nurse or doctor.          atorvastatin 10 MG tablet Commonly known as: LIPITOR Take 1 tablet (10 mg total) by mouth every evening.   diclofenac 75 MG EC tablet Commonly known as: VOLTAREN Take 1 tablet (75 mg total) by mouth 2 (two) times daily.   FLUoxetine 20 MG tablet Commonly known as: PROZAC Take 2 tablets (40 mg total) by mouth daily.   LORazepam 0.5 MG tablet Commonly known as: ATIVAN Take 1 tablet (0.5 mg total) by mouth every 8 (eight) hours as needed for anxiety.   multivitamin tablet Take 1 tablet by mouth daily.   propranolol 10 MG tablet Commonly known as: INDERAL Take 1 tablet (10 mg total) by mouth 2 (two) times daily. Needs appointment for additional refills.        All past medical history,  surgical history, allergies, family history, immunizations andmedications were updated in the EMR today and reviewed under the history and medication portions of their EMR.     Review of Systems  Constitutional:  Negative for chills and fever.  HENT:  Positive for ear pain and sore throat. Negative for congestion, ear discharge and sinus pain.   Eyes: Negative.   Respiratory:  Positive for cough. Negative for sputum production, shortness of breath and wheezing.   Cardiovascular: Negative.   Gastrointestinal:  Negative for abdominal pain, constipation, diarrhea, nausea and vomiting.  Musculoskeletal:  Negative for myalgias.  Neurological:  Negative for dizziness and headaches.   Negative, with the exception of above mentioned in HPI   Objective:  BP 129/84   Pulse (!) 58   Temp 98.8 F (37.1 C) (Oral)   Ht 5' 6.14" (1.68 m)   Wt 170 lb 9.6 oz (77.4 kg)   LMP 01/20/2016 (Approximate)   SpO2 99%   BMI 27.42 kg/m  Body mass index is 27.42 kg/m. Physical Exam Vitals and nursing note reviewed.  Constitutional:      General: She is not in acute distress.    Appearance: Normal appearance. She is normal weight. She is not ill-appearing or toxic-appearing.  HENT:     Head: Normocephalic and atraumatic.     Comments: Hoarseness present    Right Ear: Tympanic membrane, ear canal and external ear normal.     Left Ear: Tympanic membrane, ear canal and external ear normal.     Nose: No congestion or rhinorrhea.     Mouth/Throat:     Mouth: Mucous membranes are moist.     Pharynx: No oropharyngeal exudate or posterior oropharyngeal erythema.  Eyes:     General: No scleral icterus.       Right eye: No discharge.        Left eye: No discharge.     Extraocular Movements: Extraocular movements  intact.     Conjunctiva/sclera: Conjunctivae normal.     Pupils: Pupils are equal, round, and reactive to light.  Cardiovascular:     Rate and Rhythm: Normal rate and regular rhythm.  Pulmonary:      Effort: Pulmonary effort is normal. No respiratory distress.     Breath sounds: Normal breath sounds. No wheezing, rhonchi or rales.  Musculoskeletal:     Cervical back: Neck supple. No tenderness.  Lymphadenopathy:     Cervical: No cervical adenopathy.  Skin:    Findings: No rash.  Neurological:     Mental Status: She is alert and oriented to person, place, and time. Mental status is at baseline.     Motor: No weakness.     Coordination: Coordination normal.     Gait: Gait normal.  Psychiatric:        Mood and Affect: Mood normal.        Behavior: Behavior normal.        Thought Content: Thought content normal.        Judgment: Judgment normal.     No results found. No results found. No results found for this or any previous visit (from the past 24 hours).  Assessment/Plan: JAYLNN ULLERY is a 58 y.o. female present for OV for  Acute bronchitis with symptoms > 10 days (Primary) Rest, hydrate.  mucinex (DM if cough), nettie pot or nasal saline.  Azith (if needed) prescribed, take until completed.  If cough present it can last up to 6-8 weeks.  F/U 2 weeks if not improved.   Reviewed expectations re: course of current medical issues. Discussed self-management of symptoms. Outlined signs and symptoms indicating need for more acute intervention. Patient verbalized understanding and all questions were answered. Patient received an After-Visit Summary.    No orders of the defined types were placed in this encounter.  No orders of the defined types were placed in this encounter.  Referral Orders  No referral(s) requested today     Note is dictated utilizing voice recognition software. Although note has been proof read prior to signing, occasional typographical errors still can be missed. If any questions arise, please do not hesitate to call for verification.   electronically signed by:  Felix Pacini, DO  [facility] [place]

## 2023-08-23 NOTE — Patient Instructions (Addendum)
 Return in about 2 weeks (around 09/06/2023), or if symptoms worsen or fail to improve.        Great to see you today.  I have refilled the medication(s) we provide.   If labs were collected or images ordered, we will inform you of  results once we have received them and reviewed. We will contact you either by echart message, or telephone call.  Please give ample time to the testing facility, and our office to run,  receive and review results. Please do not call inquiring of results, even if you can see them in your chart. We will contact you as soon as we are able. If it has been over 1 week since the test was completed, and you have not yet heard from [place], then please call us.    - echart message- for normal results that have been seen by the patient already.   - telephone call: abnormal results or if patient has not viewed results in their echart.  If a referral to a specialist was entered for you, please call us in 2 weeks if you have not heard from the specialist office to schedule.

## 2023-10-06 ENCOUNTER — Other Ambulatory Visit: Payer: Self-pay | Admitting: Family Medicine

## 2023-10-06 DIAGNOSIS — Z1231 Encounter for screening mammogram for malignant neoplasm of breast: Secondary | ICD-10-CM

## 2023-10-07 ENCOUNTER — Ambulatory Visit
Admission: RE | Admit: 2023-10-07 | Discharge: 2023-10-07 | Disposition: A | Discharge status: Home / Self Care | Source: Ambulatory Visit | Attending: Family Medicine | Admitting: Family Medicine

## 2023-10-07 DIAGNOSIS — Z1231 Encounter for screening mammogram for malignant neoplasm of breast: Secondary | ICD-10-CM

## 2023-10-13 ENCOUNTER — Encounter: Payer: Self-pay | Admitting: Family Medicine

## 2023-10-26 ENCOUNTER — Telehealth: Payer: Self-pay

## 2023-10-26 MED ORDER — FLUOXETINE HCL 40 MG PO CAPS
40.0000 mg | ORAL_CAPSULE | Freq: Every day | ORAL | 1 refills | Status: DC
Start: 2023-10-26 — End: 2024-02-24

## 2023-10-26 NOTE — Addendum Note (Signed)
 Addended by: Napolean Backbone A on: 10/26/2023 02:11 PM   Modules accepted: Orders

## 2023-10-26 NOTE — Telephone Encounter (Signed)
 Please call and inform patient: We received notification we had to change her Prozac  tablet, to a capsule due to her insurance.  I have called in the 40 mg capsule, therefore she will take 1 a day only.

## 2023-10-26 NOTE — Telephone Encounter (Signed)
 Pt made aware

## 2023-10-26 NOTE — Telephone Encounter (Signed)
 Pharmacy fax regarding Fluoxetine  stating insurance is no longer covering tablet form, may need to switch to capsule.  Please further advise

## 2023-10-28 ENCOUNTER — Ambulatory Visit: Admitting: Physician Assistant

## 2023-11-01 ENCOUNTER — Ambulatory Visit: Admitting: Gastroenterology

## 2023-11-01 ENCOUNTER — Encounter: Payer: Self-pay | Admitting: Gastroenterology

## 2023-11-01 VITALS — BP 122/68 | HR 58 | Ht 66.0 in | Wt 174.0 lb

## 2023-11-01 DIAGNOSIS — R1310 Dysphagia, unspecified: Secondary | ICD-10-CM

## 2023-11-01 DIAGNOSIS — Z860100 Personal history of colon polyps, unspecified: Secondary | ICD-10-CM

## 2023-11-01 DIAGNOSIS — R194 Change in bowel habit: Secondary | ICD-10-CM | POA: Diagnosis not present

## 2023-11-01 DIAGNOSIS — Z860101 Personal history of adenomatous and serrated colon polyps: Secondary | ICD-10-CM | POA: Diagnosis not present

## 2023-11-01 DIAGNOSIS — K219 Gastro-esophageal reflux disease without esophagitis: Secondary | ICD-10-CM

## 2023-11-01 MED ORDER — OMEPRAZOLE 20 MG PO CPDR: 20.0000 mg | DELAYED_RELEASE_CAPSULE | Freq: Every day | ORAL | 3 refills | Status: DC

## 2023-11-01 NOTE — Patient Instructions (Addendum)
 _______________________________________________________  If your blood pressure at your visit was 140/90 or greater, please contact your primary care physician to follow up on this.  If you are age 59 or younger, your body mass index should be between 19-25. Your Body mass index is 28.08 kg/m. If this is out of the aformentioned range listed, please consider follow up with your Primary Care Provider.  ________________________________________________________  The [facility] providers would like to encourage you to use MYCHART to communicate with providers for non-urgent requests or questions.  Due to long hold times on the telephone, sending your provider a message by [facility] may be a faster and more efficient way to get a response.  Please allow 48 business hours for a response.  Please remember that this is for non-urgent requests.  _______________________________________________________  We have sent the following medications to your pharmacy for you to pick up at your convenience: START: omeprazole 20mg  one capsule daily  Please purchase the following medications over the counter and take as directed: START: fiber supplement daily.  You have been scheduled for a Barium Esophogram at [facility] (1st floor of the hospital) on 11-18-23 at 10:00am.  Please arrive 15 minutes prior to your appointment for registration. Make certain not to have anything to eat or drink 3 hours prior to your test. If you need to reschedule for any reason, please contact radiology at [phone] to do so. __________________________________________________________________ A barium swallow is an examination that concentrates on views of the esophagus. This tends to be a double contrast exam (barium and two liquids which, when combined, create a gas to distend the wall of the oesophagus) or single contrast (non-ionic iodine based). The study is usually tailored to your symptoms so a good history is essential.  Attention is paid during the study to the form, structure and configuration of the esophagus, looking for functional disorders (such as aspiration, dysphagia, achalasia, motility and reflux)  EXAMINATION  You may be asked to change into a gown, depending on the type of swallow being performed. A radiologist and radiographer will perform the procedure. The radiologist will advise you of the type of contrast selected for your procedure and direct you during the exam. You will be asked to stand, sit or lie in several different positions and to hold a small amount of fluid in your mouth before being asked to swallow while the imaging is performed .In some instances you may be asked to swallow barium coated marshmallows to assess the motility of a solid food bolus.  The exam can be recorded as a digital or video fluoroscopy procedure.  POST PROCEDURE  It will take 1-2 days for the barium to pass through your system. To facilitate this, it is important, unless otherwise directed, to increase your fluids for the next 24-48hrs and to resume your normal diet.   This test typically takes about 30 minutes to perform. __________________________________________________________________________________  Due to recent changes in healthcare laws, you may see the results of your imaging and laboratory studies on MyChart before your provider has had a chance to review them.  We understand that in some cases there may be results that are confusing or concerning to you. Not all laboratory results come back in the same time frame and the provider may be waiting for multiple results in order to interpret others.  Please give us  48 hours in order for your provider to thoroughly review all the results before contacting the office for clarification of your results.  Thank you for entrusting me with your care and choosing [facility].  [place], PA-C

## 2023-11-01 NOTE — Progress Notes (Signed)
 Chief Complaint: dysphagia Primary GI MD: Dr. Leonia Raman  HPI: Discussed the use of AI scribe software for clinical note transcription with the patient, who gave verbal consent to proceed.  History of Present Illness Brenda Foster is a 58 year old female who presents with episodes of difficulty breathing associated with eating.  She experiences episodes of difficulty breathing primarily associated with eating, occurring approximately three times over the last 2 years. Two episodes were related to eating, and one occurred without food intake. During these episodes, she feels unable to talk or move, prompting her son to attempt the Heimlich maneuver, although she does not feel any obstruction or pain. She also doesn't necessarily feel the food "stuck." The episodes are described as frightening, accompanied by dizziness but no loss of consciousness. The first episode occurred after eating green beans, and the second after consuming a snack mix. These episodes are infrequent, occurring once a year, with the last one six to eight months ago.  She has a history of heartburn, though it is not associated with the episodes of difficulty breathing. No nausea or vomiting. She recalls a similar sensation years ago while eating bread with balsamic vinegar, which resolved quickly. She also experiences phlegm production after eating, particularly with milk and sweet tea, leading her to suspect lactose intolerance. She has not been on any reflux medication.  She reports inconsistent bowel habits, alternating between constipation and diarrhea, a pattern present for the last couple of years. She was primarily constipated before having children. No rectal bleeding or significant weight loss.  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 She has four grown children and has experienced stress related to personal circumstances, including a  divorce.   PREVIOUS GI WORKUP   Colonoscopy 12/2018 for history of piecemeal removal of large sessile adenoma 2019, personal history of colon polyps - Mild diverticulosis in the sigmoid colon and in the descending colon.  - Non- bleeding internal hemorrhoids.  - The examination was otherwise normal.  - No specimens collected. - Repeat 5 years (12/2023).  Past Medical History:  Diagnosis Date   Anxiety    Depression    Mild n/ infertility   History of depression 12/22/2012   Treated for about 10 years w/prozac . Stopped about 2 ya.   History of miscarriage    Infertility    Interstitial cystitis    Low back pain    Migraines    Sepsis (HCC) 12/2016   in hospital 1 week   Urinary tract bacterial infections    2x   Urine incontinence    Mild    Past Surgical History:  Procedure Laterality Date   APPENDECTOMY  1980   BREAST SURGERY  1986   Reduction   CERVICAL CONE BIOPSY  1990   CESAREAN SECTION  2003   1 time   COLONOSCOPY  09/30/2017   polyps   DILATION AND CURETTAGE OF UTERUS  1999-2002   HYSTEROSCOPY  2001   LAPAROSCOPY  2001   LASIK     REDUCTION MAMMAPLASTY Bilateral    TUBAL LIGATION  2003   WISDOM TOOTH EXTRACTION      Current Outpatient Medications  Medication Sig Dispense Refill   atorvastatin  (LIPITOR) 10 MG tablet Take 1 tablet (10 mg total) by mouth every evening. 90 tablet 3   diclofenac  (VOLTAREN ) 75 MG EC tablet Take 1 tablet (75 mg total) by mouth 2 (two) times daily. 180 tablet  1   FLUoxetine  (PROZAC ) 40 MG capsule Take 1 capsule (40 mg total) by mouth daily. 90 capsule 1   LORazepam  (ATIVAN ) 0.5 MG tablet Take 1 tablet (0.5 mg total) by mouth every 8 (eight) hours as needed for anxiety. 90 tablet 5   Multiple Vitamin (MULTIVITAMIN) tablet Take 1 tablet by mouth daily.     omeprazole (PRILOSEC) 20 MG capsule Take 1 capsule (20 mg total) by mouth daily. 30 capsule 3   propranolol  (INDERAL ) 10 MG tablet Take 1 tablet (10 mg total) by mouth 2 (two)  times daily. Needs appointment for additional refills. 180 tablet 1   No current facility-administered medications for this visit.    Allergies as of 11/01/2023 - Review Complete 11/01/2023  Allergen Reaction Noted   Heparin  01/07/2017   Sulfa antibiotics Hives 12/22/2012    Family History  Problem Relation Age of Onset   Anemia Mother    Cancer Father        prostate   Heart disease Father 20       MI, premature   Stroke Father        Mind   Prostate cancer Father    Stroke Maternal Grandmother    Stroke Maternal Grandfather    Autism spectrum disorder Daughter    Colon cancer Neg Hx    Esophageal cancer Neg Hx    Liver cancer Neg Hx     Social History   Socioeconomic History   Marital status: Married    Spouse name: Not on file   Number of children: 4   Years of education: Not on file   Highest education level: Not on file  Occupational History   Occupation: school psychologist    Employer: [employer]  Tobacco Use   Smoking status: Never   Smokeless tobacco: Never  Vaping Use   Vaping status: Never Used  Substance and Sexual Activity   Alcohol  use: Yes    Alcohol /week: 7.0 - 10.0 standard drinks of alcohol     Types: 7 - 10 Glasses of wine per week    Comment: occ   Drug use: No   Sexual activity: Yes    Partners: Male    Birth control/protection: Surgical  Other Topics Concern   Not on file  Social History Narrative   Lives with husband and children. 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   Social Drivers of Corporate investment banker Strain: Not on [employer] Insecurity: Not on file  Transportation Needs: Not on file  Physical Activity: Not on file  Stress: Not on file  Social Connections: Not on file  Intimate Partner Violence: Not on file    Review of Systems:    Constitutional: No weight loss, fever, chills, weakness or fatigue HEENT: Eyes: No change in vision               Ears, Nose, Throat:  No change in hearing or congestion Skin:  No rash or itching Cardiovascular: No chest pain, chest pressure or palpitations   Respiratory: No SOB or cough Gastrointestinal: See HPI and otherwise negative Genitourinary: No dysuria or change in urinary frequency Neurological: No headache, dizziness or syncope Musculoskeletal: No new muscle or joint pain Hematologic: No bleeding or bruising Psychiatric: No history of depression or anxiety    Physical Exam:  Vital signs: BP 122/68   Pulse (!) 58   Ht 5\' 6"  (1.676 m)   Wt 174 lb (78.9 kg)   LMP 01/20/2016 (Approximate)  BMI 28.08 kg/m   Constitutional: NAD, alert and cooperative Head:  Normocephalic and atraumatic. Eyes:   PEERL, EOMI. No icterus. Conjunctiva pink. Respiratory: Respirations even and unlabored. Lungs clear to auscultation bilaterally.   No wheezes, crackles, or rhonchi.  Cardiovascular:  Regular rate and rhythm. No peripheral edema, cyanosis or pallor.  Gastrointestinal:  Soft, nondistended, nontender. No rebound or guarding. Normal bowel sounds. No appreciable masses or hepatomegaly. Rectal:  Declines Msk:  Symmetrical without gross deformities. Without edema, no deformity or joint abnormality.  Neurologic:  Alert and  oriented x4;  grossly normal neurologically.  Skin:   Dry and intact without significant lesions or rashes. Psychiatric: Oriented to person, place and time. Demonstrates good judgement and reason without abnormal affect or behaviors.   RELEVANT LABS AND IMAGING: CBC    Component Value Date/Time   WBC 6.0 06/08/2023 0927   RBC 3.77 (L) 06/08/2023 0927   HGB 12.9 06/08/2023 0927   HGB 13.8 01/22/2010 0000   HCT 38.6 06/08/2023 0927   PLT 194.0 06/08/2023 0927   MCV 102.5 (H) 06/08/2023 0927   MCH 33.7 (H) 01/15/2017 1546   MCHC 33.5 06/08/2023 0927   RDW 12.5 06/08/2023 0927   RDW 12.1 01/22/2010 0000   LYMPHSABS 1.9 12/19/2020 1013   MONOABS 0.4 12/19/2020 1013   EOSABS 0.1 12/19/2020 1013   BASOSABS 0.0 12/19/2020 1013     CMP     Component Value Date/Time   NA 141 06/08/2023 0927   NA 139 01/22/2010 0000   K 4.4 06/08/2023 0927   K 4.2 01/22/2010 0000   CL 106 06/08/2023 0927   CL 105 01/22/2010 0000   CO2 30 06/08/2023 0927   GLUCOSE 84 06/08/2023 0927   BUN 15 06/08/2023 0927   BUN 15 01/22/2010 0000   CREATININE 0.79 06/08/2023 0927   CREATININE 0.74 01/15/2017 1546   CALCIUM  9.0 06/08/2023 0927   CALCIUM  9.0 01/22/2010 0000   PROT 6.4 06/08/2023 0927   PROT 7.2 01/22/2010 0000   ALBUMIN 4.2 06/08/2023 0927   ALBUMIN 4.2 01/22/2010 0000   AST 20 06/08/2023 0927   AST 16 01/22/2010 0000   ALT 28 06/08/2023 0927   ALKPHOS 115 06/08/2023 0927   ALKPHOS 98 01/22/2010 0000   BILITOT 0.5 06/08/2023 0927   BILITOT 0.5 01/22/2010 0000   GFRNONAA >89 01/15/2017 1546   GFRAA >89 01/15/2017 1546     Assessment/Plan:   Assessment & Plan  Dysphagia 3 episodes of "dysphagia" over the past year characterized by difficulty breathing with eating suggests esophageal spasm or dysmotility. Differential includes esophageal spasm, dysmotility, or panic attack.  Coincidentally these occurrences have happened while she is under new stressor of the divorce.  No associated GERD. - Order barium swallow to evaluate esophageal movement and potential narrowing. - Can consider EGD based on barium swallow findings  GERD Phlegm production after eating, sometimes worse with dairy intake and caffeine.  Suspect GERD. - Trial of omeprazole 20 Mg once daily - Implement daily elimination diet - Alert made to call patient in June for update on symptoms   Bowel irregularity Alternating constipation and diarrhea possibly related to stress or lactose intolerance. Discussed stress impact on gastrointestinal symptoms. - Initiate Benefiber once to twice daily. - Consider impact of lactose elimination on bowel habits.  History of colon polyps Last colonoscopy 12/2018 with mild diverticulosis and internal hemorrhoids  and recall 5 years.  Previous personal history of large piecemeal removal of sessile serrated adenoma on colonoscopy in 2019 -  Due for repeat 12/2023   [facility] 11/01/2023, 3:41 PM  Cc: Napolean Backbone A, DO

## 2023-11-18 ENCOUNTER — Ambulatory Visit (HOSPITAL_COMMUNITY)
Admission: RE | Admit: 2023-11-18 | Discharge: 2023-11-18 | Disposition: A | Discharge status: Home / Self Care | Source: Ambulatory Visit | Attending: Gastroenterology | Admitting: Gastroenterology

## 2023-11-18 ENCOUNTER — Ambulatory Visit: Payer: Self-pay | Admitting: Gastroenterology

## 2023-11-18 DIAGNOSIS — R1310 Dysphagia, unspecified: Secondary | ICD-10-CM | POA: Diagnosis present

## 2023-11-18 DIAGNOSIS — K219 Gastro-esophageal reflux disease without esophagitis: Secondary | ICD-10-CM | POA: Diagnosis present

## 2023-11-28 ENCOUNTER — Other Ambulatory Visit: Payer: Self-pay | Admitting: Family Medicine

## 2023-11-30 ENCOUNTER — Ambulatory Visit: Payer: BC Managed Care – PPO | Admitting: Family Medicine

## 2023-11-30 ENCOUNTER — Encounter: Payer: Self-pay | Admitting: Family Medicine

## 2023-11-30 VITALS — BP 120/82 | HR 64 | Temp 98.3°F | Wt 171.0 lb

## 2023-11-30 DIAGNOSIS — M545 Low back pain, unspecified: Secondary | ICD-10-CM | POA: Diagnosis not present

## 2023-11-30 DIAGNOSIS — F418 Other specified anxiety disorders: Secondary | ICD-10-CM

## 2023-11-30 DIAGNOSIS — M2550 Pain in unspecified joint: Secondary | ICD-10-CM | POA: Diagnosis not present

## 2023-11-30 DIAGNOSIS — E785 Hyperlipidemia, unspecified: Secondary | ICD-10-CM

## 2023-11-30 MED ORDER — LORAZEPAM 0.5 MG PO TABS: 0.5000 mg | ORAL_TABLET | Freq: Three times a day (TID) | ORAL | 5 refills | Status: DC | PRN

## 2023-11-30 MED ORDER — DICLOFENAC SODIUM 75 MG PO TBEC: 75.0000 mg | DELAYED_RELEASE_TABLET | Freq: Two times a day (BID) | ORAL | 1 refills | Status: DC

## 2023-11-30 MED ORDER — PROPRANOLOL HCL 10 MG PO TABS: 10.0000 mg | ORAL_TABLET | Freq: Two times a day (BID) | ORAL | 1 refills | Status: DC

## 2023-11-30 NOTE — Progress Notes (Signed)
 Brenda Foster , 1966-06-01, 58 y.o., female MRN: [id number] Patient Care Team    Relationship Specialty Notifications Start End  Mariel Shope, DO PCP - General Family Medicine  05/12/16   Trygve Gage, DO Referring Physician   05/12/16    Comment: psych  Sergio Dandy, MD Consulting Physician Gastroenterology  06/07/23   Obgyn, Corbin Dess    06/07/23     Chief Complaint  Patient presents with   Hyperlipidemia     Subjective:Brenda Foster is a 57 y.o.  Pt presents for Chronic Conditions/illness Management  Hyperlipidemia/fh heart disease: Pt made dietary changes- avoided sugar and fried foods.  Compliant with Lipitor Exericse: walking but not routine exercise.   Anxiety/tremor/grief:.  Patient reports she feels her medications are working well for her.  She reports compliance  with Prozac  40 mg daily and the propranolol  10 mg twice daily. She will occasionally take an ativan  when feeling more depressed or anxious.   Prior note: She reports occasional continue grief and tearfulness when thinkingof her father that passed earlier in the year. She is also going through a separation with her husband and moved out in March.  Original note: Pt presents for an OV with complaints of anxiety and tremor from prozac  of 1.5 year duration.  Condition is well controlled on prozac  30 mg and Propanolol 10 mg BID. Her prior psychiatric provider has moved and she would like to come here for her care surrounding this issue.    Pain: She reports compliance with diclofenac . She states this helps keep her active and comfortable.      11/30/2023    9:13 AM 11/30/2023    9:10 AM 06/08/2023    9:27 AM 12/01/2022   10:04 AM 06/05/2022    8:56 AM  Depression screen PHQ 2/9  Decreased Interest 0 0 0 0 0  Down, Depressed, Hopeless 0 0 1 1 2   PHQ - 2 Score 0 0 1 1 2   Altered sleeping 0 0 1 1 0  Tired, decreased energy 1 0 1 1 1   Change in appetite 0 0 2 0 3  Feeling bad or  failure about yourself  1 0 1 1 2   Trouble concentrating 0 0 1 1 1   Moving slowly or fidgety/restless 0 0 0 0 0  Suicidal thoughts 0 0 0 0 0  PHQ-9 Score 2 0 7 5 9   Difficult doing work/chores Not difficult at all Not difficult at all Not difficult at all      Allergies  Allergen Reactions   Heparin     Do not give per MD   Sulfa Antibiotics Hives   Social History   Social History Narrative   Lives with husband and children. 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   Past Medical History:  Diagnosis Date   Anxiety    Depression    Mild n/ infertility   History of depression 12/22/2012   Treated for about 10 years w/prozac . Stopped about 2 ya.   History of miscarriage    Infertility    Interstitial cystitis    Low back pain    Migraines    Sepsis (HCC) 12/2016   in hospital 1 week   Urinary tract bacterial infections    2x   Urine incontinence    Mild   Past Surgical History:  Procedure Laterality Date   APPENDECTOMY  1980   BREAST SURGERY  1986   Reduction   CERVICAL CONE BIOPSY  1990   CESAREAN SECTION  2003   1 time   COLONOSCOPY  09/30/2017   polyps   DILATION AND CURETTAGE OF UTERUS  1999-2002   HYSTEROSCOPY  2001   LAPAROSCOPY  2001   LASIK     REDUCTION MAMMAPLASTY Bilateral    TUBAL LIGATION  2003   WISDOM TOOTH EXTRACTION     Family History  Problem Relation Age of Onset   Anemia Mother    Cancer Father        prostate   Heart disease Father 21       MI, premature   Stroke Father        Mind   Prostate cancer Father    Stroke Maternal Grandmother    Stroke Maternal Grandfather    Autism spectrum disorder Daughter    Colon cancer Neg Hx    Esophageal cancer Neg Hx    Liver cancer Neg Hx    Allergies as of 11/30/2023       Reactions   Heparin    Do not give per MD   Sulfa Antibiotics Hives        Medication List        Accurate as of November 30, 2023  9:26 AM. If you have any questions, ask your nurse or doctor.           atorvastatin  10 MG tablet Commonly known as: LIPITOR Take 1 tablet (10 mg total) by mouth every evening.   diclofenac  75 MG EC tablet Commonly known as: VOLTAREN  Take 1 tablet (75 mg total) by mouth 2 (two) times daily.   FLUoxetine  40 MG capsule Commonly known as: PROZAC  Take 1 capsule (40 mg total) by mouth daily.   LORazepam  0.5 MG tablet Commonly known as: ATIVAN  Take 1 tablet (0.5 mg total) by mouth every 8 (eight) hours as needed for anxiety.   multivitamin tablet Take 1 tablet by mouth daily.   omeprazole  20 MG capsule Commonly known as: PRILOSEC Take 1 capsule (20 mg total) by mouth daily.   propranolol  10 MG tablet Commonly known as: INDERAL  Take 1 tablet (10 mg total) by mouth 2 (two) times daily. Needs appointment for additional refills.        All past medical history, surgical history, allergies, family history, immunizations andmedications were updated in the EMR today and reviewed under the history and medication portions of their EMR.     ROS: Negative, with the exception of above mentioned in HPI   Objective:  BP 120/82   Pulse 64   Temp 98.3 F (36.8 C)   Wt 171 lb (77.6 kg)   LMP 01/20/2016 (Approximate)   SpO2 97%   BMI 27.60 kg/m  Body mass index is 27.6 kg/m. Physical Exam Vitals and nursing note reviewed.  Constitutional:      General: She is not in acute distress.    Appearance: Normal appearance. She is normal weight. She is not ill-appearing or toxic-appearing.  HENT:     Head: Normocephalic and atraumatic.  Eyes:     General: No scleral icterus.       Right eye: No discharge.        Left eye: No discharge.     Extraocular Movements: Extraocular movements intact.     Conjunctiva/sclera: Conjunctivae normal.     Pupils: Pupils are equal, round, and reactive to light.  Cardiovascular:     Rate and Rhythm: Normal rate and regular rhythm.  Pulmonary:     Effort: Pulmonary  effort is normal.     Breath sounds: Normal breath  sounds.  Skin:    Findings: No rash.  Neurological:     Mental Status: She is alert and oriented to person, place, and time. Mental status is at baseline.     Motor: No weakness.     Coordination: Coordination normal.     Gait: Gait normal.  Psychiatric:        Mood and Affect: Mood normal.        Behavior: Behavior normal.        Thought Content: Thought content normal.        Judgment: Judgment normal.     No results found. No results found. No results found for this or any previous visit (from the past 24 hours).  Assessment/Plan: Brenda Foster is a 58 y.o. female present for OV for chronic chronic Conditions/illness Management FH: heart disease/Hyperlipidemia LDL goal <100 Stable Continue  lipitor.  Lipid UTD, due next visit  Situational anxiety/Tremor/grief Stable Continue  ativan  0.5mg  TID PRN. - NCCS database reviewed today Continue Prozac   40 mg QD Continue propanol 10 mg BID (tremor 2/2 to prozac ).  - referred to therapist    Lumbar pain/Bilateral hip pain/Polyarthralgia Stable Continue diclofenac    Reviewed expectations re: course of current medical issues. Discussed self-management of symptoms. Outlined signs and symptoms indicating need for more acute intervention. Patient verbalized understanding and all questions were answered. Patient received an After-Visit Summary.    No orders of the defined types were placed in this encounter.  Meds ordered this encounter  Medications   diclofenac  (VOLTAREN ) 75 MG EC tablet    Sig: Take 1 tablet (75 mg total) by mouth 2 (two) times daily.    Dispense:  180 tablet    Refill:  1   LORazepam  (ATIVAN ) 0.5 MG tablet    Sig: Take 1 tablet (0.5 mg total) by mouth every 8 (eight) hours as needed for anxiety.    Dispense:  90 tablet    Refill:  5   propranolol  (INDERAL ) 10 MG tablet    Sig: Take 1 tablet (10 mg total) by mouth 2 (two) times daily. Needs appointment for additional refills.    Dispense:  180  tablet    Refill:  1    Referral Orders  No referral(s) requested today       Note is dictated utilizing voice recognition software. Although note has been proof read prior to signing, occasional typographical errors still can be missed. If any questions arise, please do not hesitate to call for verification.   electronically signed by:  Napolean Backbone, DO  [facility] [place]

## 2023-11-30 NOTE — Patient Instructions (Addendum)
 Return in about 6 months (around 06/08/2024) for cpe (40 min), Routine chronic condition follow-up PAP.        Great to see you today.  I have refilled the medication(s) we provide.   If labs were collected or images ordered, we will inform you of  results once we have received them and reviewed. We will contact you either by echart message, or telephone call.  Please give ample time to the testing facility, and our office to run,  receive and review results. Please do not call inquiring of results, even if you can see them in your chart. We will contact you as soon as we are able. If it has been over 1 week since the test was completed, and you have not yet heard from us , then please call us .    - echart message- for normal results that have been seen by the patient already.   - telephone call: abnormal results or if patient has not viewed results in their echart.  If a referral to a specialist was entered for you, please call us  in 2 weeks if you have not heard from the specialist office to schedule.

## 2023-12-03 ENCOUNTER — Telehealth: Payer: Self-pay | Admitting: Gastroenterology

## 2023-12-03 NOTE — Telephone Encounter (Signed)
 Called to check in on patient.  She has not had any further episodes of dysphagia.  She feels her phlegm production is somewhat improved on omeprazole  20 Mg once daily.  Notices mainly with certain foods such as spicy foods or lemonade.  She wants to do a trial of being off omeprazole  and see if it makes a difference in her symptoms.  Overall she is doing well this time with no complaints and appreciated my phone call.  She was advised to call us  if she has any questions or concerns

## 2024-02-24 ENCOUNTER — Other Ambulatory Visit: Payer: Self-pay | Admitting: Family Medicine

## 2024-02-25 ENCOUNTER — Other Ambulatory Visit: Payer: Self-pay | Admitting: Gastroenterology

## 2024-03-06 ENCOUNTER — Ambulatory Visit: Payer: Self-pay

## 2024-03-06 NOTE — Telephone Encounter (Signed)
 Pt scheduled

## 2024-03-06 NOTE — Telephone Encounter (Signed)
 FYI Only or Action Required?: FYI only for provider.  Patient was last seen in primary care on 11/30/2023 by Catherine Fuller A, DO.  Called Nurse Triage reporting Tingling and Fatigue.  Symptoms began several weeks ago.  Interventions attempted: Rest, hydration, or home remedies.  Symptoms are: gradually worsening.  Triage Disposition: See HCP Within 4 Hours (Or PCP Triage)  Patient/caregiver understands and will follow disposition?: No, refuses disposition     Copied from CRM [id number]. Topic: Clinical - Red Word Triage >> Mar 06, 2024  1:30 PM Pinkey ORN wrote: Red Word that prompted transfer to Nurse Triage: Tingling Left Arm >> Mar 06, 2024  1:31 PM Pinkey ORN wrote: Patient states she's experiencing some tingling in her left arm. Reason for Disposition  [1] Tingling (e.g., pins and needles) of the face, arm / hand, or leg / foot on one side of the body AND [2] present now  (Exceptions: Chronic/recurrent symptom lasting > 4 weeks; or from known cause, such as: bumped elbow, carpal tunnel, pinched nerve.)  Answer Assessment - Initial Assessment Questions Advised pt be examined in next 4 hours, pt refusing appts at other offices today, scheduled earliest pt would accept per office/personal schedules for 9/18 with pt stating understanding to call back or seek immediate care if any worsening/new symptoms.      1. SYMPTOM: What is the main symptom you are concerned about? (e.g., weakness, numbness)     No numbness or weakness, this is week 3, comes and goes with certain positions or tip head back Tip head back sends electric from tip of shoulder to thumb Other times most of time elbow to thumb 2. ONSET: When did this start? (e.g., minutes, hours, days; while sleeping)     3 weeks ago 4. PATTERN Does this come and go, or has it been constant since it started?  Is it present now?     Present now because was reaching for something, can make it come and go, nothing  random 5. CARDIAC SYMPTOMS: Have you had any of the following symptoms: chest pain, difficulty breathing, palpitations?     No chest pain or SOB 6. NEUROLOGIC SYMPTOMS: Have you had any of the following symptoms: headache, dizziness, vision loss, double vision, changes in speech, unsteady on your feet?     No headache, dizziness, vision changes, speech changes, feeling unsteady 7. OTHER SYMPTOMS: Do you have any other symptoms?     Don't think injury but overused that arm trimming bushes and bicep was sore for couple days after trimmed bushes, then instead of getting better started getting tingling so think related It's stayed the same during this time rather than getting worse Gotten worse from beginning, like first couple days, thought had walked into spider web or something, something light on your skin, little by little got more, been consistently like this (electric and tingling) now for like a week  Am exhausted but don't think it's related to that, if sitting in chair and put head down could fall asleep Feeling more fatigued than usual but 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

## 2024-03-09 ENCOUNTER — Encounter: Payer: Self-pay | Admitting: Family Medicine

## 2024-03-09 ENCOUNTER — Ambulatory Visit: Admitting: Family Medicine

## 2024-03-09 VITALS — BP 122/72 | HR 63 | Temp 98.2°F | Wt 171.0 lb

## 2024-03-09 DIAGNOSIS — Z23 Encounter for immunization: Secondary | ICD-10-CM

## 2024-03-09 DIAGNOSIS — R200 Anesthesia of skin: Secondary | ICD-10-CM | POA: Diagnosis not present

## 2024-03-09 DIAGNOSIS — M5030 Other cervical disc degeneration, unspecified cervical region: Secondary | ICD-10-CM | POA: Insufficient documentation

## 2024-03-09 DIAGNOSIS — R202 Paresthesia of skin: Secondary | ICD-10-CM

## 2024-03-09 DIAGNOSIS — K219 Gastro-esophageal reflux disease without esophagitis: Secondary | ICD-10-CM | POA: Insufficient documentation

## 2024-03-09 MED ORDER — MNEXSPIKE 10 MCG/0.2ML IM SUSY: 10.0000 ug | PREFILLED_SYRINGE | Freq: Once | INTRAMUSCULAR | 0 refills | Status: AC

## 2024-03-09 MED ORDER — PREDNISONE 20 MG PO TABS: ORAL_TABLET | ORAL | 0 refills | Status: DC

## 2024-03-09 MED ORDER — TIZANIDINE HCL 4 MG PO TABS: 2.0000 mg | ORAL_TABLET | Freq: Every day | ORAL | 0 refills | Status: AC

## 2024-03-09 NOTE — Progress Notes (Signed)
 Brenda Foster , 1965/08/09, 58 y.o., female MRN: [id number] Patient Care Team    Relationship Specialty Notifications Start End  Catherine Charlies LABOR, DO PCP - General Family Medicine  05/12/16   Chalice Keneth LABOR, DO Referring Physician   05/12/16    Comment: psych  Shila Gustav GAILS, MD Consulting Physician Gastroenterology  06/07/23   Obgyn, Anna    06/07/23     Chief Complaint  Patient presents with   Tingling    1 month, L arm; spreads through hand. Tingling comes and goes, numbness.      Subjective: Brenda Foster is a 58 y.o. Pt presents for an OV with complaints of Left arm numbness and tingling of greater than 3 weeks duration.  Associated symptoms include symptoms worsen with use of her arm especially reaching above her head.. Pt has tried anything to ease their symptoms.  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  She reports this particular incidences occurred after she was trimming the bushes in her yard. She reports the tingling sensation can feel like it occurs from her elbow to her thumb on the left hand, but sometimes can originate up on the top of her shoulder to her left thumb. She has never had a known neck injury or surgery.  She has not experienced symptoms like this in the past.  Per EMR review she did have a MRI of her cervical spine 06/18/2016 for sudden onset left-sided neck pain and left shoulder pain with left arm numbness. EXAM: MRI CERVICAL SPINE WITHOUT CONTRAST FINDINGS: Alignment: Slight straightening of the cervical lordosis. Vertebrae: No fracture, evidence of discitis, or bone lesion. No significant facet arthritis. Cord: No myelopathy or mass lesions. Posterior Fossa, vertebral arteries, paraspinal tissues: Negative. Disc levels: Craniocervical junction through C2-3:  Normal. C3-4: Tiny disc bulges to the right and left of midline with  no neural impingement. C4-5: Disc osteophyte complex protrudes centrally and to the right compressing the ventral aspect of the right side of the spinal cord extending into the right neural foramen. This could affect the right C5 nerve. No myelopathy. Slight narrowing of the left neural foramen due to uncinate spurs. C5-6: Small central subligamentous disc protrusion indenting the ventral aspect of the spinal cord symmetrically without myelopathy. Slight narrowing of the right neural foramen. Widely patent left neural foramen. C6-7: Small central subligamentous disc bulge with no neural impingement. Widely patent neural foramina. C7-T1 and T1-2:  Negative. IMPRESSION: 1. Minimal narrowing of the left neural foramen at C4-5 without focal neural impingement. 2. Soft disc protrusion central and to the right at C4-5 which could affect the right C5 nerve. 3. Central soft disc protrusion at C5-6 without focal neural impingement.     11/30/2023    9:13 AM 11/30/2023    9:10 AM 06/08/2023    9:27 AM 12/01/2022   10:04 AM 06/05/2022    8:56 AM  Depression screen PHQ 2/9  Decreased Interest 0 0 0 0 0  Down, Depressed, Hopeless 0 0 1 1 2   PHQ - 2 Score 0 0 1 1 2   Altered sleeping 0 0 1 1 0  Tired, decreased energy 1 0 1 1 1   Change in appetite 0 0 2 0 3  Feeling bad or failure about yourself  1 0 1 1 2   Trouble concentrating 0 0 1 1 1   Moving slowly or fidgety/restless  0 0 0 0 0  Suicidal thoughts 0 0 0 0 0  PHQ-9 Score 2 0 7 5 9   Difficult doing work/chores Not difficult at all Not difficult at all Not difficult at all      Allergies  Allergen Reactions   Heparin     Do not give per MD   Sulfa Antibiotics Hives   Social History   Social History Narrative   Lives with husband and children. 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   Past Medical History:  Diagnosis Date   Anxiety    Depression    Mild n/ infertility   History of depression 12/22/2012   Treated for about 10 years  w/prozac . Stopped about 2 ya.   History of miscarriage    Infertility    Interstitial cystitis    Low back pain    Migraines    Night sweats 12/15/2017   Sepsis (HCC) 12/2016   in hospital 1 week   Urinary tract bacterial infections    2x   Urine incontinence    Mild   Past Surgical History:  Procedure Laterality Date   APPENDECTOMY  1980   BREAST SURGERY  1986   Reduction   CERVICAL CONE BIOPSY  1990   CESAREAN SECTION  2003   1 time   COLONOSCOPY  09/30/2017   polyps   DILATION AND CURETTAGE OF UTERUS  1999-2002   HYSTEROSCOPY  2001   LAPAROSCOPY  2001   LASIK     REDUCTION MAMMAPLASTY Bilateral    TUBAL LIGATION  2003   WISDOM TOOTH EXTRACTION     Family History  Problem Relation Age of Onset   Anemia Mother    Cancer Father        prostate   Heart disease Father 31       MI, premature   Stroke Father        Mind   Prostate cancer Father    Stroke Maternal Grandmother    Stroke Maternal Grandfather    Autism spectrum disorder Daughter    Colon cancer Neg Hx    Esophageal cancer Neg Hx    Liver cancer Neg Hx    Allergies as of 03/09/2024       Reactions   Heparin    Do not give per MD   Sulfa Antibiotics Hives        Medication List        Accurate as of March 09, 2024 11:32 AM. If you have any questions, ask your nurse or doctor.          atorvastatin  10 MG tablet Commonly known as: LIPITOR Take 1 tablet (10 mg total) by mouth every evening.   diclofenac  75 MG EC tablet Commonly known as: VOLTAREN  Take 1 tablet (75 mg total) by mouth 2 (two) times daily.   FLUoxetine  40 MG capsule Commonly known as: PROZAC  TAKE 1 CAPSULE (40 MG TOTAL) BY MOUTH DAILY.   LORazepam  0.5 MG tablet Commonly known as: ATIVAN  Take 1 tablet (0.5 mg total) by mouth every 8 (eight) hours as needed for anxiety.   mNexspike 10 MCG/0.2ML Susy Generic drug: COVID-19 mRNA Vacc (Moderna) Inject 10 mcg into the muscle once for 1 dose. Started by: Aemilia Dedrick   multivitamin tablet Take 1 tablet by mouth daily.   omeprazole  20 MG capsule Commonly known as: PRILOSEC TAKE 1 CAPSULE BY MOUTH EVERY DAY   predniSONE  20 MG tablet Commonly known as: DELTASONE  60 mg x3d, 40 mg  x3d, 20 mg x2d, 10 mg x2d Started by: Charlies Bellini   propranolol  10 MG tablet Commonly known as: INDERAL  Take 1 tablet (10 mg total) by mouth 2 (two) times daily. Needs appointment for additional refills.   tiZANidine  4 MG tablet Commonly known as: Zanaflex  Take 0.5-1 tablets (2-4 mg total) by mouth at bedtime. Started by: Charlies Bellini        All past medical history, surgical history, allergies, family history, immunizations andmedications were updated in the EMR today and reviewed under the history and medication portions of their EMR.     ROS Negative, with the exception of above mentioned in HPI   Objective:  BP 122/72   Pulse 63   Temp 98.2 F (36.8 C)   Wt 171 lb (77.6 kg)   LMP 01/20/2016 (Approximate)   SpO2 98%   BMI 27.60 kg/m  Body mass index is 27.6 kg/m.  Physical Exam Vitals and nursing note reviewed.  Constitutional:      General: She is not in acute distress.    Appearance: Normal appearance. She is normal weight. She is not ill-appearing or toxic-appearing.  HENT:     Head: Normocephalic and atraumatic.  Eyes:     General: No scleral icterus.       Right eye: No discharge.        Left eye: No discharge.     Extraocular Movements: Extraocular movements intact.     Conjunctiva/sclera: Conjunctivae normal.     Pupils: Pupils are equal, round, and reactive to light.  Musculoskeletal:     Cervical back: Spasms and tenderness present. No swelling, edema, signs of trauma or bony tenderness. Pain with movement present. Normal range of motion.     Comments: Cervical spine: No erythema, no bony tenderness.  Mild cervical paraspinal ropiness.  Reproducible symptoms and tenderness to palpation C5/C6/left and brachial plexus.  Full  range of motion with left neck  mild discomfort in right sidebending.  Neurovascularly intact distally.  Skin:    Findings: No rash.  Neurological:     Mental Status: She is alert and oriented to person, place, and time. Mental status is at baseline.     Motor: No weakness.     Coordination: Coordination normal.     Gait: Gait normal.  Psychiatric:        Mood and Affect: Mood normal.        Behavior: Behavior normal.        Thought Content: Thought content normal.        Judgment: Judgment normal.     No results found. No results found. No results found for this or any previous visit (from the past 24 hours).  Assessment/Plan: Brenda Foster is a 58 y.o. female present for OV for  Influenza vaccine needed Declined Need for vaccination for pneumococcus Declined COVID vaccination desired-prescription written and sent to pharmacy  Numbness and tingling of upper extremity (Primary)/DDD (degenerative disc disease), cervical Patient had similar symptoms in 2017 with some degenerative changes on an MRI completed at that time.  Suspect she has irritated cervical nerve with overdoing it with yard work.  Hopefully will respond to anti-inflammatory/prednisone  and muscle relaxant. Avoid heavy lifting, repetitive movements or reaching above head with the left arm for 4 weeks. Prednisone  taper prescribed Zanaflex  2-4 mg nightly prescribed Follow-up in 3-4 weeks, that her MRI if needed at that time.  Reviewed expectations re: course of current medical issues. Discussed self-management of symptoms. Outlined signs and symptoms indicating  need for more acute intervention. Patient verbalized understanding and all questions were answered. Patient received an After-Visit Summary.    No orders of the defined types were placed in this encounter.  Meds ordered this encounter  Medications   predniSONE  (DELTASONE ) 20 MG tablet    Sig: 60 mg x3d, 40 mg x3d, 20 mg x2d, 10 mg x2d     Dispense:  18 tablet    Refill:  0   tiZANidine  (ZANAFLEX ) 4 MG tablet    Sig: Take 0.5-1 tablets (2-4 mg total) by mouth at bedtime.    Dispense:  30 tablet    Refill:  0   COVID-19 mRNA Vacc, Moderna, (MNEXSPIKE) 10 MCG/0.2ML SUSY    Sig: Inject 10 mcg into the muscle once for 1 dose.    Dispense:  0.2 mL    Refill:  0   Referral Orders  No referral(s) requested today     Note is dictated utilizing voice recognition software. Although note has been proof read prior to signing, occasional typographical errors still can be missed. If any questions arise, please do not hesitate to call for verification.   electronically signed by:  Charlies Bellini, DO  [facility] [place]

## 2024-03-09 NOTE — Patient Instructions (Addendum)
 Return in about 26 days (around 04/04/2024).        Great to see you today.  I have refilled the medication(s) we provide.   If labs were collected or images ordered, we will inform you of  results once we have received them and reviewed. We will contact you either by echart message, or telephone call.  Please give ample time to the testing facility, and our office to run,  receive and review results. Please do not call inquiring of results, even if you can see them in your chart. We will contact you as soon as we are able. If it has been over 1 week since the test was completed, and you have not yet heard from us , then please call us .    - echart message- for normal results that have been seen by the patient already.   - telephone call: abnormal results or if patient has not viewed results in their echart.  If a referral to a specialist was entered for you, please call us  in 2 weeks if you have not heard from the specialist office to schedule.

## 2024-03-14 ENCOUNTER — Encounter: Payer: Self-pay | Admitting: Gastroenterology

## 2024-03-20 ENCOUNTER — Encounter: Payer: Self-pay | Admitting: Family Medicine

## 2024-03-20 ENCOUNTER — Ambulatory Visit: Admitting: Family Medicine

## 2024-03-20 VITALS — BP 104/76 | HR 78 | Temp 98.6°F | Ht 66.0 in | Wt 167.5 lb

## 2024-03-20 DIAGNOSIS — M5030 Other cervical disc degeneration, unspecified cervical region: Secondary | ICD-10-CM

## 2024-03-20 DIAGNOSIS — M4802 Spinal stenosis, cervical region: Secondary | ICD-10-CM | POA: Insufficient documentation

## 2024-03-20 DIAGNOSIS — R200 Anesthesia of skin: Secondary | ICD-10-CM | POA: Insufficient documentation

## 2024-03-20 DIAGNOSIS — R202 Paresthesia of skin: Secondary | ICD-10-CM

## 2024-03-20 NOTE — Patient Instructions (Addendum)
 Return in about 4 weeks (around 04/17/2024), or if symptoms worsen or fail to improve.        Great to see you today.  I have refilled the medication(s) we provide.   If labs were collected or images ordered, we will inform you of  results once we have received them and reviewed. We will contact you either by echart message, or telephone call.  Please give ample time to the testing facility, and our office to run,  receive and review results. Please do not call inquiring of results, even if you can see them in your chart. We will contact you as soon as we are able. If it has been over 1 week since the test was completed, and you have not yet heard from us , then please call us .    - echart message- for normal results that have been seen by the patient already.   - telephone call: abnormal results or if patient has not viewed results in their echart.  If a referral to a specialist was entered for you, please call us  in 2 weeks if you have not heard from the specialist office to schedule.  Thank you Dex prednisone  is it okay I am something for more sensitive test actually scared

## 2024-03-20 NOTE — Progress Notes (Signed)
 To go      Brenda Foster , 1965/12/23, 58 y.o., female MRN: [id number] Patient Care Team    Relationship Specialty Notifications Start End  Catherine Charlies LABOR, DO PCP - General Family Medicine  05/12/16   Chalice Keneth LABOR, DO Referring Physician   05/12/16    Comment: psych  Nandigam, Kavitha V, MD Consulting Physician Gastroenterology  06/07/23   Obgyn, Anna    06/07/23     Chief Complaint  Patient presents with   Medication Reaction    Possilbe reaction to the prednisone . Patient states a couple days after taking prednisone  she felt unsteady, shaky hands/arms, agitation, upset stomach, feeling unwell, anxious, feeling like she is crawling out of my skin. She also have sores in her mouth and sores on the outside.      Subjective: Brenda Foster is a 58 y.o. Pt presents for an OV with with complaints of feeling jittery anxious, upset stomach.  She states that since she finished the prednisone  she has had increased agitation.  Had prednisone  in the past and did not have the same reaction.  She is wondering if it is from the prednisone  or if she is experiencing increased anxiety. She has a prescription for Ativan , but has not used it in quite some time. She reports the prednisone  and muscle relaxer, did not seem to help her send tingling.  Prior note: 03/09/2024 complaints of Left arm numbness and tingling of greater than 3 weeks duration.  Associated symptoms include symptoms worsen with use of her arm especially reaching above her head.. Pt has tried anything to ease their symptoms.  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  She reports this particular incidences occurred after she was trimming the bushes in her yard. She reports the tingling sensation can feel like it occurs from her elbow to her thumb on the left hand, but sometimes can originate up on the top of her shoulder to her  left thumb. She has never had a known neck injury or surgery.  She has not experienced symptoms like this in the past.  Per EMR review she did have a MRI of her cervical spine 06/18/2016 for sudden onset left-sided neck pain and left shoulder pain with left arm numbness. EXAM: MRI CERVICAL SPINE WITHOUT CONTRAST FINDINGS: Alignment: Slight straightening of the cervical lordosis. Vertebrae: No fracture, evidence of discitis, or bone lesion. No significant facet arthritis. Cord: No myelopathy or mass lesions. Posterior Fossa, vertebral arteries, paraspinal tissues: Negative. Disc levels: Craniocervical junction through C2-3:  Normal. C3-4: Tiny disc bulges to the right and left of midline with no neural impingement. C4-5: Disc osteophyte complex protrudes centrally and to the right compressing the ventral aspect of the right side of the spinal cord extending into the right neural foramen. This could affect the right C5 nerve. No myelopathy. Slight narrowing of the left neural foramen due to uncinate spurs. C5-6: Small central subligamentous disc protrusion indenting the ventral aspect of the spinal cord symmetrically without myelopathy. Slight narrowing of the right neural foramen. Widely patent left neural foramen. C6-7: Small central subligamentous disc bulge with no neural impingement. Widely patent neural foramina. C7-T1 and T1-2:  Negative. IMPRESSION: 1. Minimal narrowing of the left neural foramen at C4-5 without focal neural impingement. 2. Soft disc protrusion central and to the right at C4-5 which could affect the right C5 nerve. 3. Central soft disc protrusion at  C5-6 without focal neural impingement.     03/20/2024   10:03 AM 11/30/2023    9:13 AM 11/30/2023    9:10 AM 06/08/2023    9:27 AM 12/01/2022   10:04 AM  Depression screen PHQ 2/9  Decreased Interest 0 0 0 0 0  Down, Depressed, Hopeless 0 0 0 1 1  PHQ - 2 Score 0 0 0 1 1  Altered sleeping 0 0 0 1 1   Tired, decreased energy 0 1 0 1 1  Change in appetite 0 0 0 2 0  Feeling bad or failure about yourself  0 1 0 1 1  Trouble concentrating 0 0 0 1 1  Moving slowly or fidgety/restless 0 0 0 0 0  Suicidal thoughts 0 0 0 0 0  PHQ-9 Score 0 2 0 7 5  Difficult doing work/chores Not difficult at all Not difficult at all Not difficult at all Not difficult at all       05/12/2016    8:22 AM 12/01/2022   10:03 AM 06/08/2023    9:27 AM 11/30/2023    9:10 AM 03/20/2024   10:04 AM  Fall Risk  Falls in the past year? No  0 0 0 0  Was there an injury with Fall?  0 0  0  Fall Risk Category Calculator  0 0  0  Patient at Risk for Falls Due to  No Fall Risks No Fall Risks  No Fall Risks  Fall risk Follow up  Falls evaluation completed Falls evaluation completed Falls evaluation completed Falls evaluation completed     Data saved with a previous flowsheet row definition    Allergies  Allergen Reactions   Heparin     Do not give per MD   Sulfa Antibiotics Hives   Social History   Social History Narrative   Lives with husband and children. 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   Past Medical History:  Diagnosis Date   Anxiety    Depression    Mild n/ infertility   History of depression 12/22/2012   Treated for about 10 years w/prozac . Stopped about 2 ya.   History of miscarriage    Infertility    Interstitial cystitis    Low back pain    Migraines    Night sweats 12/15/2017   Sepsis (HCC) 12/2016   in hospital 1 week   Urinary tract bacterial infections    2x   Urine incontinence    Mild   Past Surgical History:  Procedure Laterality Date   APPENDECTOMY  1980   BREAST SURGERY  1986   Reduction   CERVICAL CONE BIOPSY  1990   CESAREAN SECTION  2003   1 time   COLONOSCOPY  09/30/2017   polyps   DILATION AND CURETTAGE OF UTERUS  1999-2002   HYSTEROSCOPY  2001   LAPAROSCOPY  2001   LASIK     REDUCTION MAMMAPLASTY Bilateral    TUBAL LIGATION  2003   WISDOM TOOTH EXTRACTION      Family History  Problem Relation Age of Onset   Anemia Mother    Cancer Father        prostate   Heart disease Father 30       MI, premature   Stroke Father        Mind   Prostate cancer Father    Stroke Maternal Grandmother    Stroke Maternal Grandfather    Autism spectrum disorder Daughter    Colon cancer  Neg Hx    Esophageal cancer Neg Hx    Liver cancer Neg Hx    Allergies as of 03/20/2024       Reactions   Heparin    Do not give per MD   Sulfa Antibiotics Hives        Medication List        Accurate as of March 20, 2024 11:39 AM. If you have any questions, ask your nurse or doctor.          atorvastatin  10 MG tablet Commonly known as: LIPITOR Take 1 tablet (10 mg total) by mouth every evening.   diclofenac  75 MG EC tablet Commonly known as: VOLTAREN  Take 1 tablet (75 mg total) by mouth 2 (two) times daily.   FLUoxetine  40 MG capsule Commonly known as: PROZAC  TAKE 1 CAPSULE (40 MG TOTAL) BY MOUTH DAILY.   LORazepam  0.5 MG tablet Commonly known as: ATIVAN  Take 1 tablet (0.5 mg total) by mouth every 8 (eight) hours as needed for anxiety.   multivitamin tablet Take 1 tablet by mouth daily.   omeprazole  20 MG capsule Commonly known as: PRILOSEC TAKE 1 CAPSULE BY MOUTH EVERY DAY What changed:  when to take this reasons to take this   predniSONE  20 MG tablet Commonly known as: DELTASONE  60 mg x3d, 40 mg x3d, 20 mg x2d, 10 mg x2d   propranolol  10 MG tablet Commonly known as: INDERAL  Take 1 tablet (10 mg total) by mouth 2 (two) times daily. Needs appointment for additional refills.   tiZANidine  4 MG tablet Commonly known as: Zanaflex  Take 0.5-1 tablets (2-4 mg total) by mouth at bedtime.        All past medical history, surgical history, allergies, family history, immunizations andmedications were updated in the EMR today and reviewed under the history and medication portions of their EMR.     ROS Negative, with the exception of  above mentioned in HPI   Objective:  BP 104/76 (BP Location: Left Arm, Patient Position: Sitting, Cuff Size: Normal)   Pulse 78   Temp 98.6 F (37 C) (Oral)   Ht 5' 6 (1.676 m)   Wt 167 lb 8 oz (76 kg)   LMP 01/20/2016 (Approximate)   SpO2 100%   BMI 27.04 kg/m  Body mass index is 27.04 kg/m. Physical Exam Vitals and nursing note reviewed.  Constitutional:      General: She is not in acute distress.    Appearance: Normal appearance. She is normal weight. She is not ill-appearing or toxic-appearing.  HENT:     Head: Normocephalic and atraumatic.  Eyes:     General: No scleral icterus.       Right eye: No discharge.        Left eye: No discharge.     Extraocular Movements: Extraocular movements intact.     Conjunctiva/sclera: Conjunctivae normal.     Pupils: Pupils are equal, round, and reactive to light.  Musculoskeletal:     Comments: Musculoskeletal:  Cervical spine: Spasms and tenderness present. No swelling, edema, signs of trauma or bony tenderness.  No erythema no bony tenderness.  Cervical paraspinal ropiness present.  Reproducible symptoms at C5-C6 on the left and over brachial plexus. Pain reproducible paresthesia with movement present.  Normal range of motion.    Skin:    Findings: No rash.  Neurological:     Mental Status: She is alert and oriented to person, place, and time. Mental status is at baseline.     Motor:  No weakness.     Coordination: Coordination normal.     Gait: Gait normal.     Comments: Patient does appear jittery today status post prednisone   Psychiatric:        Mood and Affect: Mood normal.        Behavior: Behavior normal.        Thought Content: Thought content normal.        Judgment: Judgment normal.      No results found. No results found. No results found for this or any previous visit (from the past 24 hours).  Assessment/Plan: Brenda Foster is a 58 y.o. 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  Currently experiencing numbness and tingling left extremity that is worsening over the last 6 months that did not respond to steroid taper and muscle relaxant. We discussed moving forward with MRI and she is agreeable to this approach. - MR Cervical Spine Wo Contrast; Future Will discuss results with patient once received refer to appropriate specialty team if needed. -For now continue the diclofenac  and muscle relaxant  Anxiousness: We discussed the sensation of anxiousness and nervousness that she is feeling is most likely the prednisone  taper. I encouraged her to use her Ativan  prescription as prescribed over the next week.  Reviewed expectations re: course of current medical issues. Discussed self-management of symptoms. Outlined signs and symptoms indicating need for more acute intervention. Patient verbalized understanding and all questions were answered. Patient received an After-Visit Summary.    Orders Placed This Encounter  Procedures   MR Cervical Spine Wo Contrast   No orders of the defined types were placed in this encounter.  Referral Orders  No referral(s) requested today     Note is dictated utilizing voice recognition software. Although note has been proof read prior to signing, occasional typographical errors still can be missed. If any questions arise, please do not hesitate to call for verification.   electronically signed by:  Charlies Bellini, DO  [facility] [place]

## 2024-03-27 ENCOUNTER — Encounter: Payer: Self-pay | Admitting: Family Medicine

## 2024-03-31 ENCOUNTER — Other Ambulatory Visit: Payer: Self-pay | Admitting: Family Medicine

## 2024-04-03 DIAGNOSIS — R7689 Other specified abnormal immunological findings in serum: Secondary | ICD-10-CM | POA: Insufficient documentation

## 2024-04-04 ENCOUNTER — Ambulatory Visit
Admission: RE | Admit: 2024-04-04 | Discharge: 2024-04-04 | Disposition: A | Discharge status: Home / Self Care | Source: Ambulatory Visit | Attending: Family Medicine | Admitting: Family Medicine

## 2024-04-04 DIAGNOSIS — M4802 Spinal stenosis, cervical region: Secondary | ICD-10-CM

## 2024-04-04 DIAGNOSIS — R200 Anesthesia of skin: Secondary | ICD-10-CM

## 2024-04-04 DIAGNOSIS — M5030 Other cervical disc degeneration, unspecified cervical region: Secondary | ICD-10-CM

## 2024-04-05 ENCOUNTER — Ambulatory Visit: Admitting: Family Medicine

## 2024-04-06 ENCOUNTER — Ambulatory Visit: Payer: Self-pay | Admitting: Family Medicine

## 2024-04-06 DIAGNOSIS — M4802 Spinal stenosis, cervical region: Secondary | ICD-10-CM | POA: Insufficient documentation

## 2024-04-06 DIAGNOSIS — R200 Anesthesia of skin: Secondary | ICD-10-CM

## 2024-04-17 ENCOUNTER — Ambulatory Visit (INDEPENDENT_AMBULATORY_CARE_PROVIDER_SITE_OTHER): Admitting: Family Medicine

## 2024-04-17 ENCOUNTER — Encounter: Payer: Self-pay | Admitting: Family Medicine

## 2024-04-17 DIAGNOSIS — E785 Hyperlipidemia, unspecified: Secondary | ICD-10-CM

## 2024-04-17 NOTE — Progress Notes (Signed)
     Monday morning canceled

## 2024-04-17 NOTE — Patient Instructions (Signed)

## 2024-04-21 ENCOUNTER — Encounter

## 2024-05-05 ENCOUNTER — Encounter: Admitting: Gastroenterology

## 2024-05-26 ENCOUNTER — Other Ambulatory Visit: Payer: Self-pay | Admitting: Family Medicine

## 2024-05-27 ENCOUNTER — Other Ambulatory Visit: Payer: Self-pay | Admitting: Gastroenterology

## 2024-06-08 ENCOUNTER — Other Ambulatory Visit (HOSPITAL_COMMUNITY)
Admission: RE | Admit: 2024-06-08 | Discharge: 2024-06-08 | Disposition: A | Discharge status: Home / Self Care | Source: Ambulatory Visit | Attending: Family Medicine | Admitting: Family Medicine

## 2024-06-08 ENCOUNTER — Encounter: Payer: Self-pay | Admitting: Family Medicine

## 2024-06-08 ENCOUNTER — Ambulatory Visit: Admitting: Family Medicine

## 2024-06-08 VITALS — BP 110/64 | HR 64 | Temp 98.1°F | Ht 66.0 in | Wt 171.6 lb

## 2024-06-08 DIAGNOSIS — E2839 Other primary ovarian failure: Secondary | ICD-10-CM

## 2024-06-08 DIAGNOSIS — Z13820 Encounter for screening for osteoporosis: Secondary | ICD-10-CM

## 2024-06-08 DIAGNOSIS — Z131 Encounter for screening for diabetes mellitus: Secondary | ICD-10-CM | POA: Diagnosis not present

## 2024-06-08 DIAGNOSIS — Z8249 Family history of ischemic heart disease and other diseases of the circulatory system: Secondary | ICD-10-CM | POA: Diagnosis not present

## 2024-06-08 DIAGNOSIS — E785 Hyperlipidemia, unspecified: Secondary | ICD-10-CM

## 2024-06-08 DIAGNOSIS — F418 Other specified anxiety disorders: Secondary | ICD-10-CM | POA: Diagnosis not present

## 2024-06-08 DIAGNOSIS — Z23 Encounter for immunization: Secondary | ICD-10-CM | POA: Diagnosis not present

## 2024-06-08 DIAGNOSIS — Z1231 Encounter for screening mammogram for malignant neoplasm of breast: Secondary | ICD-10-CM

## 2024-06-08 DIAGNOSIS — R251 Tremor, unspecified: Secondary | ICD-10-CM | POA: Diagnosis not present

## 2024-06-08 DIAGNOSIS — Z0000 Encounter for general adult medical examination without abnormal findings: Secondary | ICD-10-CM

## 2024-06-08 DIAGNOSIS — S52511S Displaced fracture of right radial styloid process, sequela: Secondary | ICD-10-CM

## 2024-06-08 DIAGNOSIS — E559 Vitamin D deficiency, unspecified: Secondary | ICD-10-CM

## 2024-06-08 DIAGNOSIS — Z01419 Encounter for gynecological examination (general) (routine) without abnormal findings: Secondary | ICD-10-CM | POA: Insufficient documentation

## 2024-06-08 DIAGNOSIS — Z79899 Other long term (current) drug therapy: Secondary | ICD-10-CM | POA: Diagnosis not present

## 2024-06-08 DIAGNOSIS — Z124 Encounter for screening for malignant neoplasm of cervix: Secondary | ICD-10-CM | POA: Insufficient documentation

## 2024-06-08 DIAGNOSIS — Z5181 Encounter for therapeutic drug level monitoring: Secondary | ICD-10-CM | POA: Diagnosis not present

## 2024-06-08 DIAGNOSIS — Z113 Encounter for screening for infections with a predominantly sexual mode of transmission: Secondary | ICD-10-CM | POA: Diagnosis not present

## 2024-06-08 LAB — COMPREHENSIVE METABOLIC PANEL WITH GFR
ALT: 20 U/L (ref 3–35)
AST: 17 U/L (ref 5–37)
Albumin: 4.1 g/dL (ref 3.5–5.2)
Alkaline Phosphatase: 120 U/L — ABNORMAL HIGH (ref 39–117)
BUN: 15 mg/dL (ref 6–23)
CO2: 29 meq/L (ref 19–32)
Calcium: 8.9 mg/dL (ref 8.4–10.5)
Chloride: 107 meq/L (ref 96–112)
Creatinine, Ser: 0.81 mg/dL (ref 0.40–1.20)
GFR: 79.45 mL/min (ref >60.00)
Glucose, Bld: 90 mg/dL (ref 70–99)
Potassium: 4.1 meq/L (ref 3.5–5.1)
Sodium: 143 meq/L (ref 135–145)
Total Bilirubin: 0.5 mg/dL (ref 0.2–1.2)
Total Protein: 6.2 g/dL (ref 6.0–8.3)

## 2024-06-08 LAB — LIPID PANEL
Cholesterol: 153 mg/dL (ref 28–200)
HDL: 39 mg/dL — ABNORMAL LOW (ref >39.00)
LDL Cholesterol: 90 mg/dL (ref 10–99)
NonHDL: 113.73
Total CHOL/HDL Ratio: 4
Triglycerides: 121 mg/dL (ref 10.0–149.0)
VLDL: 24.2 mg/dL (ref 0.0–40.0)

## 2024-06-08 LAB — CBC
HCT: 35.8 % — ABNORMAL LOW (ref 36.0–46.0)
Hemoglobin: 12.3 g/dL (ref 12.0–15.0)
MCHC: 34.5 g/dL (ref 30.0–36.0)
MCV: 99.9 fl (ref 78.0–100.0)
Platelets: 177 K/uL (ref 150.0–400.0)
RBC: 3.59 Mil/uL — ABNORMAL LOW (ref 3.87–5.11)
RDW: 13 % (ref 11.5–15.5)
WBC: 4.2 K/uL (ref 4.0–10.5)

## 2024-06-08 LAB — HEMOGLOBIN A1C: Hgb A1c MFr Bld: 5.2 % (ref 4.6–6.5)

## 2024-06-08 LAB — MAGNESIUM: Magnesium: 1.9 mg/dL (ref 1.5–2.5)

## 2024-06-08 LAB — B12 AND FOLATE PANEL
Folate: 8.4 ng/mL (ref >5.9)
Vitamin B-12: 422 pg/mL (ref 211–911)

## 2024-06-08 LAB — TSH: TSH: 0.91 u[IU]/mL (ref 0.35–5.50)

## 2024-06-08 LAB — VITAMIN D 25 HYDROXY (VIT D DEFICIENCY, FRACTURES): VITD: 15.66 ng/mL — ABNORMAL LOW (ref 30.00–100.00)

## 2024-06-08 MED ORDER — LORAZEPAM 0.5 MG PO TABS: 0.5000 mg | ORAL_TABLET | Freq: Three times a day (TID) | ORAL | 5 refills | Status: AC | PRN

## 2024-06-08 MED ORDER — PROPRANOLOL HCL 10 MG PO TABS: 10.0000 mg | ORAL_TABLET | Freq: Two times a day (BID) | ORAL | 1 refills | Status: AC

## 2024-06-08 MED ORDER — ATORVASTATIN CALCIUM 10 MG PO TABS: 10.0000 mg | ORAL_TABLET | Freq: Every evening | ORAL | 3 refills | Status: DC

## 2024-06-08 MED ORDER — DICLOFENAC SODIUM 75 MG PO TBEC: 75.0000 mg | DELAYED_RELEASE_TABLET | Freq: Two times a day (BID) | ORAL | 1 refills | Status: DC

## 2024-06-08 MED ORDER — FLUOXETINE HCL 40 MG PO CAPS: 40.0000 mg | ORAL_CAPSULE | Freq: Every day | ORAL | 1 refills | Status: AC

## 2024-06-08 NOTE — Patient Instructions (Addendum)
 Return in about 25 weeks (around 11/30/2024) for Routine chronic condition follow-up.         Great to see you today.  I have refilled the medication(s) we provide.   If labs were collected or images ordered, we will inform you of  results once we have received them and reviewed. We will contact you either by echart message, or telephone call.  Please give ample time to the testing facility, and our office to run,  receive and review results. Please do not call inquiring of results, even if you can see them in your chart. We will contact you as soon as we are able. If it has been over 1 week since the test was completed, and you have not yet heard from us , then please call us .    - echart message- for normal results that have been seen by the patient already.   - telephone call: abnormal results or if patient has not viewed results in their echart.  If a referral to a specialist was entered for you, please call us  in 2 weeks if you have not heard from the specialist office to schedule.

## 2024-06-08 NOTE — Progress Notes (Signed)
 [name] , 16-Feb-1966, 58 y.o., female MRN: [id number] Patient Care Team    Relationship Specialty Notifications Start End  Catherine Charlies LABOR, DO PCP - General Family Medicine  05/12/16   Chalice Keneth LABOR, DO Referring Physician   05/12/16    Comment: psych  Nandigam, Kavitha V, MD Consulting Physician Gastroenterology  06/07/23   Obgyn, Anna    06/07/23     Chief Complaint  Patient presents with   Annual Exam    Flu shot given     Subjective:Brenda Foster is a 58 y.o.  Pt presents for CPE and chronic Conditions/illness Management Medication reconciliation completed today.   Past medical history updated with any changes.  Health Maintenance: Colonoscopy: completed 12/2018 Dr. nandigam- rpt 5 yr.-scheduled for 07/07/2024 Mammogram: completed: 10/07/2023- [facility]> order placed for 2026 Cervical cancer screening: last pap:2020, results:normal> completed today Immunizations: tdap UTD 03/2024, Influenza-completed today(encouraged yearly), shingrix  completed, Prevnar-declined, hepatitis B-declined Infectious disease screening: HIV completed 2015; hep c completed DEXA: Recent fracture-DEXA ordered  Hyperlipidemia/fh heart disease: Pt made dietary changes- avoided sugar and fried foods.  Patient is compliant with Lipitor daily Exericse: walking but not routine exercise.   Anxiety/tremor/grief:.  Patient reports she feels her medications are working well for her.  She reports compliance with Prozac  40 mg daily and the propranolol  10 mg twice daily. She will occasionally take an ativan  when feeling more depressed or anxious.    Osteoarthritis: She reports compliance with diclofenac .  Medication helps her remain active and improved quality of life.  Review of Systems  Constitutional: Negative.   HENT: Negative.    Eyes: Negative.   Respiratory: Negative.    Cardiovascular: Negative.   Gastrointestinal: Negative.   Genitourinary: Negative.   Musculoskeletal:  Negative.   Skin: Negative.   Neurological: Negative.   Endo/Heme/Allergies: Negative.   Psychiatric/Behavioral: Negative.    All other systems reviewed and are negative.      03/20/2024   10:03 AM 11/30/2023    9:13 AM 11/30/2023    9:10 AM 06/08/2023    9:27 AM 12/01/2022   10:04 AM  Depression screen PHQ 2/9  Decreased Interest 0 0 0 0 0  Down, Depressed, Hopeless 0 0 0 1 1  PHQ - 2 Score 0 0 0 1 1  Altered sleeping 0 0 0 1 1  Tired, decreased energy 0 1 0 1 1  Change in appetite 0 0 0 2 0  Feeling bad or failure about yourself  0 1 0 1 1  Trouble concentrating 0 0 0 1 1  Moving slowly or fidgety/restless 0 0 0 0 0  Suicidal thoughts 0 0 0 0 0  PHQ-9 Score 0  2  0  7  5   Difficult doing work/chores Not difficult at all Not difficult at all Not difficult at all Not difficult at all      Data saved with a previous flowsheet row definition    Allergies  Allergen Reactions   Heparin     Do not give per MD   Sulfa Antibiotics Hives   Social History   Social History Narrative   Lives with husband and children. 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   Past Medical History:  Diagnosis Date   Anxiety    Depression    Mild n/ infertility   History of depression 12/22/2012   Treated for about 10 years w/prozac . Stopped about 2 ya.   History of miscarriage  Infertility    Interstitial cystitis    Low back pain    Migraines    Night sweats 12/15/2017   Sepsis (HCC) 12/2016   in hospital 1 week   Urinary tract bacterial infections    2x   Urine incontinence    Mild   Past Surgical History:  Procedure Laterality Date   APPENDECTOMY  1980   BREAST SURGERY  1986   Reduction   CERVICAL CONE BIOPSY  1990   CESAREAN SECTION  2003   1 time   COLONOSCOPY  09/30/2017   polyps   DILATION AND CURETTAGE OF UTERUS  1999-2002   HYSTEROSCOPY  2001   LAPAROSCOPY  2001   LASIK     REDUCTION MAMMAPLASTY Bilateral    TUBAL LIGATION  2003   WISDOM TOOTH EXTRACTION      Family History  Problem Relation Age of Onset   Anemia Mother    Cancer Father        prostate   Heart disease Father 13       MI, premature   Stroke Father        Mind   Prostate cancer Father    Stroke Maternal Grandmother    Stroke Maternal Grandfather    Autism spectrum disorder Daughter    Colon cancer Neg Hx    Esophageal cancer Neg Hx    Liver cancer Neg Hx    Allergies as of 06/08/2024       Reactions   Heparin    Do not give per MD   Sulfa Antibiotics Hives        Medication List        Accurate as of June 08, 2024  9:17 AM. If you have any questions, ask your nurse or doctor.          atorvastatin  10 MG tablet Commonly known as: LIPITOR Take 1 tablet (10 mg total) by mouth every evening.   diclofenac  75 MG EC tablet Commonly known as: VOLTAREN  Take 1 tablet (75 mg total) by mouth 2 (two) times daily.   FLUoxetine  40 MG capsule Commonly known as: PROZAC  Take 1 capsule (40 mg total) by mouth daily.   LORazepam  0.5 MG tablet Commonly known as: ATIVAN  Take 1 tablet (0.5 mg total) by mouth every 8 (eight) hours as needed for anxiety.   multivitamin tablet Take 1 tablet by mouth daily.   omeprazole  20 MG capsule Commonly known as: PRILOSEC TAKE 1 CAPSULE BY MOUTH EVERY DAY What changed:  when to take this reasons to take this   propranolol  10 MG tablet Commonly known as: INDERAL  Take 1 tablet (10 mg total) by mouth 2 (two) times daily. Needs appointment for additional refills.   tiZANidine  4 MG tablet Commonly known as: Zanaflex  Take 0.5-1 tablets (2-4 mg total) by mouth at bedtime.        All past medical history, surgical history, allergies, family history, immunizations andmedications were updated in the EMR today and reviewed under the history and medication portions of their EMR.     ROS: Negative, with the exception of above mentioned in HPI   Objective:  BP 110/64   Pulse 64   Temp 98.1 F (36.7 C)   Ht 5' 6  (1.676 m)   Wt 171 lb 9.6 oz (77.8 kg)   LMP 01/20/2016   SpO2 98%   BMI 27.70 kg/m  Body mass index is 27.7 kg/m. Physical Exam Vitals and nursing note reviewed.  Constitutional:  General: She is not in acute distress.    Appearance: Normal appearance. She is normal weight. She is not ill-appearing or toxic-appearing.  HENT:     Head: Normocephalic and atraumatic.     Right Ear: Tympanic membrane, ear canal and external ear normal. There is no impacted cerumen.     Left Ear: Tympanic membrane, ear canal and external ear normal. There is no impacted cerumen.     Nose: No congestion or rhinorrhea.     Mouth/Throat:     Mouth: Mucous membranes are moist.     Pharynx: Oropharynx is clear. No oropharyngeal exudate or posterior oropharyngeal erythema.  Eyes:     General: No scleral icterus.       Right eye: No discharge.        Left eye: No discharge.     Extraocular Movements: Extraocular movements intact.     Conjunctiva/sclera: Conjunctivae normal.     Pupils: Pupils are equal, round, and reactive to light.  Cardiovascular:     Rate and Rhythm: Normal rate and regular rhythm.     Pulses: Normal pulses.     Heart sounds: Normal heart sounds. No murmur heard.    No friction rub. No gallop.  Pulmonary:     Effort: Pulmonary effort is normal. No respiratory distress.     Breath sounds: Normal breath sounds. No stridor. No wheezing, rhonchi or rales.  Chest:     Chest wall: No tenderness.  Abdominal:     General: Abdomen is flat. Bowel sounds are normal. There is no distension.     Palpations: Abdomen is soft. There is no mass.     Tenderness: There is no abdominal tenderness. There is no right CVA tenderness, left CVA tenderness, guarding or rebound.     Hernia: No hernia is present.  Musculoskeletal:        General: No swelling, tenderness or deformity. Normal range of motion.     Cervical back: Normal range of motion and neck supple. No rigidity or tenderness.     Right  lower leg: No edema.     Left lower leg: No edema.  Lymphadenopathy:     Cervical: No cervical adenopathy.  Skin:    General: Skin is warm and dry.     Coloration: Skin is not jaundiced or pale.     Findings: No bruising, erythema, lesion or rash.  Neurological:     General: No focal deficit present.     Mental Status: She is alert and oriented to person, place, and time. Mental status is at baseline.     Cranial Nerves: No cranial nerve deficit.     Sensory: No sensory deficit.     Motor: No weakness.     Coordination: Coordination normal.     Gait: Gait normal.     Deep Tendon Reflexes: Reflexes normal.  Psychiatric:        Mood and Affect: Mood normal.        Behavior: Behavior normal.        Thought Content: Thought content normal.        Judgment: Judgment normal.   Breasts: breasts appear normal, symmetrical, no tenderness on exam, no suspicious masses, no skin or nipple changes or axillary nodes. GYN:  External genitalia within normal limits, normal hair distribution, no lesions. Urethral meatus normal, no lesions. Vaginal mucosa pink, moist, normal rugae, no lesions. No cystocele or rectocele. cervix without lesions, no discharge. Bimanual exam revealed normal uterus.  No bladder/suprapubic fullness,  masses or tenderness. No cervical motion tenderness. No adnexal fullness. Anus and perineum within normal limits, no lesions.   No results found. No results found. No results found for this or any previous visit (from the past 24 hours).  Assessment/Plan: Brenda Foster is a 58 y.o. female present for OV for CPE and chronic chronic Conditions/illness Management FH: heart disease/Hyperlipidemia LDL goal <100 Stable Continue lipitor 10 mg every evening RJR:ipdrlddzi and ordered- dwg Lipids collected today  Situational anxiety/Tremor/grief Stable Continue ativan  0.5mg  TID PRN. - [facility] database reviewed today Continue Prozac   40 mg QD Continue propanol 10 mg BID (tremor  2/2 to prozac ).  - referred to therapist    Osteoarthritis: Stable Continue diclofenac   Long-term proton pump inhibitor use: GERD managed by gastroenterology with omeprazole  20 mg daily B12, vitamin D  and magnesium levels collected today.  Monitoring recommended every 2 years  Estrogen deficiency/vitamin D  deficiency/recent fracture/osteoporosis screening Vitamin D  levels collected today. DEXA ordered-[facility]  Influenza vaccine needed - Flu vaccine trivalent PF, 6mos and older(Flulaval,Afluria,Fluarix,Fluzone) Need for vaccination for pneumococcus Wants to wait Diabetes mellitus screening - Hemoglobin A1c  Breast cancer screening by mammogram - MM 3D SCREENING MAMMOGRAM BILATERAL BREAST; Future  Encounter for gynecological examination with Papanicolaou smear of cervix - Cytology - PAP( [facility]) w cotest  Routine general medical examination at a health care facility (Primary) Patient was encouraged to exercise greater than 150 minutes a week. Patient was encouraged to choose a diet filled with fresh fruits and vegetables, and lean meats. AVS provided to patient today for education/recommendation on gender specific health and safety maintenance. Colonoscopy: completed 12/2018 Dr. nandigam- rpt 5 yr.-scheduled for 07/07/2024 Mammogram: completed: 10/07/2023- [facility]> order placed for 2026 Cervical cancer screening: last pap:2020, results:normal> completed today Immunizations: tdap UTD 03/2024, Influenza-completed today(encouraged yearly), shingrix  completed, Prevnar-declined, hepatitis B-declined Infectious disease screening: HIV completed 2015; hep c completed DEXA: Recent fracture-DEXA ordered    Reviewed expectations re: course of current medical issues. Discussed self-management of symptoms. Outlined signs and symptoms indicating need for more acute intervention. Patient verbalized understanding and all questions were answered. Patient received an After-Visit  Summary.    Orders Placed This Encounter  Procedures   MM 3D SCREENING MAMMOGRAM BILATERAL BREAST   DG Bone Density   CT CARDIAC SCORING (SELF PAY ONLY)   Flu vaccine trivalent PF, 6mos and older(Flulaval,Afluria,Fluarix,Fluzone)   CBC   Comprehensive metabolic panel with GFR   Hemoglobin A1c   Lipid panel   TSH   Vitamin D  (25 hydroxy)   Magnesium   B12 and Folate Panel   Meds ordered this encounter  Medications   diclofenac  (VOLTAREN ) 75 MG EC tablet    Sig: Take 1 tablet (75 mg total) by mouth 2 (two) times daily.    Dispense:  90 tablet    Refill:  1   FLUoxetine  (PROZAC ) 40 MG capsule    Sig: Take 1 capsule (40 mg total) by mouth daily.    Dispense:  90 capsule    Refill:  1   propranolol  (INDERAL ) 10 MG tablet    Sig: Take 1 tablet (10 mg total) by mouth 2 (two) times daily. Needs appointment for additional refills.    Dispense:  180 tablet    Refill:  1   atorvastatin  (LIPITOR) 10 MG tablet    Sig: Take 1 tablet (10 mg total) by mouth every evening.    Dispense:  90 tablet    Refill:  3   LORazepam  (ATIVAN ) 0.5 MG  tablet    Sig: Take 1 tablet (0.5 mg total) by mouth every 8 (eight) hours as needed for anxiety.    Dispense:  90 tablet    Refill:  5    Referral Orders  No referral(s) requested today       Note is dictated utilizing voice recognition software. Although note has been proof read prior to signing, occasional typographical errors still can be missed. If any questions arise, please do not hesitate to call for verification.   electronically signed by:  Charlies Bellini, DO  [facility] - [place]

## 2024-06-12 ENCOUNTER — Ambulatory Visit: Payer: Self-pay | Admitting: Family Medicine

## 2024-06-12 DIAGNOSIS — R931 Abnormal findings on diagnostic imaging of heart and coronary circulation: Secondary | ICD-10-CM

## 2024-06-12 LAB — CYTOLOGY - PAP
Chlamydia: NEGATIVE
Comment: NEGATIVE
Comment: NEGATIVE
Comment: NORMAL
Diagnosis: NEGATIVE
High risk HPV: NEGATIVE
Neisseria Gonorrhea: NEGATIVE

## 2024-06-12 MED ORDER — VITAMIN D (ERGOCALCIFEROL) 1.25 MG (50000 UNIT) PO CAPS: 50000.0000 [IU] | ORAL_CAPSULE | ORAL | 0 refills | Status: AC

## 2024-06-17 ENCOUNTER — Other Ambulatory Visit: Payer: Self-pay | Admitting: Family Medicine

## 2024-06-23 ENCOUNTER — Ambulatory Visit: Admitting: *Deleted

## 2024-06-23 ENCOUNTER — Ambulatory Visit (HOSPITAL_BASED_OUTPATIENT_CLINIC_OR_DEPARTMENT_OTHER)
Admission: RE | Admit: 2024-06-23 | Discharge: 2024-06-23 | Disposition: A | Discharge status: Home / Self Care | Payer: Self-pay | Source: Ambulatory Visit | Attending: Family Medicine | Admitting: Family Medicine

## 2024-06-23 VITALS — Ht 66.0 in | Wt 170.0 lb

## 2024-06-23 DIAGNOSIS — E785 Hyperlipidemia, unspecified: Secondary | ICD-10-CM | POA: Insufficient documentation

## 2024-06-23 DIAGNOSIS — Z860100 Personal history of colon polyps, unspecified: Secondary | ICD-10-CM

## 2024-06-23 DIAGNOSIS — Z8249 Family history of ischemic heart disease and other diseases of the circulatory system: Secondary | ICD-10-CM | POA: Insufficient documentation

## 2024-06-23 MED ORDER — NA SULFATE-K SULFATE-MG SULF 17.5-3.13-1.6 GM/177ML PO SOLN: 1.0000 | Freq: Once | ORAL | 0 refills | Status: AC

## 2024-06-23 NOTE — Progress Notes (Signed)
 Pt's name and DOB verified at the beginning of the pre-visit with 2 identifiers  Pt denies any difficulty with ambulating,sitting, laying down or rolling side to side  Pt has no issues moving head neck or swallowing  No egg or soy allergy known to patient   No issues known to pt with past sedation  No FH of Malignant Hyperthermia  Pt is not on home 02   Pt is not on blood thinners   Pt denies issues with constipation   Pt is not on dialysis  Pt denise any abnormal heart rhythms   Pt denies any upcoming cardiac testing  Patient's chart reviewed by Norleen Schillings CNRA prior to pre-visit and patient appropriate for the [facility].  Pre-visit completed and red dot placed by patient's name on their procedure day (on provider's schedule).    Visit by phone  Pt states weight is 170 lb  Pt given  both [facility] main # and MD on call # prior to instructions.  Informed pt to come in at the time discussed and is shown on PV instructions.  Pt instructed to use Single[employer] or GoodRx for a price reduction on prep  Instructed pt where to find PV instructions in My Chart  Instructed pt on all aspects of written instructions including med holds clothing to wear and foods to eat and not eat as well as after procedure legal restrictions and to call MD on call if needed.. Pt states understanding. Instructed pt to review instructions again prior to procedure and call main # given if has any questions or any issues. Pt states they will.

## 2024-06-26 ENCOUNTER — Other Ambulatory Visit: Payer: Self-pay | Admitting: Medical Genetics

## 2024-06-27 DIAGNOSIS — R931 Abnormal findings on diagnostic imaging of heart and coronary circulation: Secondary | ICD-10-CM | POA: Insufficient documentation

## 2024-06-27 MED ORDER — ATORVASTATIN CALCIUM 20 MG PO TABS: 20.0000 mg | ORAL_TABLET | Freq: Every evening | ORAL | 3 refills | Status: AC

## 2024-06-28 ENCOUNTER — Encounter: Payer: Self-pay | Admitting: Gastroenterology

## 2024-07-07 ENCOUNTER — Ambulatory Visit: Admitting: Gastroenterology

## 2024-07-07 ENCOUNTER — Encounter: Payer: Self-pay | Admitting: Gastroenterology

## 2024-07-07 VITALS — BP 95/55 | HR 65 | Temp 97.9°F | Resp 10 | Ht 66.0 in | Wt 170.0 lb

## 2024-07-07 DIAGNOSIS — K644 Residual hemorrhoidal skin tags: Secondary | ICD-10-CM

## 2024-07-07 DIAGNOSIS — Z860101 Personal history of adenomatous and serrated colon polyps: Secondary | ICD-10-CM

## 2024-07-07 DIAGNOSIS — K5730 Diverticulosis of large intestine without perforation or abscess without bleeding: Secondary | ICD-10-CM

## 2024-07-07 DIAGNOSIS — Z1211 Encounter for screening for malignant neoplasm of colon: Secondary | ICD-10-CM

## 2024-07-07 DIAGNOSIS — D123 Benign neoplasm of transverse colon: Secondary | ICD-10-CM

## 2024-07-07 DIAGNOSIS — K635 Polyp of colon: Secondary | ICD-10-CM | POA: Diagnosis not present

## 2024-07-07 DIAGNOSIS — K648 Other hemorrhoids: Secondary | ICD-10-CM

## 2024-07-07 DIAGNOSIS — Z860100 Personal history of colon polyps, unspecified: Secondary | ICD-10-CM

## 2024-07-07 MED ORDER — SODIUM CHLORIDE 0.9 % IV SOLN: 500.0000 mL | Freq: Once | INTRAVENOUS | Status: DC

## 2024-07-07 NOTE — Op Note (Signed)
 [facility] Patient Name: Brenda Foster Procedure Date: 07/07/2024 2:50 PM MRN: [id number] Endoscopist: Gustav ALONSO Mcgee , MD, [id number] Age: 59 Referring MD:  Date of Birth: Aug 11, 1965 Gender: Female Account #: [id number] Procedure:                Colonoscopy Indications:              High risk colon cancer surveillance: Personal                            history of sessile serrated colon polyp (10 mm or                            greater in size) Medicines:                Monitored Anesthesia Care Procedure:                Pre-Anesthesia Assessment:                           - Prior to the procedure, a History and Physical                            was performed, and patient medications and                            allergies were reviewed. The patient's tolerance of                            previous anesthesia was also reviewed. The risks                            and benefits of the procedure and the sedation                            options and risks were discussed with the patient.                            All questions were answered, and informed consent                            was obtained. Prior Anticoagulants: The patient has                            taken no anticoagulant or antiplatelet agents. ASA                            Grade Assessment: II - A patient with mild systemic                            disease. After reviewing the risks and benefits,                            the patient was deemed in satisfactory condition to  undergo the procedure.                           After obtaining informed consent, the colonoscope                            was passed under direct vision. Throughout the                            procedure, the patient's blood pressure, pulse, and                            oxygen saturations were monitored continuously. The                            Olympus Scope SN [id number] was  introduced through the                            anus and advanced to the the cecum, identified by                            appendiceal orifice and ileocecal valve. The                            colonoscopy was performed without difficulty. The                            patient tolerated the procedure well. The quality                            of the bowel preparation was good. The ileocecal                            valve, appendiceal orifice, and rectum were                            photographed. Scope In: 3:08:15 PM Scope Out: 3:19:29 PM Scope Withdrawal Time: 0 hours 7 minutes 20 seconds  Total Procedure Duration: 0 hours 11 minutes 14 seconds  Findings:                 The perianal and digital rectal examinations were                            normal.                           A 4 mm polyp was found in the transverse colon. The                            polyp was sessile. The polyp was removed with a                            cold snare. Resection and retrieval were complete.  Scattered large-mouthed, medium-mouthed and                            small-mouthed diverticula were found in the sigmoid                            colon and descending colon.                           Non-bleeding external and internal hemorrhoids were                            found during retroflexion. The hemorrhoids were                            medium-sized. Complications:            No immediate complications. Estimated Blood Loss:     Estimated blood loss was minimal. Impression:               - One 4 mm polyp in the transverse colon, removed                            with a cold snare. Resected and retrieved.                           - Diverticulosis in the sigmoid colon and in the                            descending colon.                           - Non-bleeding external and internal hemorrhoids. Recommendation:           - Resume previous diet.                            - Continue present medications.                           - Await pathology results.                           - Repeat colonoscopy in 5 years for surveillance                            based on pathology results. Kodi Guerrera V. Roverto Bodmer, MD 07/07/2024 3:24:44 PM This report has been signed electronically.

## 2024-07-07 NOTE — Patient Instructions (Addendum)
-   Resume previous diet. - Continue present medications. - Await pathology results. - Repeat colonoscopy in 5 years for surveillance based on pathology results.  YOU HAD AN ENDOSCOPIC PROCEDURE TODAY AT THE [facility]:   Refer to the procedure report that was given to you for any specific questions about what was found during the examination.  If the procedure report does not answer your questions, please call your gastroenterologist to clarify.  If you requested that your care partner not be given the details of your procedure findings, then the procedure report has been included in a sealed envelope for you to review at your convenience later.  YOU SHOULD EXPECT: Some feelings of bloating in the abdomen. Passage of more gas than usual.  Walking can help get rid of the air that was put into your GI tract during the procedure and reduce the bloating. If you had a lower endoscopy (such as a colonoscopy or flexible sigmoidoscopy) you may notice spotting of blood in your stool or on the toilet paper. If you underwent a bowel prep for your procedure, you may not have a normal bowel movement for a few days.  Please Note:  You might notice some irritation and congestion in your nose or some drainage.  This is from the oxygen used during your procedure.  There is no need for concern and it should clear up in a day or so.  SYMPTOMS TO REPORT IMMEDIATELY:  Following lower endoscopy (colonoscopy or flexible sigmoidoscopy):  Excessive amounts of blood in the stool  Significant tenderness or worsening of abdominal pains  Swelling of the abdomen that is new, acute  Fever of 100F or higher  For urgent or emergent issues, a gastroenterologist can be reached at any hour by calling [phone]. Do not use MyChart messaging for urgent concerns.    DIET:  We do recommend a small meal at first, but then you may proceed to your regular diet.  Drink plenty of fluids but you should avoid alcoholic  beverages for 24 hours.  ACTIVITY:  You should plan to take it easy for the rest of today and you should NOT DRIVE or use heavy machinery until tomorrow (because of the sedation medicines used during the test).    FOLLOW UP: Our staff will call the number listed on your records the next business day following your procedure.  We will call around 7:15- 8:00 am to check on you and address any questions or concerns that you may have regarding the information given to you following your procedure. If we do not reach you, we will leave a message.     If any biopsies were taken you will be contacted by phone or by letter within the next 1-3 weeks.  Please call us at [phone] if you have not heard about the biopsies in 3 weeks.    SIGNATURES/CONFIDENTIALITY: You and/or your care partner have signed paperwork which will be entered into your electronic medical record.  These signatures attest to the fact that that the information above on your After Visit Summary has been reviewed and is understood.  Full responsibility of the confidentiality of this discharge information lies with you and/or your care-partner.

## 2024-07-07 NOTE — Progress Notes (Signed)
 Pt's states no medical or surgical changes since previsit or office visit.

## 2024-07-07 NOTE — Progress Notes (Signed)
 Report to PACU, RN, vss, BBS= Clear.

## 2024-07-07 NOTE — Progress Notes (Signed)
 [facility] History and Physical   Primary Care Physician:  Catherine Charlies LABOR, DO   Reason for Procedure:  History of adenomatous colon polyps  Plan:    Surveillance colonoscopy with possible interventions as needed     HPI: Brenda Foster is a very pleasant 59 y.o. female here for surveillance colonoscopy. Denies any nausea, vomiting, abdominal pain, melena or bright red blood per rectum  The risks and benefits as well as alternatives of endoscopic procedure(s) have been discussed and reviewed.  The patient was provided an opportunity to ask questions and all were answered. The patient agreed with the plan and demonstrated an understanding of the instructions.   Past Medical History:  Diagnosis Date   Anxiety    Arthritis    Depression    Mild n/ infertility   History of depression 12/22/2012   Treated for about 10 years w/prozac . Stopped about 2 ya.   History of miscarriage    Hyperlipidemia    Infertility    Interstitial cystitis    Low back pain    Migraines    Night sweats 12/15/2017   Sepsis (HCC) 12/2016   in hospital 1 week   Urinary tract bacterial infections    2x   Urine incontinence    Mild    Past Surgical History:  Procedure Laterality Date   APPENDECTOMY  1980   BREAST SURGERY  1986   Reduction   CERVICAL CONE BIOPSY  1990   CESAREAN SECTION  2003   1 time   COLONOSCOPY  09/30/2017   polyps   DILATION AND CURETTAGE OF UTERUS  1999-2002   HYSTEROSCOPY  2001   LAPAROSCOPY  2001   LASIK     REDUCTION MAMMAPLASTY Bilateral    TUBAL LIGATION  2003   WISDOM TOOTH EXTRACTION      Prior to Admission medications  Medication Sig Start Date End Date Taking? Authorizing Provider  atorvastatin  (LIPITOR) 20 MG tablet Take 1 tablet (20 mg total) by mouth every evening. 06/27/24  Yes Kuneff, Renee A, DO  FLUoxetine  (PROZAC ) 40 MG capsule Take 1 capsule (40 mg total) by mouth daily. 06/08/24  Yes Kuneff, Renee A, DO  LORazepam  (ATIVAN ) 0.5  MG tablet Take 1 tablet (0.5 mg total) by mouth every 8 (eight) hours as needed for anxiety. 06/08/24  Yes Kuneff, Renee A, DO  propranolol  (INDERAL ) 10 MG tablet Take 1 tablet (10 mg total) by mouth 2 (two) times daily. Needs appointment for additional refills. 06/08/24  Yes Kuneff, Renee A, DO  diclofenac  (VOLTAREN ) 75 MG EC tablet TAKE 1 TABLET BY MOUTH TWICE A DAY 06/19/24   Kuneff, Renee A, DO  ergocalciferol  (VITAMIN D2) 1.25 MG (50000 UT) capsule Take 50,000 Units by mouth once a week.    [provider]  Multiple Vitamin (MULTIVITAMIN) tablet Take 1 tablet by mouth daily.    [provider]  omeprazole  (PRILOSEC) 20 MG capsule TAKE 1 CAPSULE BY MOUTH EVERY DAY Patient taking differently: Take by mouth as needed. 05/29/24   Bera Pinela V, MD  tiZANidine  (ZANAFLEX ) 4 MG tablet Take 0.5-1 tablets (2-4 mg total) by mouth at bedtime. Patient not taking: Reported on 06/23/2024 03/09/24   Catherine Charlies A, DO  Vitamin D , Ergocalciferol , (DRISDOL ) 1.25 MG (50000 UNIT) CAPS capsule Take 1 capsule (50,000 Units total) by mouth every 7 (seven) days. 06/12/24   Kuneff, Renee A, DO    Current Outpatient Medications  Medication Sig Dispense Refill   atorvastatin  (LIPITOR) 20 MG  tablet Take 1 tablet (20 mg total) by mouth every evening. 90 tablet 3   FLUoxetine  (PROZAC ) 40 MG capsule Take 1 capsule (40 mg total) by mouth daily. 90 capsule 1   LORazepam  (ATIVAN ) 0.5 MG tablet Take 1 tablet (0.5 mg total) by mouth every 8 (eight) hours as needed for anxiety. 90 tablet 5   propranolol  (INDERAL ) 10 MG tablet Take 1 tablet (10 mg total) by mouth 2 (two) times daily. Needs appointment for additional refills. 180 tablet 1   diclofenac  (VOLTAREN ) 75 MG EC tablet TAKE 1 TABLET BY MOUTH TWICE A DAY 180 tablet 0   ergocalciferol  (VITAMIN D2) 1.25 MG (50000 UT) capsule Take 50,000 Units by mouth once a week.     Multiple Vitamin (MULTIVITAMIN) tablet Take 1 tablet by mouth daily.     omeprazole   (PRILOSEC) 20 MG capsule TAKE 1 CAPSULE BY MOUTH EVERY DAY (Patient taking differently: Take by mouth as needed.) 90 capsule 1   tiZANidine  (ZANAFLEX ) 4 MG tablet Take 0.5-1 tablets (2-4 mg total) by mouth at bedtime. (Patient not taking: Reported on 06/23/2024) 30 tablet 0   Vitamin D , Ergocalciferol , (DRISDOL ) 1.25 MG (50000 UNIT) CAPS capsule Take 1 capsule (50,000 Units total) by mouth every 7 (seven) days. 12 capsule 0   Current Facility-Administered Medications  Medication Dose Route Frequency Provider Last Rate Last Admin   0.9 %  sodium chloride  infusion  500 mL Intravenous Once Sahid Borba V, MD        Allergies as of 07/07/2024 - Review Complete 07/07/2024  Allergen Reaction Noted   Heparin  01/07/2017   Sulfa antibiotics Hives 12/22/2012    Family History  Problem Relation Age of Onset   Anemia Mother    Cancer Father        prostate   Heart disease Father 61       MI, premature   Stroke Father        Mind   Prostate cancer Father    Stroke Maternal Grandmother    Stroke Maternal Grandfather    Autism spectrum disorder Daughter    Colon cancer Neg Hx    Esophageal cancer Neg Hx    Liver cancer Neg Hx    Colon polyps Neg Hx    Rectal cancer Neg Hx    Stomach cancer Neg Hx     Social History   Socioeconomic History   Marital status: Married    Spouse name: Not on file   Number of children: 4   Years of education: Not on file   Highest education level: Not on file  Occupational History   Occupation: school psychologist    Employer: [employer]  Tobacco Use   Smoking status: Never   Smokeless tobacco: Never  Vaping Use   Vaping status: Never Used  Substance and Sexual Activity   Alcohol  use: Yes    Alcohol /week: 7.0 - 10.0 standard drinks of alcohol     Types: 7 - 10 Glasses of wine per week    Comment: occ   Drug use: No   Sexual activity: Yes    Partners: Male    Birth control/protection: Surgical  Other Topics Concern   Not on file   Social History Narrative   Lives with husband and children. 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   Social Drivers of Health   Tobacco Use: Low Risk (07/07/2024)   Patient History    Smoking Tobacco Use: Never    Smokeless Tobacco Use: Never  Passive Exposure: Not on file  Financial Resource Strain: Low Risk (04/11/2024)   Received from [facility]   Overall Financial Resource Strain (CARDIA)    How hard is it for you to pay for the very basics like food, housing, medical care, and heating?: Not hard at all  Food Insecurity: No Food Insecurity (04/11/2024)   Received from [facility]   Epic    Within the past 12 months, you worried that your food would run out before you got the money to buy more.: Never true    Within the past 12 months, the food you bought just didn't last and you didn't have money to get more.: Never true  Transportation Needs: No Transportation Needs (04/11/2024)   Received from [facility]    In the past 12 months, has lack of transportation kept you from medical appointments or from getting medications?: No    In the past 12 months, has lack of transportation kept you from meetings, work, or from getting things needed for daily living?: No  Physical Activity: Not on file  Stress: Not on file  Social Connections: Not on file  Intimate Partner Violence: Not on file  Depression (PHQ2-9): Low Risk (03/20/2024)   Depression (PHQ2-9)    PHQ-2 Score: 0  Alcohol  Screen: Not on file  Housing: Low Risk (04/11/2024)   Received [facility]    In the last 12 months, was there a time when you were not able to pay the mortgage or rent on time?: No    In the past 12 months, how many times have you moved where you were living?: 0    At any time in the past 12 months, were you homeless or living in a shelter (including now)?: No  Utilities: Not At Risk (04/11/2024)   Received from [facility]    In the past 12 months has the electric,  gas, oil, or water company threatened to shut off services in your home?: No  Health Literacy: Not on file    Review of Systems:  All other review of systems negative except as mentioned in the HPI.  Physical Exam: Vital signs in last 24 hours: BP 109/68   Pulse 75   Temp 97.9 F (36.6 C) (Skin)   Ht 5' 6 (1.676 m)   Wt 170 lb (77.1 kg)   LMP 01/20/2016   SpO2 99%   BMI 27.44 kg/m  General:   Alert, NAD Lungs:  Clear .   Heart:  Regular rate and rhythm Abdomen:  Soft, nontender and nondistended. Neuro/Psych:  Alert and cooperative. Normal mood and affect. A and O x 3  Reviewed labs, radiology imaging, old records and pertinent past GI work up  Patient is appropriate for planned procedure(s) and anesthesia in an ambulatory setting   K. Veena Garrie Elenes , MD [phone]

## 2024-07-07 NOTE — Progress Notes (Signed)
 Called to room to assist during endoscopic procedure.  Patient ID and intended procedure confirmed with present staff. Received instructions for my participation in the procedure from the performing physician.

## 2024-07-10 ENCOUNTER — Telehealth: Payer: Self-pay | Admitting: *Deleted

## 2024-07-10 NOTE — Telephone Encounter (Signed)
" °  Follow up Call-     07/07/2024    2:48 PM  Call back number  Post procedure Call Back phone  # [phone]  Permission to leave phone message Yes     Patient questions:  Do you have a fever, pain , or abdominal swelling? No. Pain Score  0 *  Have you tolerated food without any problems? Yes.    Have you been able to return to your normal activities? Yes.    Do you have any questions about your discharge instructions: Diet   No. Medications  No. Follow up visit  No.  Do you have questions or concerns about your Care? No.  Actions: * If pain score is 4 or above: No action needed, pain <4.   "

## 2024-07-12 LAB — SURGICAL PATHOLOGY

## 2024-07-14 ENCOUNTER — Ambulatory Visit: Payer: Self-pay | Admitting: Gastroenterology

## 2024-08-01 ENCOUNTER — Other Ambulatory Visit: Payer: Self-pay

## 2024-12-06 ENCOUNTER — Ambulatory Visit: Admitting: Family Medicine
